# Patient Record
Sex: Female | Born: 1935
Health system: Southern US, Community
[De-identification: ages and names within clinical notes are randomized; demographics above are authoritative.]

## PROBLEM LIST (undated history)

## (undated) DIAGNOSIS — J45909 Unspecified asthma, uncomplicated: Secondary | ICD-10-CM

## (undated) DIAGNOSIS — I639 Cerebral infarction, unspecified: Secondary | ICD-10-CM

## (undated) DIAGNOSIS — H3530 Unspecified macular degeneration: Secondary | ICD-10-CM

## (undated) DIAGNOSIS — E119 Type 2 diabetes mellitus without complications: Secondary | ICD-10-CM

## (undated) DIAGNOSIS — J449 Chronic obstructive pulmonary disease, unspecified: Secondary | ICD-10-CM

## (undated) DIAGNOSIS — I10 Essential (primary) hypertension: Secondary | ICD-10-CM

## (undated) HISTORY — PX: ABDOMINAL HYSTERECTOMY: SHX81

## (undated) HISTORY — PX: HIP ARTHROPLASTY: SHX981

---

## 2003-12-23 ENCOUNTER — Ambulatory Visit: Payer: Self-pay | Admitting: Internal Medicine

## 2004-04-21 ENCOUNTER — Ambulatory Visit: Payer: Self-pay | Admitting: Urology

## 2004-11-09 ENCOUNTER — Ambulatory Visit: Payer: Self-pay | Admitting: Urology

## 2004-11-16 ENCOUNTER — Ambulatory Visit: Payer: Self-pay | Admitting: Unknown Physician Specialty

## 2004-12-31 ENCOUNTER — Ambulatory Visit: Payer: Self-pay | Admitting: Internal Medicine

## 2005-07-14 ENCOUNTER — Ambulatory Visit: Payer: Self-pay | Admitting: Urology

## 2005-09-27 ENCOUNTER — Ambulatory Visit: Payer: Self-pay | Admitting: Urology

## 2005-09-30 ENCOUNTER — Ambulatory Visit: Payer: Self-pay | Admitting: Urology

## 2005-09-30 ENCOUNTER — Other Ambulatory Visit: Payer: Self-pay

## 2005-10-07 ENCOUNTER — Ambulatory Visit: Payer: Self-pay | Admitting: Urology

## 2005-10-20 ENCOUNTER — Ambulatory Visit: Payer: Self-pay | Admitting: Urology

## 2006-01-10 ENCOUNTER — Ambulatory Visit: Payer: Self-pay | Admitting: Internal Medicine

## 2006-01-21 ENCOUNTER — Ambulatory Visit: Payer: Self-pay | Admitting: Urology

## 2006-01-26 ENCOUNTER — Ambulatory Visit: Payer: Self-pay | Admitting: Internal Medicine

## 2006-07-20 ENCOUNTER — Ambulatory Visit: Payer: Self-pay | Admitting: Urology

## 2006-07-20 ENCOUNTER — Ambulatory Visit: Payer: Self-pay | Admitting: Specialist

## 2006-11-10 IMAGING — CR DG ABDOMEN 1V
1 series · 1 of 1 positions shown · non-contrast
Comparison: none

REASON FOR EXAM: NEPHROLITHIASIS PT NEED FILMS
COMMENTS:

[view not recorded]
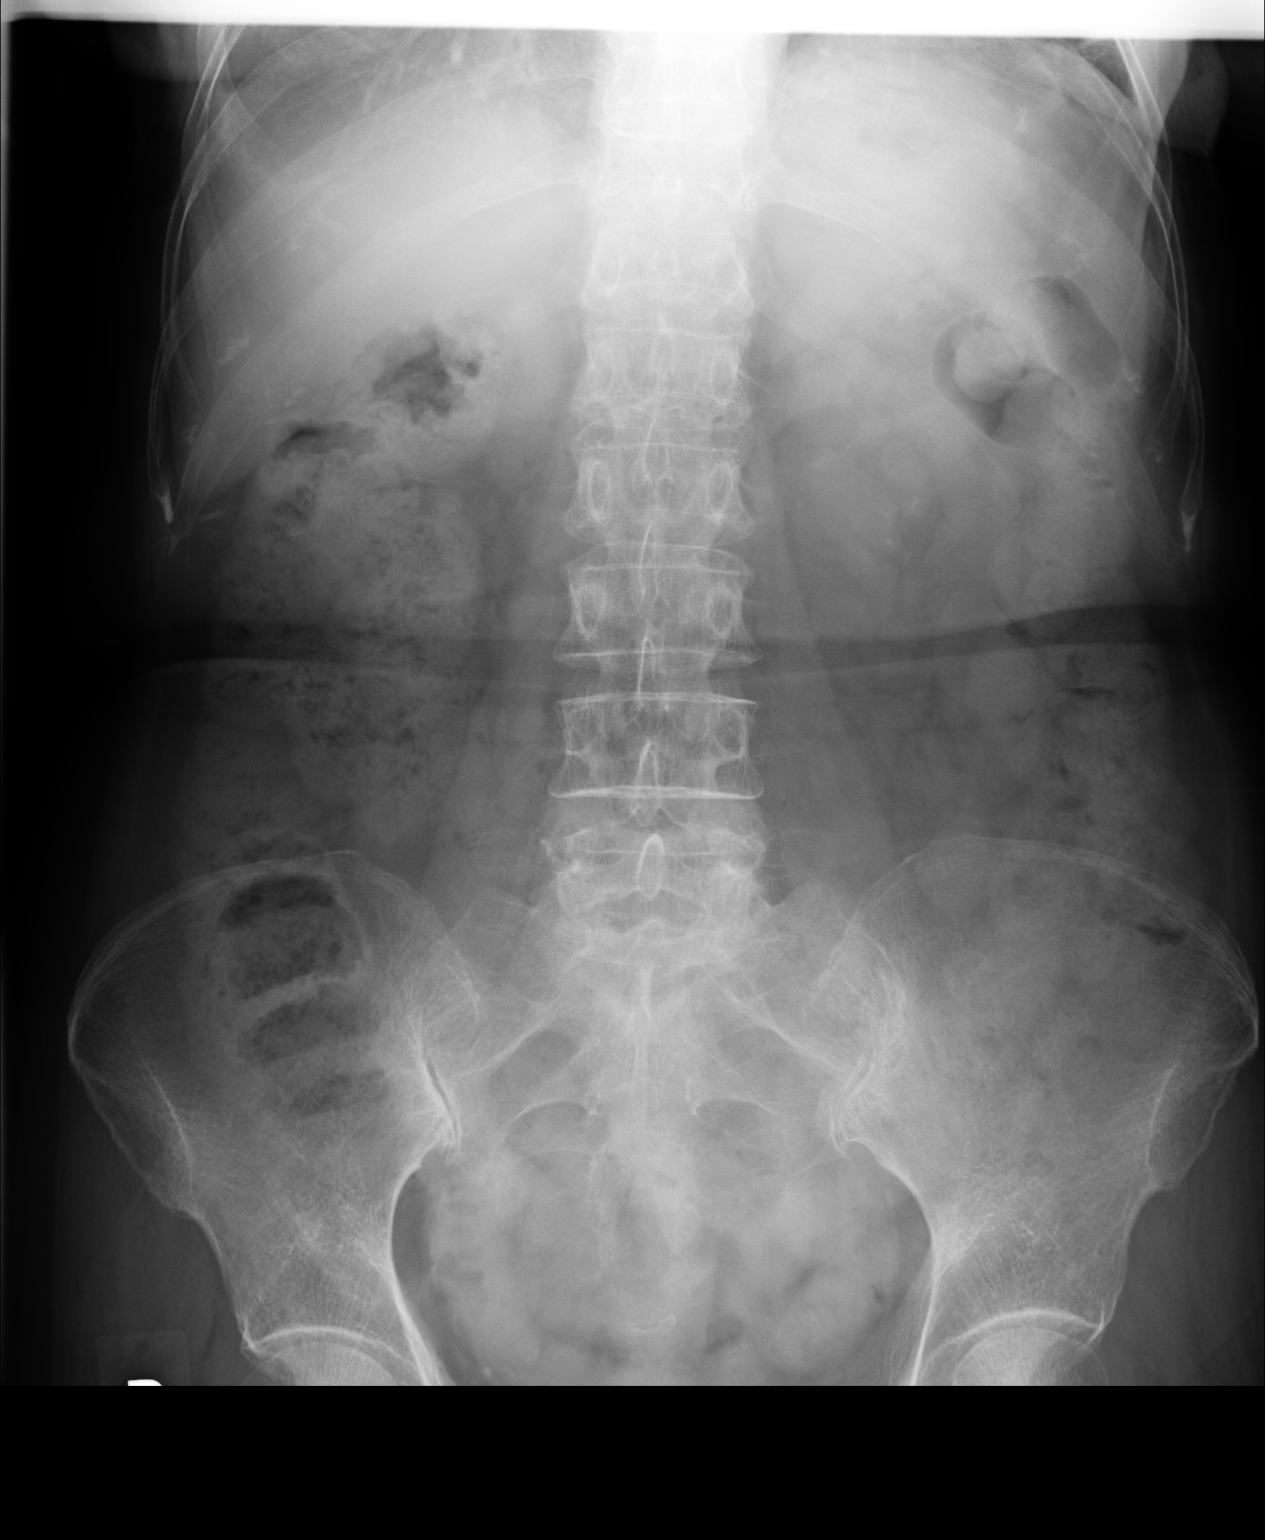

[1 of 1 positions shown; findings below may reference images not displayed]

PROCEDURE:     DXR - DXR KIDNEY URETER BLADDER  - October 20, 2005  [DATE]

RESULT:         The current exam is compared to a prior exam of 10/07/05.
The previously noted calcification at the lower pole of the LEFT kidney is
no longer seen.  There are noted a few sand-like calcifications at this time
presumably representing fragments from interval lithotripsy.   No other
calcifications are seen.  A few phleboliths are noted in the pelvis.
IMPRESSION: There are noted a few sand-like calcific densities at the lower pole of the
LEFT kidney.  No other calcifications are identified.

## 2006-12-21 ENCOUNTER — Emergency Department: Payer: Self-pay | Admitting: Emergency Medicine

## 2007-04-20 ENCOUNTER — Ambulatory Visit: Payer: Self-pay | Admitting: Urology

## 2007-05-17 ENCOUNTER — Ambulatory Visit: Payer: Self-pay | Admitting: Urology

## 2007-05-22 ENCOUNTER — Other Ambulatory Visit: Payer: Self-pay

## 2007-05-22 ENCOUNTER — Ambulatory Visit: Payer: Self-pay | Admitting: Urology

## 2007-05-23 ENCOUNTER — Ambulatory Visit: Payer: Self-pay | Admitting: Urology

## 2007-06-09 ENCOUNTER — Inpatient Hospital Stay: Payer: Self-pay | Admitting: Urology

## 2007-07-20 ENCOUNTER — Ambulatory Visit: Payer: Self-pay | Admitting: Urology

## 2008-01-29 ENCOUNTER — Ambulatory Visit: Payer: Self-pay | Admitting: Urology

## 2008-03-20 ENCOUNTER — Inpatient Hospital Stay: Payer: Self-pay | Admitting: Specialist

## 2008-05-30 ENCOUNTER — Ambulatory Visit: Payer: Self-pay | Admitting: Gastroenterology

## 2008-06-25 ENCOUNTER — Ambulatory Visit: Payer: Self-pay | Admitting: Gastroenterology

## 2008-08-22 ENCOUNTER — Ambulatory Visit: Payer: Self-pay | Admitting: Urology

## 2009-03-31 ENCOUNTER — Ambulatory Visit: Payer: Self-pay | Admitting: Specialist

## 2009-04-09 ENCOUNTER — Emergency Department: Payer: Self-pay | Admitting: Internal Medicine

## 2009-04-12 IMAGING — RF DG UGI W/ SMALL BOWEL
1 series · 14 of 24 positions shown · non-contrast
Comparison: none

REASON FOR EXAM: GI blood loss, abnormal proximal duodenum.
COMMENTS:

[Series 1: run · 14 of 40 slices shown]
[im 1/40]
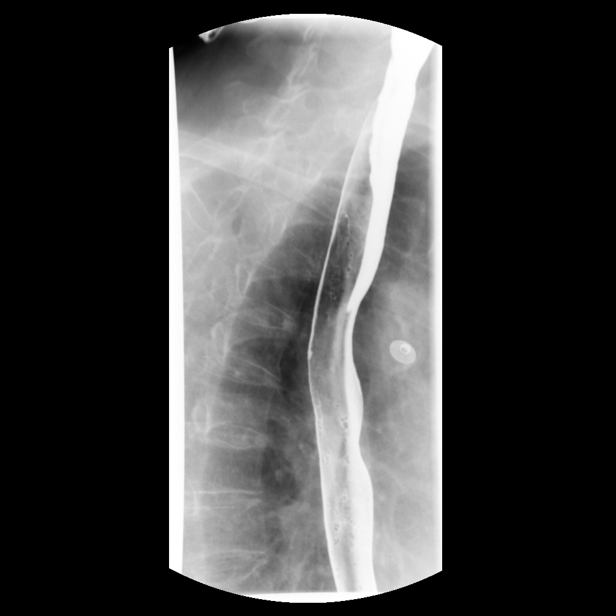
[im 4/40]
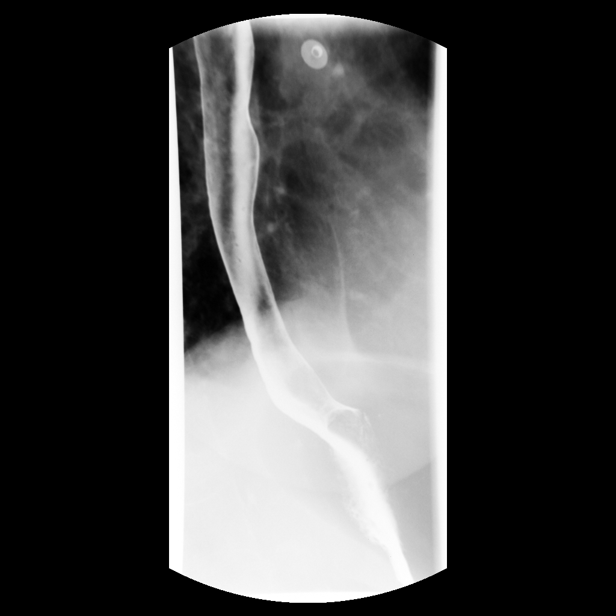
[im 7/40]
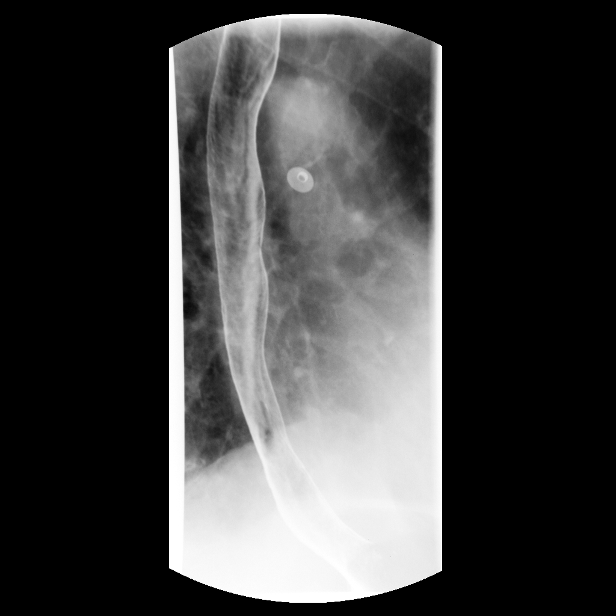
[im 11/40]
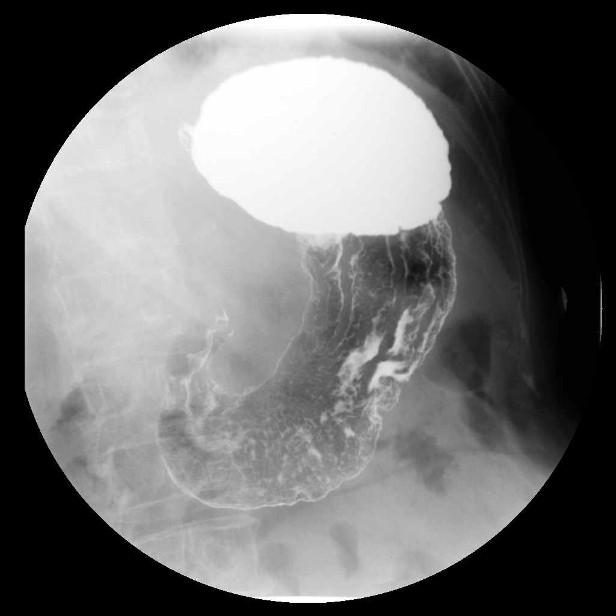
[im 12/40]
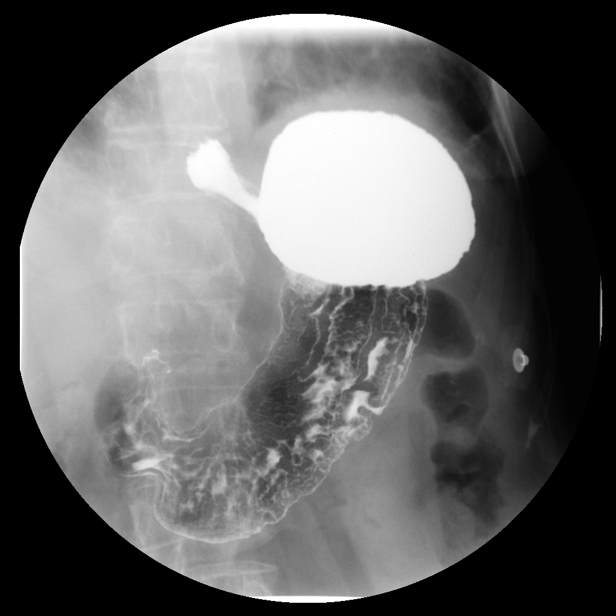
[im 16/40]
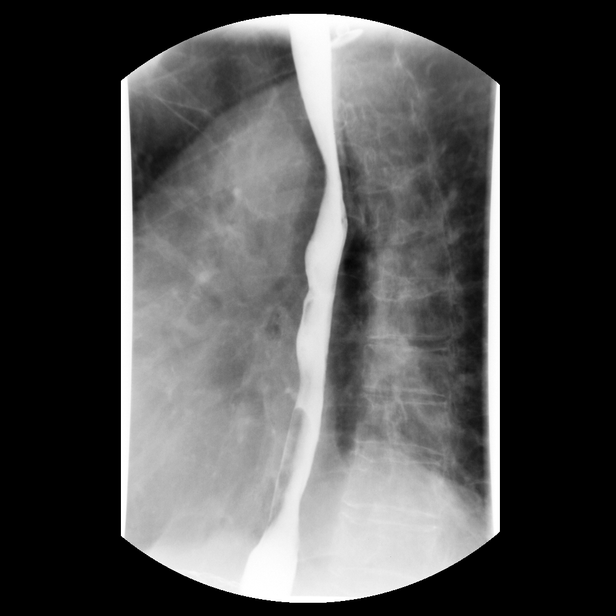
[im 19/40]
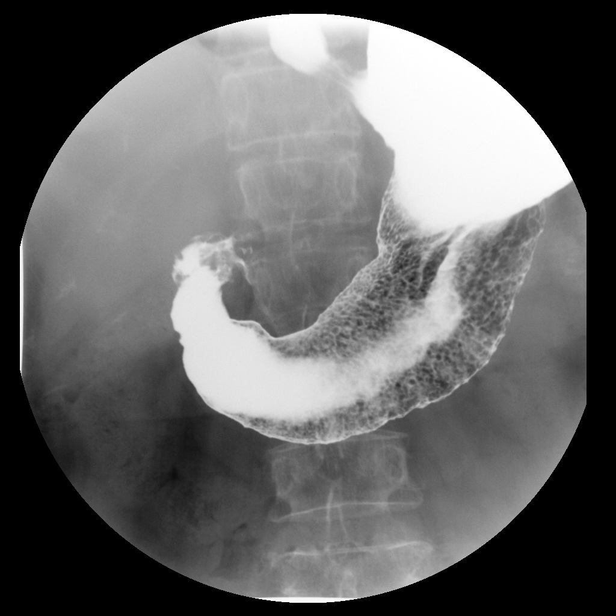
[im 21/40]
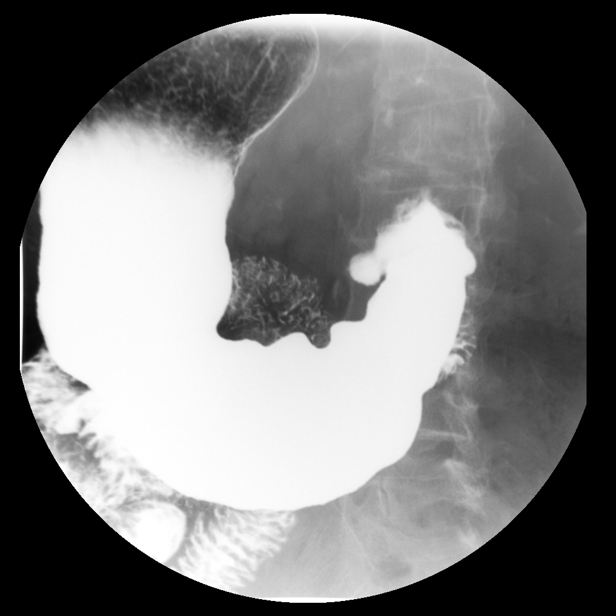
[im 24/40]
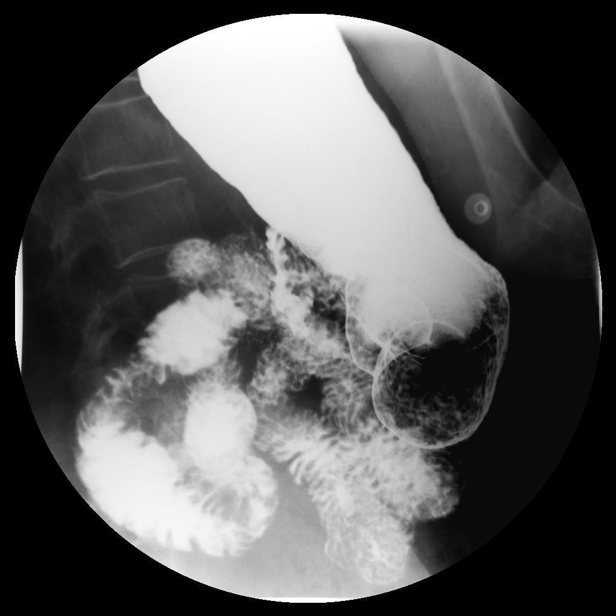
[im 28/40]
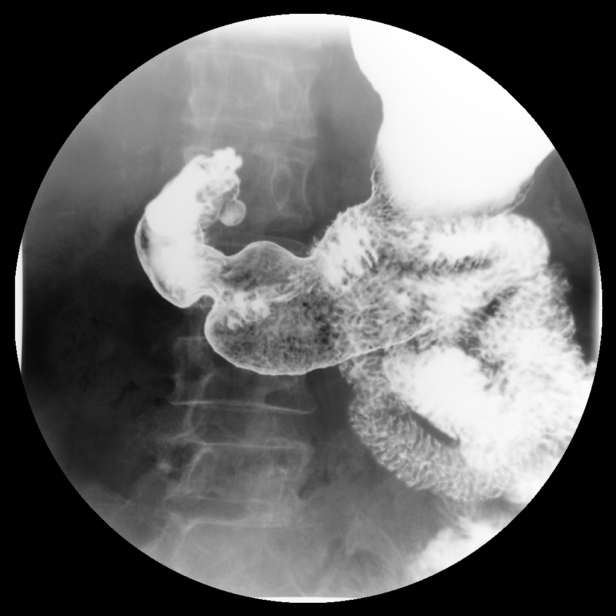
[im 31/40]
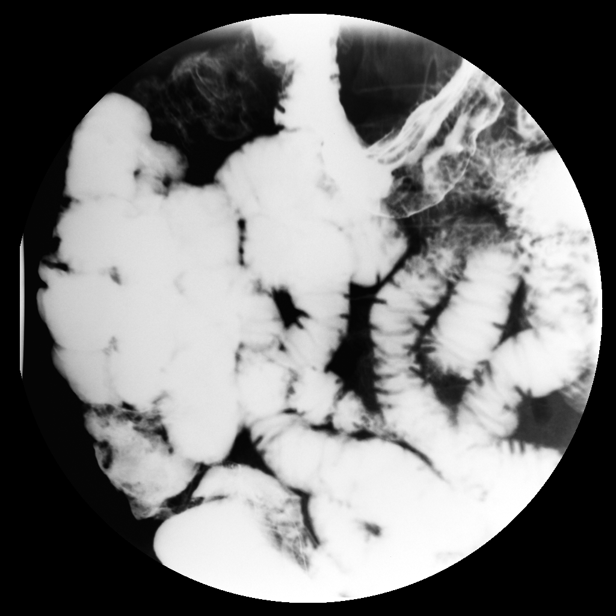
[im 33/40]
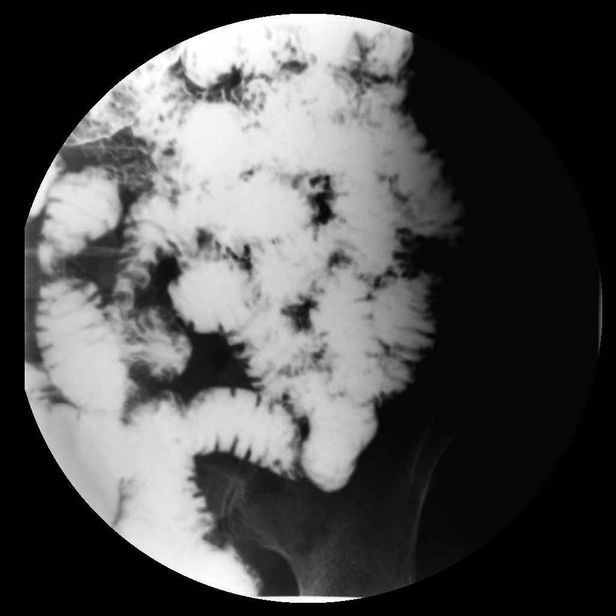
[im 36/40]
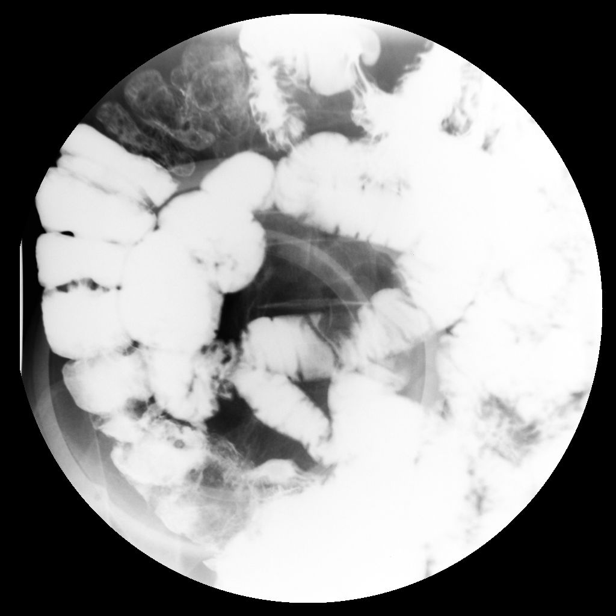
[im 40/40]
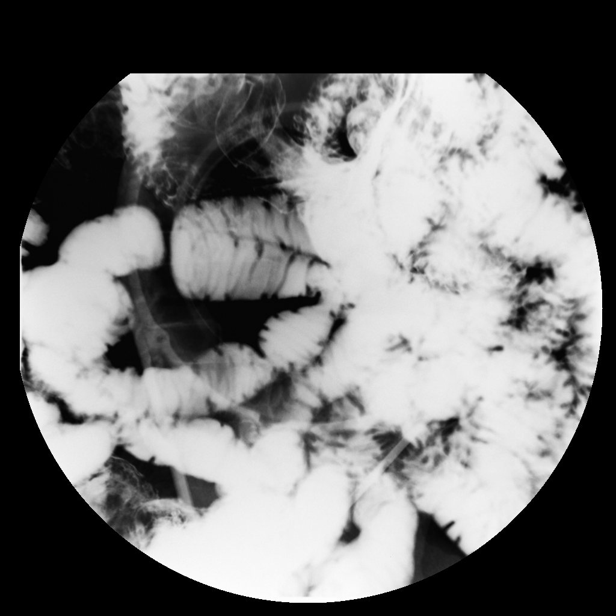

[14 of 24 positions shown; findings below may reference images not displayed]

PROCEDURE:     FL  - FL UPPER GI WITH SMALL BOWEL  - March 22, 2008 [DATE]

RESULT:        An upper GI evaluation was performed status post
administration of effervescent crystals followed by oral administration of
barium.  Limited evaluation of the esophagus demonstrates no gross
abnormalities.  There is appropriate relaxation of the lower esophageal
sphincter.  The stomach and duodenum demonstrate no gross abnormalities.  A
focal outpouching is identified along the medial posterior aspect of the
duodenal bulb.  This has a smoothly marginated border and has the appearance
of a diverticulum.  A small ulcer is also a diagnostic consideration.  There
is no further focal abnormalities identified within the duodenum. There is
appropriate rotation of the duodenum and placement of the ligament of
Treitz.  The oral barium column was then followed through the small bowel.
Transient time to the terminal ileum is 30 minutes.  Focused quadrant
evaluation of the small bowel demonstrates no radiographic abnormalities.
Evaluation of the terminal ileum demonstrates appropriate distention and
contraction without focal abnormalities.
IMPRESSION: 1.     Findings likely representing a small diverticulum involving the
duodenal bulb.  An encapsulated ulcer cannot be completely excluded.
2.     No further abnormalities are identified.

## 2009-04-14 ENCOUNTER — Ambulatory Visit: Payer: Self-pay | Admitting: Gastroenterology

## 2009-08-18 ENCOUNTER — Ambulatory Visit: Payer: Self-pay | Admitting: Urology

## 2010-04-15 ENCOUNTER — Ambulatory Visit: Payer: Self-pay | Admitting: Specialist

## 2012-10-03 ENCOUNTER — Ambulatory Visit: Payer: Self-pay | Admitting: Urology

## 2012-11-09 ENCOUNTER — Ambulatory Visit: Payer: Self-pay

## 2013-08-16 ENCOUNTER — Inpatient Hospital Stay: Payer: Self-pay | Admitting: Specialist

## 2013-08-16 LAB — CBC
HCT: 30.2 % — ABNORMAL LOW (ref 35.0–47.0)
HGB: 9.9 g/dL — ABNORMAL LOW (ref 12.0–16.0)
MCH: 30.5 pg (ref 26.0–34.0)
MCHC: 32.8 g/dL (ref 32.0–36.0)
MCV: 93 fL (ref 80–100)
PLATELETS: 171 10*3/uL (ref 150–440)
RBC: 3.25 10*6/uL — ABNORMAL LOW (ref 3.80–5.20)
RDW: 15 % — ABNORMAL HIGH (ref 11.5–14.5)
WBC: 13.6 10*3/uL — ABNORMAL HIGH (ref 3.6–11.0)

## 2013-08-16 LAB — BASIC METABOLIC PANEL
ANION GAP: 9 (ref 7–16)
BUN: 15 mg/dL (ref 7–18)
CALCIUM: 8.4 mg/dL — AB (ref 8.5–10.1)
CO2: 27 mmol/L (ref 21–32)
Chloride: 105 mmol/L (ref 98–107)
Creatinine: 0.71 mg/dL (ref 0.60–1.30)
EGFR (African American): >60
EGFR (Non-African Amer.): >60
Glucose: 343 mg/dL — ABNORMAL HIGH (ref 65–99)
Osmolality: 296 (ref 275–301)
POTASSIUM: 3.8 mmol/L (ref 3.5–5.1)
Sodium: 141 mmol/L (ref 136–145)

## 2013-08-17 LAB — BASIC METABOLIC PANEL
ANION GAP: 9 (ref 7–16)
BUN: 15 mg/dL (ref 7–18)
CALCIUM: 8 mg/dL — AB (ref 8.5–10.1)
CREATININE: 0.56 mg/dL — AB (ref 0.60–1.30)
Chloride: 106 mmol/L (ref 98–107)
Co2: 28 mmol/L (ref 21–32)
Glucose: 208 mg/dL — ABNORMAL HIGH (ref 65–99)
OSMOLALITY: 292 (ref 275–301)
POTASSIUM: 4.3 mmol/L (ref 3.5–5.1)
SODIUM: 143 mmol/L (ref 136–145)

## 2013-08-17 LAB — URINALYSIS, COMPLETE
BILIRUBIN, UR: NEGATIVE
BLOOD: NEGATIVE
Glucose,UR: >=500 mg/dL (ref 0–75)
Leukocyte Esterase: NEGATIVE
NITRITE: NEGATIVE
Ph: 6 (ref 4.5–8.0)
Protein: NEGATIVE
Specific Gravity: 1.015 (ref 1.003–1.030)
Transitional Epi: 1
WBC UR: 3 /HPF (ref 0–5)

## 2013-08-17 LAB — CBC WITH DIFFERENTIAL/PLATELET
BASOS ABS: 0 10*3/uL (ref 0.0–0.1)
BASOS PCT: 0.3 %
EOS PCT: 0 %
Eosinophil #: 0 10*3/uL (ref 0.0–0.7)
HCT: 23.9 % — ABNORMAL LOW (ref 35.0–47.0)
HGB: 7.7 g/dL — AB (ref 12.0–16.0)
LYMPHS ABS: 0.7 10*3/uL — AB (ref 1.0–3.6)
LYMPHS PCT: 8.4 %
MCH: 30.1 pg (ref 26.0–34.0)
MCHC: 32.1 g/dL (ref 32.0–36.0)
MCV: 94 fL (ref 80–100)
MONOS PCT: 10.7 %
Monocyte #: 0.9 x10 3/mm (ref 0.2–0.9)
NEUTROS ABS: 6.6 10*3/uL — AB (ref 1.4–6.5)
Neutrophil %: 80.6 %
Platelet: 126 10*3/uL — ABNORMAL LOW (ref 150–440)
RBC: 2.55 10*6/uL — ABNORMAL LOW (ref 3.80–5.20)
RDW: 14.9 % — ABNORMAL HIGH (ref 11.5–14.5)
WBC: 8.2 10*3/uL (ref 3.6–11.0)

## 2013-08-17 LAB — HEMOGLOBIN: HGB: 7.6 g/dL — AB (ref 12.0–16.0)

## 2013-08-17 LAB — MAGNESIUM: Magnesium: 1.5 mg/dL — ABNORMAL LOW

## 2013-08-18 LAB — BASIC METABOLIC PANEL
Anion Gap: 8 (ref 7–16)
BUN: 9 mg/dL (ref 7–18)
CALCIUM: 7.4 mg/dL — AB (ref 8.5–10.1)
CHLORIDE: 105 mmol/L (ref 98–107)
Co2: 25 mmol/L (ref 21–32)
Creatinine: 0.55 mg/dL — ABNORMAL LOW (ref 0.60–1.30)
EGFR (Non-African Amer.): >60
GLUCOSE: 276 mg/dL — AB (ref 65–99)
Osmolality: 284 (ref 275–301)
POTASSIUM: 3.7 mmol/L (ref 3.5–5.1)
Sodium: 138 mmol/L (ref 136–145)

## 2013-08-18 LAB — HEMOGLOBIN
HGB: 8.1 g/dL — ABNORMAL LOW (ref 12.0–16.0)
HGB: 6.5 g/dL — AB (ref 12.0–16.0)

## 2013-08-18 LAB — PLATELET COUNT: PLATELETS: 158 10*3/uL (ref 150–440)

## 2013-08-19 LAB — BASIC METABOLIC PANEL
Anion Gap: 8 (ref 7–16)
BUN: 10 mg/dL (ref 7–18)
CALCIUM: 7.2 mg/dL — AB (ref 8.5–10.1)
CHLORIDE: 106 mmol/L (ref 98–107)
Co2: 24 mmol/L (ref 21–32)
Creatinine: 0.64 mg/dL (ref 0.60–1.30)
EGFR (African American): >60
Glucose: 160 mg/dL — ABNORMAL HIGH (ref 65–99)
Osmolality: 278 (ref 275–301)
Potassium: 3.8 mmol/L (ref 3.5–5.1)
Sodium: 138 mmol/L (ref 136–145)

## 2013-08-19 LAB — HEMOGLOBIN: HGB: 10.6 g/dL — ABNORMAL LOW (ref 12.0–16.0)

## 2013-08-19 LAB — PLATELET COUNT: Platelet: 136 10*3/uL — ABNORMAL LOW (ref 150–440)

## 2013-08-20 LAB — HEMOGLOBIN: HGB: 9.4 g/dL — ABNORMAL LOW (ref 12.0–16.0)

## 2013-10-15 ENCOUNTER — Emergency Department: Payer: Self-pay | Admitting: Emergency Medicine

## 2013-10-25 ENCOUNTER — Ambulatory Visit: Payer: Self-pay | Admitting: Nurse Practitioner

## 2014-04-26 ENCOUNTER — Emergency Department
Admit: 2014-04-26 | Disposition: A | Discharge status: Home / Self Care | Payer: Self-pay | Admitting: Emergency Medicine

## 2014-04-26 LAB — BASIC METABOLIC PANEL
Anion Gap: 9 (ref 7–16)
BUN: 15 mg/dL
Calcium, Total: 11.4 mg/dL — ABNORMAL HIGH
Chloride: 100 mmol/L — ABNORMAL LOW
Co2: 31 mmol/L
Creatinine: 0.82 mg/dL
EGFR (Non-African Amer.): >60
Glucose: 156 mg/dL — ABNORMAL HIGH
POTASSIUM: 3.8 mmol/L
Sodium: 140 mmol/L

## 2014-04-26 LAB — CBC WITH DIFFERENTIAL/PLATELET
BASOS PCT: 0.7 %
Basophil #: 0.1 10*3/uL (ref 0.0–0.1)
Eosinophil #: 0.3 10*3/uL (ref 0.0–0.7)
Eosinophil %: 3.7 %
HCT: 36.2 % (ref 35.0–47.0)
HGB: 12.3 g/dL (ref 12.0–16.0)
LYMPHS ABS: 1.3 10*3/uL (ref 1.0–3.6)
Lymphocyte %: 15.1 %
MCH: 31.5 pg (ref 26.0–34.0)
MCHC: 33.9 g/dL (ref 32.0–36.0)
MCV: 93 fL (ref 80–100)
MONO ABS: 1 x10 3/mm — AB (ref 0.2–0.9)
Monocyte %: 11.9 %
NEUTROS PCT: 68.6 %
Neutrophil #: 6 10*3/uL (ref 1.4–6.5)
Platelet: 146 10*3/uL — ABNORMAL LOW (ref 150–440)
RBC: 3.89 10*6/uL (ref 3.80–5.20)
RDW: 14 % (ref 11.5–14.5)
WBC: 8.7 10*3/uL (ref 3.6–11.0)

## 2014-04-26 LAB — URINALYSIS, COMPLETE
BACTERIA: NONE SEEN
BLOOD: NEGATIVE
Bilirubin,UR: NEGATIVE
Glucose,UR: NEGATIVE mg/dL (ref 0–75)
Ketone: NEGATIVE
Leukocyte Esterase: NEGATIVE
Nitrite: NEGATIVE
Ph: 8 (ref 4.5–8.0)
SPECIFIC GRAVITY: 1.012 (ref 1.003–1.030)
Squamous Epithelial: NONE SEEN

## 2014-04-26 LAB — TROPONIN I

## 2014-04-26 LAB — TSH: Thyroid Stimulating Horm: 1.799 u[IU]/mL

## 2014-04-27 NOTE — Discharge Summary (Signed)
PATIENT NAME:  Angela Neal, ETHELREDA SUKHU MR#:  [id number] DATE OF BIRTH:  [birth year]  DATE OF ADMISSION:  08/16/2013 DATE OF DISCHARGE:  08/20/2013  For a detailed note, please check with the history and physical done on admission by Dr. Max Sane.   PAST MEDICAL HISTORY: As follows: Status post mechanical fall and left hip fracture. Status post open reduction, internal fixation of the left hip. Sinus tachycardia secondary to orthostatic hypotension, now improved. Leukocytosis secondary to stress margination, diabetes, hypertension, hyperlipidemia, acute blood loss anemia.   DISPOSITION:  The patient is being discharged to a skilled nursing facility for ongoing rehab.   DISCHARGE DIET:  The patient is being discharged on a low-sodium, low-fat, American Diabetic Association diet.   ACTIVITY: As tolerated.  FOLLOWUP:  With Dr. Marry Guan in the next 1 to 2 weeks. Also follow up with Dr.  Salome Holmes in the next 1 to 2 weeks.  DISCHARGE MEDICATIONS:  Zoloft 50 mg daily, Lipitor 40 mg daily, glyburide 5 mg b.i.d., lisinopril 40 mg daily, Tandem F oral capsule 1 tab daily, amlodipine 2.5 mg daily, Flonase 2 sprays to each nostril daily, Singulair 10 mg daily, metformin 1000/Januvia 50 mg 1 tablet b.i.d. with meals, oxycodone 5 mg 1 tablet q. 4-6 hours as needed, tramadol 50 mg q. 4-6 hours as needed for pain too, and Lovenox 30 mg subcutaneous daily.   [place] COURSE: Laurice Record. Holley Bouche., MD, from orthopedics.   PERTINENT STUDIES DONE DURING THE HOSPITAL COURSE: As follows: An x-ray of the left hip showing left intertrochanteric fracture without dislocation.   HOSPITAL COURSE: This is an 81 year old female with medical problems as mentioned above, presented with a mechanical fall and noted to have a left hip fracture.    PROBLEMS AND PLAN: 1.  Status post mechanical fall and left hip fracture. The patient was cleared from the medical perspective and therefore underwent open  reduction, internal fixation of her left hip. She is currently postoperative day number 3 and is doing well with physical therapy. Her pain is well controlled with oral meds. She is therefore being discharged to an ongoing rehabilitation for further physical therapy post hip replacement.  2.  Leukocytosis.  I suspect this was stress margination due to her hip fracture. Her white cell count has now normalized. Her infectious workup, including a chest x-ray and urinalysis on admission, were negative. 3.  Sinus tachycardia. Some of this is probably related to pain, some of it from orthostatic hypotension. After the patient received some IV fluids and blood, her sinus tachycardia has improved.  4.  Diabetes. The patient's blood sugars have remained stable. She has had no evidence of hypoglycemia. She will continue her glyburide and metformin and Januvia as stated.  5.  Hypertension. The patient remained hemodynamically stable. She will continue her Norvasc and lisinopril.  6.  Hyperlipidemia. The patient was maintained on atorvastatin. She will resume that.  7.  Anemia. This was acute blood loss anemia secondary to her recent hip surgery. Her hemoglobin did go down as low as 6.5. She was transfused 2 units of packed red blood cells. Post transfusion, hemoglobin has remained stable. 8.  Orthostatic hypotension. This was likely secondary to volume loss from her acute blood loss anemia. She has been transfused and given IV fluids, and this has since improved and resolved now.   CODE STATUS: The patient is being discharged to a skilled nursing facility for ongoing rehabilitation.   TIME SPENT ON DISCHARGE: 40 minutes.  ____________________________ Belia Heman. Verdell Carmine, MD vjs:DT D: 08/20/2013 11:12:00 ET T: 08/20/2013 11:23:33 ET JOB#: [id number]  cc: Belia Heman. Verdell Carmine, MD, <Dictator> Laurice Record. Holley Bouche., MD Salome Holmes, MD Henreitta Leber MD ELECTRONICALLY SIGNED 08/21/2013 16:03

## 2014-04-27 NOTE — Consult Note (Signed)
Brief Consult Note: Diagnosis: Left intertrochanteric femur fracture.   Patient was seen by consultant.   Comments: Recommend ORIF. The risks and benefits of surgical intervention were discussed in detail with the patient. The patient expressed understanding of the risks and benefits and agreed with plans for surgery.  Surgical site signed as per "right site surgery" protocol.  Electronic Signatures: Dereck Leep (MD)  (Signed 13-Aug-15 22:43)  Authored: Brief Consult Note   Last Updated: 13-Aug-15 22:43 by Dereck Leep (MD)

## 2014-04-27 NOTE — Op Note (Signed)
PATIENT NAME:  Bottger, LADORIS LYTHGOE MR#:  [id number] DATE OF BIRTH:  [birth year]  DATE OF PROCEDURE:  08/17/2013  PREOPERATIVE DIAGNOSIS: Left intertrochanteric femur fracture.   POSTOPERATIVE DIAGNOSIS: Left intertrochanteric femur fracture.   PROCEDURE PERFORMED: Open reduction and internal fixation of left intertrochanteric femur fracture.   SURGEON: Laurice Record. Holley Bouche., MD   ANESTHESIA: Spinal.   ESTIMATED BLOOD LOSS: 50 mL.   FLUIDS REPLACED: 700 mL of crystalloid.   DRAINS: None.  IMPLANTS UTILIZED: Synthes 11 mm, 130-degree trochanteric fixation nail, 80 mm helical blade, and a 5 x 36 mm locking screw.  INDICATIONS FOR SURGERY: The patient is an 81 year old female who slipped and fell at home, landing on her left hip and side. X-rays demonstrated a displaced left intertrochanteric femur fracture. After discussion of the risks and benefits of surgical intervention, the patient expressed understanding of the risks and benefits, and agreed with plans for surgical intervention.   PROCEDURE IN DETAIL: The patient was brought into the operating room and, after adequate spinal anesthesia was achieved, the patient was transferred to the fracture table. All bony prominences were well padded and traction was applied to the left lower extremity. Provisional reduction was performed and visualized in both AP and lateral planes using the C-arm. The patient's left hip and leg were cleaned and prepped with alcohol and DuraPrep and draped in the usual sterile fashion. A "timeout" was performed as per usual protocol. A lateral longitudinal incision was made extending from the tip of the trochanter proximally. Fascia was incised, and fibers of the gluteus were split in line. Tip of the greater trochanter was identified and a distally threaded guide pin was inserted through the tip of the trochanter into the intramedullary canal. Position was confirmed in both AP and lateral planes using the C-arm. A step drill  was used to enlarge the entry site. An 11 mm, 130-degree trochanteric fixation nail was then advanced over the guidewire, and good position was noted. Guidewire was removed. A second stab incision was made along the lateral aspect of the hip and a tissue protector and guide were advanced through the outrigger device to the lateral cortex of the femur. Distally threaded guide pin was inserted into the femoral neck and head, with good position noted in both AP and lateral planes. Measurements were obtained and it was felt that an 80 mm helical blade was appropriate. Lateral cortex was reamed, and a cannulated reamer was advanced over the guide pin to accommodate the 80 mm helical blade. The 80 mm helical blade was placed over the guidewire and impacted into place. Good purchase was appreciated and good position was noted in both AP and lateral planes. Guidewire was removed. Proximal locking sleeve was engaged. Next, a third stab incision was made and guide was placed through the outrigger device to the lateral cortex for placement of the distal locking screw. A 5 x 36 mm locking screw was inserted. Position was confirmed in multiple planes. Outrigger device was removed. The wounds were irrigated with copious amounts of normal saline with antibiotic solution and then suctioned dry. Good hemostasis was appreciated. Fascia was reapproximated using interrupted sutures of #1 Vicryl. The subcutaneous tissue was approximated in layers using first #0 Vicryl followed by #2-0 Vicryl. Skin was closed with skin staples. Sterile dressing was applied.  The patient tolerated the procedure well. She was transported to the recovery room in stable condition.   ____________________________ Laurice Record. Holley Bouche., MD jph:ST D: 08/18/2013 97:67:34 ET T: 08/18/2013  22:19:08 ET JOB#: [id number]  cc: Laurice Record. Holley Bouche., MD, <Dictator> JAMES P Holley Bouche MD ELECTRONICALLY SIGNED 08/20/2013 7:25

## 2014-04-27 NOTE — H&P (Signed)
PATIENT NAME:  Angela Neal, Angela Neal MR#:  [id number] DATE OF BIRTH:  September 04, 1921  DATE OF ADMISSION:  08/16/2013  PRIMARY CARE PHYSICIAN:  Sharyne Peach, MD, at [facility]  REQUESTING PHYSICIAN:   Dr Conni Slipper   CHIEF COMPLAINT: Fall and left hip pain.   HISTORY OF PRESENT ILLNESS: The patient is an 81 year old female with a known history of hypertension, diabetes, and peptic ulcer disease; is being admitted for hip fracture, status post fall. The patient tripped over while picking up something and landed on the left side with severe left-sided hip pain. She denied any head injury, or hitting the head, denies any loss of consciousness. She was brought down to the Emergency Department. While in the ED, she underwent left hip x-ray, which showed comminuted left intertrochanteric fracture without dislocation, and is being admitted for further evaluation and management.   Her pain was 5 out of 10, in severity when she came in now about it is about 6 to 7 out of 10. She denies any other symptoms at this time.   PAST MEDICAL HISTORY: 1.  Hypertension.  2.  Diabetes.  3.  Hyperlipidemia.  4.  Depression.  5.  History of peptic ulcer disease.  6.  History of SVT.  7.  Diverticulosis.  8.  Macular degeneration.  9.  Chronic insomnia.  10.  Recurrent urolithiasis.   PAST SURGICAL HISTORY:  1.  Hysterectomy.  2.  Appendectomy.  3.  Bladder biopsy.  4.  Shockwave lithotripsy.  5.  Ureteroscopy.   ALLERGIES: SULFA AND CODEINE.   FAMILY HISTORY: Mother with coronary disease, she also had a cerebral hemorrhage; father died of MI at age of 30.   SOCIAL HISTORY: She is a former smoker, stopped in 1955. She is widowed; denies any alcohol, or smoking at this time.   MEDICATIONS AT HOME: 1.  Amlodipine 2.5 mg p.o. daily.  2.  Flonase 2 sprays intranasally daily.  3.  Glyburide 5 mg p.o. b.i.d.  4.  Lipitor 40 mg p.o. at bedtime.  5.  Lisinopril 40 mg p.o. daily.  6.   Metformin/sitagliptin 1000/50 mg 1 tablet p.o. b.i.d.  7.  Singulair once daily.  8.  Tandem once daily.  9.  Zoloft 50 mg p.o. daily.   REVIEW OF SYSTEMS:   CONSTITUTIONAL: No fever, fatigue, weakness.  EYES: No blurred or double vision.  ENT: No tinnitus, or ear pain.  RESPIRATORY: No cough, wheezing, hemoptysis.  CARDIOVASCULAR: No chest pain, orthopnea, edema.  GASTROINTESTINAL: No nausea, vomiting, diarrhea.  GENITOURINARY: No dysuria or hematuria.  ENDOCRINE: No polyuria, nocturia Hematology: no anemia, easy bruising or bleeding.  SKIN: No rash or lesion.  MUSCULOSKELETAL: History of arthritis, and now left hip pain.  NEUROLOGIC: No tingling, numbness, weakness.  PSYCHIATRIC: No history of anxiety or depression. She does have a history of chronic insomnia.   PHYSICAL EXAMINATION: VITAL SIGNS: Temperature 98.8, heart rate 133 per minute; respirations 16 per minute; blood pressure 111/80 mm hg, she is saturating 94%, on 2 liters oxygen via nasal cannula.  GENERAL: The patient is an 81 year old female lying in the bed comfortably without any acute distress.  EYES: Pupils equal, round, reactive to light, and accommodation, no scleral icterus, extraocular muscles intact.  HEENT: Head atraumatic, normocephalic, oropharynx, and nasopharynx clear.  NECK: Supple. No jugular venous distention. No thyroid enlargement or tednerness LUNGS: Clear to auscultation bilaterally. No wheezing, rales, rhonchi, or crepitation.  CARDIOVASCULAR: S1, S2 normal. No murmurs, rubs, or gallop.  ABDOMEN: Soft, nontender, nondistended. Bowel sounds present. No organomegaly or mass.  EXTREMITIES: No pedal edema, cyanosis, or clubbing.  NEUROLOGIC: Cranial nerves II through XII intact; muscle strength 5 out of 5 in all extremities except left hip, which was not checked as she does have significant tenderness around her left femoral neck area, sensation intact.  PSYCHIATRIC: The patient is alert, and oriented x  3.  SKIN: No obvious rash, lesion, or ulcer.  MUSCULOSKELETAL: No joint effusion, or tenderness. She does have some tenderness present on her left femur area around the femoral neck. Gait not checked.   LABORATORY PANEL:  Normal BMP. CBC showed white count of 13.6, hemoglobin 9.9, hematocrit 30.2, platelets 171,000.   Chest x-ray in the ED, showed no acute cardiopulmonary disease.  Left hip x-ray showed comminuted left intertrochanteric fracture without dislocation.   EKG shows normal sinus rhythm, sinus tachycardia at a rate of 132 per minute; no major ST-T changes.   IMPRESSION AND PLAN: 1.  Preoperative medical clearance. Patient is medically clear for planned surgery. She will be at moderate risk considering her advanced age, hypertension, diabetes. The case was discussed with Dr Clydell Hakim OR nurse as he is in the surgery and this has been updated.  2.  Comminuted left intertrochanteric fracture without dislocation status post fall. We will add pain medication for the plan per orthopedic. We will provide deep venous thrombosis prophylaxis with heparin subcutaneous at this time. Likely surgery in the morning depending on ortho evaluation.  3.  Leukocytosis. We will get urinalysis. A chest x-ray is clear. There is no reported fever. We will hold off any antibiotics at this time.  4.  Sinus tachycardia, likely due to pain. We will add pain medication. We will also metoprolol for rate control.  5.  Diabetes mellitus.  We will resume her home medication, most of her medications are oral, we will add sliding scale insulin; consult diabetic educators.  6.  Hypertension. We will resume her home medication.  7.  CODE STATUS: Full code.   TOTAL TIME TAKING CARE OF THIS PATIENT: Is 45 minutes.    ____________________________ Lucina Mellow. Manuella Ghazi, MD vss:nt D: 08/16/2013 21:12:35 ET T: 08/16/2013 21:35:12 ET JOB#: [id number]  cc: Jeni Duling S. Manuella Ghazi, MD, <Dictator> Salome Holmes, MD [place]  MD ELECTRONICALLY SIGNED 08/17/2013 12:41

## 2017-05-06 ENCOUNTER — Other Ambulatory Visit: Payer: Self-pay | Admitting: Specialist

## 2017-05-06 DIAGNOSIS — J8410 Pulmonary fibrosis, unspecified: Secondary | ICD-10-CM

## 2017-05-27 ENCOUNTER — Ambulatory Visit
Admission: RE | Admit: 2017-05-27 | Discharge: 2017-05-27 | Disposition: A | Discharge status: Home / Self Care | Payer: Medicare Other | Source: Ambulatory Visit | Attending: Specialist | Admitting: Specialist

## 2017-05-27 DIAGNOSIS — J8410 Pulmonary fibrosis, unspecified: Secondary | ICD-10-CM | POA: Insufficient documentation

## 2017-05-27 DIAGNOSIS — J90 Pleural effusion, not elsewhere classified: Secondary | ICD-10-CM | POA: Insufficient documentation

## 2017-05-27 DIAGNOSIS — D3501 Benign neoplasm of right adrenal gland: Secondary | ICD-10-CM | POA: Insufficient documentation

## 2017-05-27 DIAGNOSIS — I2510 Atherosclerotic heart disease of native coronary artery without angina pectoris: Secondary | ICD-10-CM | POA: Insufficient documentation

## 2017-05-27 DIAGNOSIS — S22009A Unspecified fracture of unspecified thoracic vertebra, initial encounter for closed fracture: Secondary | ICD-10-CM | POA: Insufficient documentation

## 2017-05-27 DIAGNOSIS — R918 Other nonspecific abnormal finding of lung field: Secondary | ICD-10-CM | POA: Insufficient documentation

## 2017-05-27 DIAGNOSIS — I700 Atherosclerosis of aorta: Secondary | ICD-10-CM | POA: Insufficient documentation

## 2017-05-27 DIAGNOSIS — I313 Pericardial effusion (noninflammatory): Secondary | ICD-10-CM | POA: Diagnosis not present

## 2017-05-27 DIAGNOSIS — D3502 Benign neoplasm of left adrenal gland: Secondary | ICD-10-CM | POA: Diagnosis not present

## 2017-05-27 DIAGNOSIS — K8020 Calculus of gallbladder without cholecystitis without obstruction: Secondary | ICD-10-CM | POA: Insufficient documentation

## 2017-05-27 DIAGNOSIS — J439 Emphysema, unspecified: Secondary | ICD-10-CM | POA: Diagnosis present

## 2017-05-27 DIAGNOSIS — R0602 Shortness of breath: Secondary | ICD-10-CM | POA: Insufficient documentation

## 2018-12-14 LAB — HM DEXA SCAN

## 2019-05-01 ENCOUNTER — Emergency Department
Admission: EM | Admit: 2019-05-01 | Discharge: 2019-05-01 | Disposition: A | Discharge status: Home / Self Care | Payer: Medicare Other | Attending: Emergency Medicine | Admitting: Emergency Medicine

## 2019-05-01 ENCOUNTER — Emergency Department: Payer: Medicare Other

## 2019-05-01 ENCOUNTER — Other Ambulatory Visit: Payer: Self-pay

## 2019-05-01 DIAGNOSIS — Z20822 Contact with and (suspected) exposure to covid-19: Secondary | ICD-10-CM | POA: Diagnosis not present

## 2019-05-01 DIAGNOSIS — E860 Dehydration: Secondary | ICD-10-CM | POA: Diagnosis not present

## 2019-05-01 DIAGNOSIS — Z794 Long term (current) use of insulin: Secondary | ICD-10-CM | POA: Diagnosis not present

## 2019-05-01 DIAGNOSIS — R4182 Altered mental status, unspecified: Secondary | ICD-10-CM | POA: Diagnosis present

## 2019-05-01 DIAGNOSIS — F0390 Unspecified dementia without behavioral disturbance: Secondary | ICD-10-CM | POA: Diagnosis not present

## 2019-05-01 DIAGNOSIS — E119 Type 2 diabetes mellitus without complications: Secondary | ICD-10-CM | POA: Insufficient documentation

## 2019-05-01 DIAGNOSIS — R5383 Other fatigue: Secondary | ICD-10-CM | POA: Diagnosis not present

## 2019-05-01 HISTORY — DX: Unspecified asthma, uncomplicated: J45.909

## 2019-05-01 HISTORY — DX: Cerebral infarction, unspecified: I63.9

## 2019-05-01 HISTORY — DX: Essential (primary) hypertension: I10

## 2019-05-01 HISTORY — DX: Type 2 diabetes mellitus without complications: E11.9

## 2019-05-01 HISTORY — DX: Unspecified macular degeneration: H35.30

## 2019-05-01 HISTORY — DX: Chronic obstructive pulmonary disease, unspecified: J44.9

## 2019-05-01 LAB — URINALYSIS, COMPLETE (UACMP) WITH MICROSCOPIC
Bacteria, UA: NONE SEEN
Bilirubin Urine: NEGATIVE
Glucose, UA: NEGATIVE mg/dL
Hgb urine dipstick: NEGATIVE
Ketones, ur: NEGATIVE mg/dL
Leukocytes,Ua: NEGATIVE
Nitrite: NEGATIVE
Protein, ur: NEGATIVE mg/dL
Specific Gravity, Urine: 1.011 (ref 1.005–1.030)
Squamous Epithelial / HPF: NONE SEEN (ref 0–5)
pH: 5 (ref 5.0–8.0)

## 2019-05-01 LAB — RESPIRATORY PANEL BY RT PCR (FLU A&B, COVID)
Influenza A by PCR: NEGATIVE
Influenza B by PCR: NEGATIVE
SARS Coronavirus 2 by RT PCR: NEGATIVE

## 2019-05-01 LAB — BASIC METABOLIC PANEL
Anion gap: 10 (ref 5–15)
BUN: 30 mg/dL — ABNORMAL HIGH (ref 8–23)
CO2: 23 mmol/L (ref 22–32)
Calcium: 8.7 mg/dL — ABNORMAL LOW (ref 8.9–10.3)
Chloride: 106 mmol/L (ref 98–111)
Creatinine, Ser: 0.87 mg/dL (ref 0.44–1.00)
GFR calc Af Amer: >60 mL/min (ref >60)
GFR calc non Af Amer: 57 mL/min — ABNORMAL LOW (ref >60)
Glucose, Bld: 127 mg/dL — ABNORMAL HIGH (ref 70–99)
Potassium: 3.2 mmol/L — ABNORMAL LOW (ref 3.5–5.1)
Sodium: 139 mmol/L (ref 135–145)

## 2019-05-01 LAB — CBC
HCT: 35.5 % — ABNORMAL LOW (ref 36.0–46.0)
Hemoglobin: 11.8 g/dL — ABNORMAL LOW (ref 12.0–15.0)
MCH: 31.1 pg (ref 26.0–34.0)
MCHC: 33.2 g/dL (ref 30.0–36.0)
MCV: 93.4 fL (ref 80.0–100.0)
Platelets: 139 10*3/uL — ABNORMAL LOW (ref 150–400)
RBC: 3.8 MIL/uL — ABNORMAL LOW (ref 3.87–5.11)
RDW: 14 % (ref 11.5–15.5)
WBC: 8.4 10*3/uL (ref 4.0–10.5)
nRBC: 0 % (ref 0.0–0.2)

## 2019-05-01 LAB — HEPATIC FUNCTION PANEL
ALT: 21 U/L (ref 0–44)
AST: 16 U/L (ref 15–41)
Albumin: 3.5 g/dL (ref 3.5–5.0)
Alkaline Phosphatase: 56 U/L (ref 38–126)
Bilirubin, Direct: 0.1 mg/dL (ref 0.0–0.2)
Indirect Bilirubin: 0.5 mg/dL (ref 0.3–0.9)
Total Bilirubin: 0.6 mg/dL (ref 0.3–1.2)
Total Protein: 6.9 g/dL (ref 6.5–8.1)

## 2019-05-01 LAB — MAGNESIUM: Magnesium: 1.9 mg/dL (ref 1.7–2.4)

## 2019-05-01 LAB — POC SARS CORONAVIRUS 2 AG: SARS Coronavirus 2 Ag: NEGATIVE

## 2019-05-01 LAB — LIPASE, BLOOD: Lipase: 31 U/L (ref 11–51)

## 2019-05-01 MED ORDER — SODIUM CHLORIDE 0.9 % IV BOLUS
1000.0000 mL | Freq: Once | INTRAVENOUS | Status: AC
  Administered 2019-05-01: 1000 mL via INTRAVENOUS

## 2019-05-01 MED ORDER — SODIUM CHLORIDE 0.9% FLUSH: 3.0000 mL | Freq: Once | INTRAVENOUS | Status: DC

## 2019-05-01 NOTE — ED Provider Notes (Signed)
[facility] Emergency Department Provider Note  ____________________________________________  Time seen: Approximately 6:25 PM  I have reviewed the triage vital signs and the nursing notes.   HISTORY  Chief Complaint Altered Mental Status    Level 5 Caveat: Portions of the History and Physical including HPI and review of systems are unable to be completely obtained due to patient being a poor historian   HPI Angela Neal is a 86 y.o. female with a history of diabetes COPD hypertension dementia who comes to the ED due to confusion for the past 2 days, decreased oral intake, decreased energy level.  Patient has chronic diarrhea for the past many months, chronic poor oral intake.  Has seen both of her doctors recently.  No recent changes to her medication regimen.  Her PCP has recommended she take Imodium for the diarrhea.  No fevers chills cough chest pain belly pain vomiting.  No known dysuria.  No melanotic stool.      Past Medical History:  Diagnosis Date  . Asthma   . COPD (chronic obstructive pulmonary disease) ([place])   . Diabetes mellitus without complication ([place])   . Hypertension   . Macular degeneration   . Stroke [facility])      There are no problems to display for this patient.    Past Surgical History:  Procedure Laterality Date  . ABDOMINAL HYSTERECTOMY    . HIP ARTHROPLASTY       Prior to Admission medications   Medication Sig Start Date End Date Taking? Authorizing Provider  albuterol (VENTOLIN HFA) 108 (90 Base) MCG/ACT inhaler Inhale 1-2 puffs into the lungs every 6 (six) hours as needed for wheezing or shortness of breath.   Yes [provider]  alendronate (FOSAMAX) 70 MG tablet Take 70 mg by mouth every Saturday.  04/03/19  Yes [provider]  amLODipine (NORVASC) 5 MG tablet Take 5 mg by mouth daily. 04/03/19  Yes [provider]  aspirin (ASPIR-LOW) 81 MG EC tablet Take 81 mg by mouth daily.   Yes  [provider]  atorvastatin (LIPITOR) 40 MG tablet Take 40 mg by mouth daily in the afternoon. 04/03/19  Yes [provider]  Cholecalciferol 25 MCG (1000 UT) tablet Take 1,000 Units by mouth daily. 01/09/19  Yes [provider]  fluticasone (FLONASE) 50 MCG/ACT nasal spray Place 1-2 sprays into both nostrils daily as needed for allergies or rhinitis.   Yes [provider]  JANUVIA 50 MG tablet Take 50 mg by mouth daily. 04/27/19  Yes [provider]  LEVEMIR FLEXTOUCH 100 UNIT/ML FlexPen Inject 7 Units into the skin at bedtime. 03/21/19  Yes [provider]  losartan (COZAAR) 100 MG tablet Take 100 mg by mouth daily. 04/03/19  Yes [provider]  metFORMIN (GLUCOPHAGE) 1000 MG tablet Take 1,000 mg by mouth daily. 04/27/19  Yes [provider]  montelukast (SINGULAIR) 10 MG tablet Take 10 mg by mouth at bedtime. 04/03/19  Yes [provider]  Multiple Vitamins-Minerals (GNP HEALTHY EYES SUPERVISION 2) CAPS Take 1 capsule by mouth in the morning and at bedtime.   Yes [provider]  NOVOLOG FLEXPEN 100 UNIT/ML FlexPen Inject 4 Units into the skin 3 (three) times daily before meals. (plus up to 5u sliding scale correction) 03/21/19  Yes [provider]  sertraline (ZOLOFT) 50 MG tablet Take 75 mg by mouth at bedtime. 04/04/19  Yes [provider]     Allergies Sulfa antibiotics   No  family history on file.  Social History Social History   Tobacco Use  . Smoking status: Former Research scientist (life sciences)  . Smokeless tobacco: Never Used  Substance Use Topics  . Alcohol use: Never  . Drug use: Never    Review of Systems Level 5 Caveat: Portions of the History and Physical including HPI and review of systems are unable to be completely obtained due to patient being a poor historian   Constitutional:   No known fever.  ENT:   No rhinorrhea. Cardiovascular:   No chest pain or syncope. Respiratory:   No  dyspnea or cough. Gastrointestinal:   Negative for abdominal pain, vomiting and diarrhea.  Musculoskeletal:   Negative for focal pain or swelling ____________________________________________   PHYSICAL EXAM:  VITAL SIGNS: ED Triage Vitals [05/01/19 1351]  Enc Vitals Group     BP 136/63     Pulse Rate 73     Resp 18     Temp (!) 97.5 F (36.4 C)     Temp src      SpO2 100 %     Weight 87 lb (39.5 kg)     Height 5' (1.524 m)     Head Circumference      Peak Flow      Pain Score 0     Pain Loc      Pain Edu?      Excl. in [place]?     Vital signs reviewed, nursing assessments reviewed.   Constitutional:   Alert and oriented to self. Non-toxic appearance. Eyes:   Conjunctivae are normal. EOMI. PERRL. ENT      Head:   Normocephalic and atraumatic.      Nose:   No congestion/rhinnorhea.       Mouth/Throat:   Slightly dry mucous membranes, no pharyngeal erythema. No peritonsillar mass.       Neck:   No meningismus. Full ROM. Hematological/Lymphatic/Immunilogical:   No cervical lymphadenopathy. Cardiovascular:   RRR. Symmetric bilateral radial and DP pulses.  No murmurs. Cap refill less than 2 seconds. Respiratory:   Normal respiratory effort without tachypnea/retractions. Breath sounds are clear and equal bilaterally. No wheezes/rales/rhonchi. Gastrointestinal:   Soft and nontender. Non distended. There is no CVA tenderness.  No rebound, rigidity, or guarding.  Musculoskeletal:   Normal range of motion in all extremities. No joint effusions.  No lower extremity tenderness.  No edema. Neurologic:   Minimal verbal expression.  Motor grossly intact. No acute focal neurologic deficits are appreciated.  Skin:    Skin is warm, dry and intact. No rash noted.  No petechiae, purpura, or bullae.  ____________________________________________    LABS (pertinent positives/negatives) (all labs ordered are listed, but only abnormal results are displayed) Labs Reviewed  BASIC METABOLIC  PANEL - Abnormal; Notable for the following components:      Result Value   Potassium 3.2 (*)    Glucose, Bld 127 (*)    BUN 30 (*)    Calcium 8.7 (*)    GFR calc non Af Amer 57 (*)    All other components within normal limits  CBC - Abnormal; Notable for the following components:   RBC 3.80 (*)    Hemoglobin 11.8 (*)    HCT 35.5 (*)    Platelets 139 (*)    All other components within normal limits  URINALYSIS, COMPLETE (UACMP) WITH MICROSCOPIC - Abnormal; Notable for the following components:   Color, Urine YELLOW (*)    APPearance HAZY (*)    All other  components within normal limits  RESPIRATORY PANEL BY RT PCR (FLU A&B, COVID)  GASTROINTESTINAL PANEL BY PCR, STOOL (REPLACES STOOL CULTURE)  C DIFFICILE QUICK SCREEN W PCR REFLEX  HEPATIC FUNCTION PANEL  LIPASE, BLOOD  MAGNESIUM  CBG MONITORING, ED  POC SARS CORONAVIRUS 2 AG -  ED  POC SARS CORONAVIRUS 2 AG   ____________________________________________   EKG  Interpreted by me Normal sinus rhythm rate of 73, normal axis and intervals.  Normal QRS ST segments and T waves.  No acute ischemic changes.  ____________________________________________    [id number]  DG Chest Portable 1 View  Result Date: 05/01/2019 CLINICAL DATA:  Fifth CT can confusion. EXAM: PORTABLE CHEST 1 VIEW COMPARISON:  Chest CT 05/27/2017. Chest radiograph 04/26/2014 FINDINGS: Low lung volumes. Mild cardiomegaly. Unchanged mediastinal contours with aortic atherosclerosis. Interstitial coarsening which appears chronic. Minor left lung base atelectasis/scarring. No confluent airspace disease. No pulmonary edema. No significant pleural effusion. No pneumothorax. Probable bone infarct in the left proximal humerus, partially included. No acute osseous abnormalities are seen. IMPRESSION: 1. Low lung volumes with left lung base atelectasis/scarring. 2. Mild cardiomegaly. Aortic Atherosclerosis (ICD10-I70.0). Electronically Signed   By: Keith Rake M.D.    On: 05/01/2019 16:22    ____________________________________________   PROCEDURES Procedures  ____________________________________________  DIFFERENTIAL DIAGNOSIS   UTI, pneumonia, dehydration, electrolyte abnormality  CLINICAL IMPRESSION / ASSESSMENT AND PLAN / ED COURSE  Medications ordered in the ED: Medications  sodium chloride flush (NS) 0.9 % injection 3 mL (has no administration in time range)  sodium chloride 0.9 % bolus 1,000 mL (1,000 mLs Intravenous New Bag/Given 05/01/19 1607)    Pertinent labs & imaging results that were available during my care of the patient were reviewed by me and considered in my medical decision making (see chart for details).   SUNNY KORFHAGE was evaluated in Emergency Department on 05/01/2019 for the symptoms described in the history of present illness. She was evaluated in the context of the global COVID-19 pandemic, which necessitated consideration that the patient might be at risk for infection with the SARS-CoV-2 virus that causes COVID-19. Institutional protocols and algorithms that pertain to the evaluation of patients at risk for COVID-19 are in a state of rapid change based on information released by regulatory bodies including the CDC and federal and state organizations. These policies and algorithms were followed during the patient's care in the ED.   Patient with dementia comes the ED due to decreased energy level in the setting of chronic poor oral intake and chronic diarrhea.  Most likely dehydration.  We will undertake a screening work-up given patient's inability to relate any symptoms.  No evidence of trauma, doubt intracranial hemorrhage, stroke, ACS PE dissection AAA or acute abdomen.  ----------------------------------------- 6:27 PM on 05/01/2019 -----------------------------------------  Work-up reassuring, vitals remained stable.  More alert after IV fluid bolus, stable for discharge home.  Discussed with the daughter at  bedside who agrees with discharge home.      ____________________________________________   FINAL CLINICAL IMPRESSION(S) / ED DIAGNOSES    Final diagnoses:  Other fatigue  Dehydration  Chronic dementia without behavioral disturbance ([place])  Type 2 diabetes mellitus without complication, with long-term current use of insulin [facility])     ED Discharge Orders    None      Portions of this note were generated with dragon dictation software. Dictation errors may occur despite best attempts at proofreading.   Carrie Mew, MD 05/01/19 [id number]

## 2019-05-01 NOTE — ED Triage Notes (Signed)
Pt comes via pOV with family from [facility]. Daughter reports pt has some confusion yesterday and has been more today.  Daughter states pt has been refusing to eat. Pt has had diarrhea and pt has not been wanting to get up. Daughter also reports pt didn't know who they were earlier.  Pt states she has been feeling weak. Pt able to state where she is and her daughter's name at this time.  Pt denies any pain. Pt has productive sounding cough. Daughter states she may have some COPD or asthma. Pt has had cough for long time.

## 2019-05-01 NOTE — ED Notes (Signed)
Pure wick was placed on pt 

## 2019-05-03 LAB — GLUCOSE, CAPILLARY: Glucose-Capillary: 94 mg/dL (ref 70–99)

## 2019-08-11 ENCOUNTER — Emergency Department
Admission: EM | Admit: 2019-08-11 | Discharge: 2019-08-11 | Disposition: A | Discharge status: Home / Self Care | Payer: Medicare Other | Attending: Emergency Medicine | Admitting: Emergency Medicine

## 2019-08-11 ENCOUNTER — Other Ambulatory Visit: Payer: Self-pay

## 2019-08-11 ENCOUNTER — Emergency Department: Payer: Medicare Other

## 2019-08-11 ENCOUNTER — Encounter: Payer: Self-pay | Admitting: Emergency Medicine

## 2019-08-11 DIAGNOSIS — R4182 Altered mental status, unspecified: Secondary | ICD-10-CM | POA: Diagnosis present

## 2019-08-11 DIAGNOSIS — Z8673 Personal history of transient ischemic attack (TIA), and cerebral infarction without residual deficits: Secondary | ICD-10-CM | POA: Diagnosis not present

## 2019-08-11 DIAGNOSIS — Z96649 Presence of unspecified artificial hip joint: Secondary | ICD-10-CM | POA: Insufficient documentation

## 2019-08-11 DIAGNOSIS — J45909 Unspecified asthma, uncomplicated: Secondary | ICD-10-CM | POA: Diagnosis not present

## 2019-08-11 DIAGNOSIS — Z79899 Other long term (current) drug therapy: Secondary | ICD-10-CM | POA: Diagnosis not present

## 2019-08-11 DIAGNOSIS — Z7982 Long term (current) use of aspirin: Secondary | ICD-10-CM | POA: Diagnosis not present

## 2019-08-11 DIAGNOSIS — Z87891 Personal history of nicotine dependence: Secondary | ICD-10-CM | POA: Insufficient documentation

## 2019-08-11 DIAGNOSIS — E119 Type 2 diabetes mellitus without complications: Secondary | ICD-10-CM | POA: Diagnosis not present

## 2019-08-11 DIAGNOSIS — J449 Chronic obstructive pulmonary disease, unspecified: Secondary | ICD-10-CM | POA: Insufficient documentation

## 2019-08-11 DIAGNOSIS — F0390 Unspecified dementia without behavioral disturbance: Secondary | ICD-10-CM | POA: Insufficient documentation

## 2019-08-11 DIAGNOSIS — R410 Disorientation, unspecified: Secondary | ICD-10-CM | POA: Insufficient documentation

## 2019-08-11 DIAGNOSIS — I10 Essential (primary) hypertension: Secondary | ICD-10-CM | POA: Insufficient documentation

## 2019-08-11 DIAGNOSIS — Z7984 Long term (current) use of oral hypoglycemic drugs: Secondary | ICD-10-CM | POA: Diagnosis not present

## 2019-08-11 DIAGNOSIS — Z7951 Long term (current) use of inhaled steroids: Secondary | ICD-10-CM | POA: Insufficient documentation

## 2019-08-11 LAB — COMPREHENSIVE METABOLIC PANEL
ALT: 56 U/L — ABNORMAL HIGH (ref 0–44)
AST: 39 U/L (ref 15–41)
Albumin: 4.2 g/dL (ref 3.5–5.0)
Alkaline Phosphatase: 98 U/L (ref 38–126)
Anion gap: 12 (ref 5–15)
BUN: 25 mg/dL — ABNORMAL HIGH (ref 8–23)
CO2: 25 mmol/L (ref 22–32)
Calcium: 9.4 mg/dL (ref 8.9–10.3)
Chloride: 107 mmol/L (ref 98–111)
Creatinine, Ser: 0.9 mg/dL (ref 0.44–1.00)
GFR calc Af Amer: >60 mL/min (ref >60)
GFR calc non Af Amer: 54 mL/min — ABNORMAL LOW (ref >60)
Glucose, Bld: 74 mg/dL (ref 70–99)
Potassium: 4.2 mmol/L (ref 3.5–5.1)
Sodium: 144 mmol/L (ref 135–145)
Total Bilirubin: 0.9 mg/dL (ref 0.3–1.2)
Total Protein: 7.6 g/dL (ref 6.5–8.1)

## 2019-08-11 LAB — CBC
HCT: 38.8 % (ref 36.0–46.0)
Hemoglobin: 12.8 g/dL (ref 12.0–15.0)
MCH: 31.1 pg (ref 26.0–34.0)
MCHC: 33 g/dL (ref 30.0–36.0)
MCV: 94.4 fL (ref 80.0–100.0)
Platelets: 168 10*3/uL (ref 150–400)
RBC: 4.11 MIL/uL (ref 3.87–5.11)
RDW: 13.8 % (ref 11.5–15.5)
WBC: 11.7 10*3/uL — ABNORMAL HIGH (ref 4.0–10.5)
nRBC: 0 % (ref 0.0–0.2)

## 2019-08-11 LAB — URINALYSIS, COMPLETE (UACMP) WITH MICROSCOPIC
Bacteria, UA: NONE SEEN
Bilirubin Urine: NEGATIVE
Glucose, UA: 50 mg/dL — AB
Hgb urine dipstick: NEGATIVE
Ketones, ur: NEGATIVE mg/dL
Leukocytes,Ua: NEGATIVE
Nitrite: NEGATIVE
Protein, ur: 30 mg/dL — AB
Specific Gravity, Urine: 1.012 (ref 1.005–1.030)
pH: 5 (ref 5.0–8.0)

## 2019-08-11 LAB — GLUCOSE, CAPILLARY: Glucose-Capillary: 80 mg/dL (ref 70–99)

## 2019-08-11 MED ORDER — SODIUM CHLORIDE 0.9% FLUSH: 3.0000 mL | Freq: Once | INTRAVENOUS | Status: DC

## 2019-08-11 NOTE — Discharge Instructions (Addendum)
Please seek medical attention for any high fevers, chest pain, shortness of breath, change in behavior, persistent vomiting, bloody stool or any other new or concerning symptoms.  

## 2019-08-11 NOTE — ED Notes (Signed)
cbg 80

## 2019-08-11 NOTE — ED Notes (Signed)
Dr goodman at bedside. 

## 2019-08-11 NOTE — ED Notes (Signed)
See triage note, pt family reports pt has had increased confused and been hearing and seeing things that are not present since last night.  Pt states she has been "hearing music". Pt visually impaired.  Baseline dementia, oriented to person only.  Pt poor historian, reports "I feel fine"

## 2019-08-11 NOTE — ED Notes (Signed)
Signature pad not working. Pt and daughter verbalize consent for d/c, denies questions or concerns at this time

## 2019-08-11 NOTE — ED Triage Notes (Signed)
Pt to ED via [place] with daughter. Pt states that she is not having any symptoms, that the lady at her car home thought that she had an ear infection. Pt daughter states that pt has been hearing things that are not there, care home is concerned that patient may have a UTI. Pt is able to answer certain questions appropriately but does not know what year it is and is slow and answer what month she was born. Pt does not appear to be in any distress at this time.

## 2019-08-11 NOTE — ED Provider Notes (Signed)
[facility] Emergency Department Provider Note  ____________________________________________   I have reviewed the triage vital signs and the nursing notes.   HISTORY  Chief Complaint Altered Mental Status   History limited by and level 5 caveat due to: Altered mental status   HPI Angela Neal is a 86 y.o. female who presents to the emergency department today because of concerns for altered mental status and hallucinations.  History is primarily obtained from daughter who is at bedside.  Patient brought to the emergency department today because of concerns for increased confusion and some hallucinations over the past couple of days.  Daughter states patient has history of dementia and has had some issues in the past but never this severe.  For the past few days they have noticed increased confusion as well as auditory hallucinations.  Patient feels there is music playing.  They did have some concerns that the patient possibly had a urinary tract infection although daughter denies any reported fevers.  No reported falls.   Records reviewed. Per medical record review patient has a history of COPD, DM, HTN, CVA.  Past Medical History:  Diagnosis Date  . Asthma   . COPD (chronic obstructive pulmonary disease) ([place])   . Diabetes mellitus without complication ([place])   . Hypertension   . Macular degeneration   . Stroke [facility])     There are no problems to display for this patient.   Past Surgical History:  Procedure Laterality Date  . ABDOMINAL HYSTERECTOMY    . HIP ARTHROPLASTY      Prior to Admission medications   Medication Sig Start Date End Date Taking? Authorizing Provider  albuterol (VENTOLIN HFA) 108 (90 Base) MCG/ACT inhaler Inhale 1-2 puffs into the lungs every 6 (six) hours as needed for wheezing or shortness of breath.    [provider]  alendronate (FOSAMAX) 70 MG tablet Take 70 mg by mouth every Saturday.  04/03/19   [provider]  amLODipine (NORVASC) 5 MG tablet Take 5 mg by mouth daily. 04/03/19   [provider]  aspirin (ASPIR-LOW) 81 MG EC tablet Take 81 mg by mouth daily.    [provider]  atorvastatin (LIPITOR) 40 MG tablet Take 40 mg by mouth daily in the afternoon. 04/03/19   [provider]  Cholecalciferol 25 MCG (1000 UT) tablet Take 1,000 Units by mouth daily. 01/09/19   [provider]  fluticasone (FLONASE) 50 MCG/ACT nasal spray Place 1-2 sprays into both nostrils daily as needed for allergies or rhinitis.    [provider]  JANUVIA 50 MG tablet Take 50 mg by mouth daily. 04/27/19   [provider]  LEVEMIR FLEXTOUCH 100 UNIT/ML FlexPen Inject 7 Units into the skin at bedtime. 03/21/19   [provider]  losartan (COZAAR) 100 MG tablet Take 100 mg by mouth daily. 04/03/19   [provider]  metFORMIN (GLUCOPHAGE) 1000 MG tablet Take 1,000 mg by mouth daily. 04/27/19   [provider]  montelukast (SINGULAIR) 10 MG tablet Take 10 mg by mouth at bedtime. 04/03/19   [provider]  Multiple Vitamins-Minerals (GNP HEALTHY EYES SUPERVISION 2) CAPS Take 1 capsule by mouth in the morning and at bedtime.    [provider]  NOVOLOG FLEXPEN 100 UNIT/ML FlexPen Inject 4 Units into the skin 3 (three) times daily before meals. (plus up to 5u sliding scale correction) 03/21/19   [provider]  sertraline (ZOLOFT) 50 MG tablet Take 75 mg  by mouth at bedtime. 04/04/19   [provider]    Allergies Sulfa antibiotics  No family history on file.  Social History Social History   Tobacco Use  . Smoking status: Former Research scientist (life sciences)  . Smokeless tobacco: Never Used  Substance Use Topics  . Alcohol use: Never  . Drug use: Never    Review of Systems Unreliable ROS secondary to AMS/dementia. ____________________________________________   PHYSICAL EXAM:  VITAL SIGNS: ED Triage Vitals  Enc  Vitals Group     BP 08/11/19 1148 132/77     Pulse Rate 08/11/19 1148 97     Resp 08/11/19 1148 16     Temp 08/11/19 1148 98.4 F (36.9 C)     Temp Source 08/11/19 1148 Oral     SpO2 08/11/19 1148 97 %     Weight 08/11/19 1149 87 lb (39.5 kg)     Height --      Head Circumference --      Peak Flow --      Pain Score 08/11/19 1148 0    Constitutional: Awake and alert. Not completely oriented. Eyes: Conjunctivae are normal.  ENT      Head: Normocephalic and atraumatic.      Nose: No congestion/rhinnorhea.      Mouth/Throat: Mucous membranes are moist.      Neck: No stridor. Hematological/Lymphatic/Immunilogical: No cervical lymphadenopathy. Cardiovascular: Normal rate, regular rhythm.  No murmurs, rubs, or gallops. Respiratory: Normal respiratory effort without tachypnea nor retractions. Breath sounds are clear and equal bilaterally. No wheezes/rales/rhonchi. Gastrointestinal: Soft and non tender. No rebound. No guarding.  Genitourinary: Deferred Musculoskeletal: Normal range of motion in all extremities. No lower extremity edema. Neurologic:  Not completely oriented. Does endorse hearing music. No gross focal neurologic deficits are appreciated.  Skin:  Skin is warm, dry and intact. No rash noted. Psychiatric: Mood and affect are normal. Speech and behavior are normal. Patient exhibits appropriate insight and judgment.  ____________________________________________    LABS (pertinent positives/negatives)  CBC wbc 11.7, hgb 12.8, plt 168 CMP wnl except bun 25, alt 56 UA clear, glucose 50, rbc and wbc 0-5  ____________________________________________   EKG  None  ____________________________________________    RADIOLOGY  CT head No acute abnormality  ____________________________________________   PROCEDURES  Procedures  ____________________________________________   INITIAL IMPRESSION / ASSESSMENT AND PLAN / ED COURSE  Pertinent labs & imaging results  that were available during my care of the patient were reviewed by me and considered in my medical decision making (see chart for details).   Patient presented to the emergency department today accompanied by family because of concerns for confusion and possible hallucinations. On exam patient is not completely oriented. It does appear to have some confusion. She does not appear to be responding to internal stimuli. Family did have concern for possible UTI however urine today without obvious findings consistent with infection. Blood work without concerning electrolyte abnormalities or abnormal blood counts. Head CT without any acute findings. Discussed findings with patient and family. At this point will plan on discharging. Discussed portance of follow-up.   ____________________________________________   FINAL CLINICAL IMPRESSION(S) / ED DIAGNOSES  Final diagnoses:  Confusion     Note: This dictation was prepared with Dragon dictation. Any transcriptional errors that result from this process are unintentional     Nance Pear, MD 08/11/19 [phone]

## 2019-08-11 NOTE — ED Notes (Signed)
Repeat VS obtained by this RN. Pt visualized in NAD, sitting on bench with daughter.

## 2019-08-11 NOTE — ED Notes (Signed)
Repeat VS obtained by this RN. Pt visualized in NAD at this time.  

## 2019-08-13 LAB — URINE CULTURE: Culture: <10000 — AB

## 2020-06-13 DIAGNOSIS — E1122 Type 2 diabetes mellitus with diabetic chronic kidney disease: Secondary | ICD-10-CM | POA: Diagnosis not present

## 2020-07-02 ENCOUNTER — Encounter: Payer: Self-pay | Admitting: Family Medicine

## 2020-07-02 ENCOUNTER — Other Ambulatory Visit: Payer: Self-pay

## 2020-07-02 ENCOUNTER — Ambulatory Visit (INDEPENDENT_AMBULATORY_CARE_PROVIDER_SITE_OTHER): Payer: HMO | Admitting: Family Medicine

## 2020-07-02 VITALS — BP 143/78 | HR 77 | Temp 97.5°F | Wt 93.2 lb

## 2020-07-02 DIAGNOSIS — E1122 Type 2 diabetes mellitus with diabetic chronic kidney disease: Secondary | ICD-10-CM

## 2020-07-02 DIAGNOSIS — I129 Hypertensive chronic kidney disease with stage 1 through stage 4 chronic kidney disease, or unspecified chronic kidney disease: Secondary | ICD-10-CM

## 2020-07-02 DIAGNOSIS — K5790 Diverticulosis of intestine, part unspecified, without perforation or abscess without bleeding: Secondary | ICD-10-CM

## 2020-07-02 DIAGNOSIS — H3530 Unspecified macular degeneration: Secondary | ICD-10-CM

## 2020-07-02 DIAGNOSIS — D3500 Benign neoplasm of unspecified adrenal gland: Secondary | ICD-10-CM

## 2020-07-02 DIAGNOSIS — E1169 Type 2 diabetes mellitus with other specified complication: Secondary | ICD-10-CM | POA: Diagnosis not present

## 2020-07-02 DIAGNOSIS — Z794 Long term (current) use of insulin: Secondary | ICD-10-CM

## 2020-07-02 DIAGNOSIS — E042 Nontoxic multinodular goiter: Secondary | ICD-10-CM

## 2020-07-02 DIAGNOSIS — F339 Major depressive disorder, recurrent, unspecified: Secondary | ICD-10-CM

## 2020-07-02 DIAGNOSIS — R0989 Other specified symptoms and signs involving the circulatory and respiratory systems: Secondary | ICD-10-CM

## 2020-07-02 DIAGNOSIS — E785 Hyperlipidemia, unspecified: Secondary | ICD-10-CM

## 2020-07-02 DIAGNOSIS — N183 Chronic kidney disease, stage 3 unspecified: Secondary | ICD-10-CM | POA: Diagnosis not present

## 2020-07-02 DIAGNOSIS — J449 Chronic obstructive pulmonary disease, unspecified: Secondary | ICD-10-CM

## 2020-07-02 DIAGNOSIS — I350 Nonrheumatic aortic (valve) stenosis: Secondary | ICD-10-CM

## 2020-07-02 MED ORDER — PRESERVISION AREDS 2 PO CAPS: 1.0000 | ORAL_CAPSULE | Freq: Two times a day (BID) | ORAL | 3 refills | Status: DC

## 2020-07-02 NOTE — Progress Notes (Signed)
BP (!) 143/78   Pulse 77   Temp (!) 97.5 F (36.4 C)   Wt 93 lb 3.2 oz (42.3 kg)   SpO2 96%   BMI 18.20 kg/m    Subjective:    Patient ID: Angela Neal, female    DOB: 08/16/1921, 87 y.o.   MRN: [id number]  HPI: Angela Neal is a 87 y.o. female who presents today to establish care.   Chief Complaint  Patient presents with   Establish Care   Hearing Problem    Patient caregiver has concerns that patient has impacted cerumen    Diabetes   COPD   Hypertension   Lives in a care home with 5 other people.   HYPERTENSION Hypertension status: stable  Satisfied with current treatment? yes Duration of hypertension: chronic BP monitoring frequency:  not checking BP range:  BP medication side effects:  no Medication compliance: excellent compliance Previous BP meds:amlodipine, losartan Aspirin: yes Recurrent headaches: no Visual changes: no Palpitations: no Dyspnea: no Chest pain: no Lower extremity edema: no Dizzy/lightheaded: no  DIABETES- following with Dr. Gabriel Carina, last saw her in May Hypoglycemic episodes:no Polydipsia/polyuria: no Visual disturbance: no Chest pain: no Paresthesias: no Glucose Monitoring: no  Accucheck frequency: Not Checking Taking Insulin?: yes Blood Pressure Monitoring: not checking Retinal Examination: Up to Date Foot Exam: Up to Date Diabetic Education: Completed Pneumovax: Up to Date Influenza: Up to Date Aspirin: yes  DEPRESSION Mood status: stable Satisfied with current treatment?: yes Symptom severity: mild  Duration of current treatment : chronic Side effects: no Medication compliance: excellent compliance Psychotherapy/counseling: no  Previous psychiatric medications: zoloft Depressed mood: no Anxious mood: no Anhedonia: no Significant weight loss or gain: no Insomnia: no  Fatigue: no Feelings of worthlessness or guilt: no Impaired concentration/indecisiveness: no Suicidal ideations: no Hopelessness:  no Crying spells: no Depression screen [facility] 2/9 07/02/2020  Decreased Interest 0  Down, Depressed, Hopeless 0  PHQ - 2 Score 0     Active Ambulatory Problems    Diagnosis Date Noted   COPD (chronic obstructive pulmonary disease) ([place]) 07/07/2020   Type 2 diabetes mellitus with renal complication ([place]) 86/76/1948   Macular degeneration 07/07/2020   Benign hypertensive renal disease 07/07/2020   Hyperlipidemia associated with type 2 diabetes mellitus ([place]) 07/07/2020   Depression, recurrent ([place]) 07/07/2020   Stage 3 chronic kidney disease ([place]) 07/07/2020   Carotid bruit 07/07/2020   Nonrheumatic aortic valve stenosis 07/07/2020   Multinodular goiter 07/07/2020   Adrenal adenoma 07/07/2020   Diverticulosis 07/07/2020   Resolved Ambulatory Problems    Diagnosis Date Noted   Hypertension 07/07/2020   Cardiac murmur 07/07/2020   Past Medical History:  Diagnosis Date   Asthma    Diabetes mellitus without complication ([place])    Stroke [facility])    Past Surgical History:  Procedure Laterality Date   ABDOMINAL HYSTERECTOMY     HIP ARTHROPLASTY     Outpatient Encounter Medications as of 07/02/2020  Medication Sig   albuterol (VENTOLIN HFA) 108 (90 Base) MCG/ACT inhaler Inhale 1-2 puffs into the lungs every 6 (six) hours as needed for wheezing or shortness of breath.   alendronate (FOSAMAX) 70 MG tablet Take 70 mg by mouth every Saturday.    amLODipine (NORVASC) 5 MG tablet Take 5 mg by mouth daily.   aspirin 81 MG EC tablet Take 81 mg by mouth daily.   atorvastatin (LIPITOR) 40 MG tablet Take 40 mg by mouth daily in the afternoon.   Cholecalciferol 25 MCG (  1000 UT) tablet Take 1,000 Units by mouth daily.   fluticasone (FLONASE) 50 MCG/ACT nasal spray Place 1-2 sprays into both nostrils daily as needed for allergies or rhinitis.   JANUVIA 50 MG tablet Take 50 mg by mouth daily.   LEVEMIR FLEXTOUCH 100 UNIT/ML FlexPen Inject 7 Units into the skin at bedtime.   losartan (COZAAR) 100 MG  tablet Take 100 mg by mouth daily.   montelukast (SINGULAIR) 10 MG tablet Take 10 mg by mouth at bedtime.   Multiple Vitamins-Minerals (PRESERVISION AREDS 2) CAPS Take 1 capsule by mouth 2 (two) times daily.   NOVOLOG FLEXPEN 100 UNIT/ML FlexPen Inject 4 Units into the skin 3 (three) times daily before meals. (plus up to 5u sliding scale correction)   Psyllium (METAMUCIL) 0.36 g CAPS Take 1 capsule by mouth daily.   sertraline (ZOLOFT) 50 MG tablet Take 75 mg by mouth at bedtime.   [DISCONTINUED] metFORMIN (GLUCOPHAGE) 1000 MG tablet Take 1,000 mg by mouth daily. (Patient not taking: Reported on 07/02/2020)   [DISCONTINUED] Multiple Vitamins-Minerals (GNP HEALTHY EYES SUPERVISION 2) CAPS Take 1 capsule by mouth in the morning and at bedtime. (Patient not taking: Reported on 07/02/2020)   No facility-administered encounter medications on file as of 07/02/2020.   Allergies  Allergen Reactions   Sulfa Antibiotics Anaphylaxis   Family History  Problem Relation Age of Onset   Arthritis Mother    Heart disease Mother    Social History   Socioeconomic History   Marital status: Widowed    Spouse name: Not on file   Number of children: Not on file   Years of education: Not on file   Highest education level: Not on file  Occupational History   Not on file  Tobacco Use   Smoking status: Former    Pack years: 0.00   Smokeless tobacco: Never  Vaping Use   Vaping Use: Never used  Substance and Sexual Activity   Alcohol use: Never   Drug use: Never   Sexual activity: Not Currently  Other Topics Concern   Not on file  Social History Narrative   Not on file   Social Determinants of Health   Financial Resource Strain: Not on file  Food Insecurity: Not on file  Transportation Needs: Not on file  Physical Activity: Not on file  Stress: Not on file  Social Connections: Not on file    Review of Systems  Constitutional: Negative.   Respiratory: Negative.    Cardiovascular: Negative.    Gastrointestinal: Negative.   Musculoskeletal: Negative.   Neurological: Negative.   Psychiatric/Behavioral: Negative.     Per HPI unless specifically indicated above     Objective:    BP (!) 143/78   Pulse 77   Temp (!) 97.5 F (36.4 C)   Wt 93 lb 3.2 oz (42.3 kg)   SpO2 96%   BMI 18.20 kg/m   Wt Readings from Last 3 Encounters:  07/02/20 93 lb 3.2 oz (42.3 kg)  08/11/19 87 lb (39.5 kg)  05/01/19 87 lb (39.5 kg)    Physical Exam Vitals and nursing note reviewed.  Constitutional:      General: She is not in acute distress.    Appearance: Normal appearance. She is not ill-appearing, toxic-appearing or diaphoretic.  HENT:     Head: Normocephalic and atraumatic.     Right Ear: External ear normal.     Left Ear: External ear normal.     Nose: Nose normal.     Mouth/Throat:  Mouth: Mucous membranes are moist.     Pharynx: Oropharynx is clear.  Eyes:     General: No scleral icterus.       Right eye: No discharge.        Left eye: No discharge.     Extraocular Movements: Extraocular movements intact.     Conjunctiva/sclera: Conjunctivae normal.     Pupils: Pupils are equal, round, and reactive to light.  Cardiovascular:     Rate and Rhythm: Normal rate and regular rhythm.     Pulses: Normal pulses.     Heart sounds: Murmur heard.    No friction rub. No gallop.  Pulmonary:     Effort: Pulmonary effort is normal. No respiratory distress.     Breath sounds: Normal breath sounds. No stridor. No wheezing, rhonchi or rales.  Chest:     Chest wall: No tenderness.  Musculoskeletal:        General: Normal range of motion.     Cervical back: Normal range of motion and neck supple.  Skin:    General: Skin is warm and dry.     Capillary Refill: Capillary refill takes less than 2 seconds.     Coloration: Skin is not jaundiced or pale.     Findings: No bruising, erythema, lesion or rash.  Neurological:     General: No focal deficit present.     Mental Status: She is  alert and oriented to person, place, and time. Mental status is at baseline.  Psychiatric:        Mood and Affect: Mood normal.        Behavior: Behavior normal.        Thought Content: Thought content normal.        Judgment: Judgment normal.    Results for orders placed or performed during the hospital encounter of 08/11/19  Urine Culture   Specimen: Urine, Random  Result Value Ref Range   Specimen Description      URINE, RANDOM Performed at [facility], [address] [place], [place] [address]    Special Requests      NONE Performed at [facility], [address] [place], [place] [address]    Culture (A)     <10,000 COLONIES/mL INSIGNIFICANT GROWTH Performed at [facility] [address], [place], [place] [address]    Report Status 08/13/2019 FINAL   Comprehensive metabolic panel  Result Value Ref Range   Sodium 144 135 - 145 mmol/L   Potassium 4.2 3.5 - 5.1 mmol/L   Chloride 107 98 - 111 mmol/L   CO2 25 22 - 32 mmol/L   Glucose, Bld 74 70 - 99 mg/dL   BUN 25 (H) 8 - 23 mg/dL   Creatinine, Ser 0.90 0.44 - 1.00 mg/dL   Calcium 9.4 8.9 - 10.3 mg/dL   Total Protein 7.6 6.5 - 8.1 g/dL   Albumin 4.2 3.5 - 5.0 g/dL   AST 39 15 - 41 U/L   ALT 56 (H) 0 - 44 U/L   Alkaline Phosphatase 98 38 - 126 U/L   Total Bilirubin 0.9 0.3 - 1.2 mg/dL   GFR calc non Af Amer 54 (L) >60 mL/min   GFR calc Af Amer >60 >60 mL/min   Anion gap 12 5 - 15  CBC  Result Value Ref Range   WBC 11.7 (H) 4.0 - 10.5 K/uL   RBC 4.11 3.87 - 5.11 MIL/uL   Hemoglobin 12.8 12.0 - 15.0 g/dL  HCT 38.8 36.0 - 46.0 %   MCV 94.4 80.0 - 100.0 fL   MCH 31.1 26.0 - 34.0 pg   MCHC 33.0 30.0 - 36.0 g/dL   RDW 13.8 11.5 - 15.5 %   Platelets 168 150 - 400 K/uL   nRBC 0.0 0.0 - 0.2 %  Urinalysis, Complete w Microscopic  Result Value Ref Range   Color, Urine YELLOW (A) YELLOW   APPearance CLEAR (A) CLEAR   Specific Gravity, Urine 1.012 1.005 - 1.030   pH 5.0 5.0 - 8.0    Glucose, UA 50 (A) NEGATIVE mg/dL   Hgb urine dipstick NEGATIVE NEGATIVE   Bilirubin Urine NEGATIVE NEGATIVE   Ketones, ur NEGATIVE NEGATIVE mg/dL   Protein, ur 30 (A) NEGATIVE mg/dL   Nitrite NEGATIVE NEGATIVE   Leukocytes,Ua NEGATIVE NEGATIVE   RBC / HPF 0-5 0 - 5 RBC/hpf   WBC, UA 0-5 0 - 5 WBC/hpf   Bacteria, UA NONE SEEN NONE SEEN   Squamous Epithelial / LPF 0-5 0 - 5  Glucose, capillary  Result Value Ref Range   Glucose-Capillary 80 70 - 99 mg/dL      Assessment & Plan:   Problem List Items Addressed This Visit       Cardiovascular and Mediastinum   Nonrheumatic aortic valve stenosis    Continue to follow with cardiology. Call with any concerns. Continue to monitor.          Respiratory   COPD (chronic obstructive pulmonary disease) (HCC)    Has not needed her inhalers. Continue to monitor. Call with any concerns.          Digestive   Diverticulosis    Continue to monitor diet. Call with any concerns.          Endocrine   Type 2 diabetes mellitus with renal complication (H[facility])    Follows with endocrinology. Stable. Labs done recently. Continue to monitor.        Hyperlipidemia associated with type 2 diabetes mellitus ([place])    Under good control on current regimen. Continue current regimen. Continue to monitor. Call with any concerns. Refills up to date. Labs done by endocrinology in April.         Multinodular goiter    Follows with endocrinology. Stable. Labs done recently. Continue to monitor.        Adrenal adenoma    Under good control on current regimen. Continue current regimen. Continue to monitor. Call with any concerns. Refills up to date. Labs done by endocrinology in April.          Genitourinary   Benign hypertensive renal disease - Primary    Under good control on current regimen. Continue current regimen. Continue to monitor. Call with any concerns. Refills up to date. Labs done by endocrinology in April.        Stage 3 chronic  kidney disease ([place])    Under good control on current regimen. Continue current regimen. Continue to monitor. Call with any concerns. Refills up to date. Labs done by endocrinology in April.          Other   Macular degeneration    Continue to follow with ophthalmology. Call with any concerns.        Depression, recurrent ([place])    Under good control on current regimen. Continue current regimen. Continue to monitor. Call with any concerns. Refills up to date.       Carotid bruit    Continue to follow with cardiology. Call  with any concerns. Continue to monitor.          Follow up plan: Return in about 3 months (around 10/02/2020).

## 2020-07-04 ENCOUNTER — Telehealth: Payer: Self-pay | Admitting: *Deleted

## 2020-07-04 NOTE — Telephone Encounter (Signed)
Pts daughter brought in a form w/ a prepaid envelope for nursing home to be filled out by Dr.Johnson for her visit on 07/02/2020  Gave to Sheridan

## 2020-07-07 ENCOUNTER — Encounter: Payer: Self-pay | Admitting: Family Medicine

## 2020-07-07 DIAGNOSIS — R011 Cardiac murmur, unspecified: Secondary | ICD-10-CM | POA: Insufficient documentation

## 2020-07-07 DIAGNOSIS — E042 Nontoxic multinodular goiter: Secondary | ICD-10-CM | POA: Insufficient documentation

## 2020-07-07 DIAGNOSIS — I129 Hypertensive chronic kidney disease with stage 1 through stage 4 chronic kidney disease, or unspecified chronic kidney disease: Secondary | ICD-10-CM | POA: Insufficient documentation

## 2020-07-07 DIAGNOSIS — I10 Essential (primary) hypertension: Secondary | ICD-10-CM | POA: Insufficient documentation

## 2020-07-07 DIAGNOSIS — D3500 Benign neoplasm of unspecified adrenal gland: Secondary | ICD-10-CM | POA: Insufficient documentation

## 2020-07-07 DIAGNOSIS — H3530 Unspecified macular degeneration: Secondary | ICD-10-CM | POA: Insufficient documentation

## 2020-07-07 DIAGNOSIS — K5790 Diverticulosis of intestine, part unspecified, without perforation or abscess without bleeding: Secondary | ICD-10-CM | POA: Insufficient documentation

## 2020-07-07 DIAGNOSIS — E1169 Type 2 diabetes mellitus with other specified complication: Secondary | ICD-10-CM | POA: Insufficient documentation

## 2020-07-07 DIAGNOSIS — F339 Major depressive disorder, recurrent, unspecified: Secondary | ICD-10-CM | POA: Insufficient documentation

## 2020-07-07 DIAGNOSIS — R0989 Other specified symptoms and signs involving the circulatory and respiratory systems: Secondary | ICD-10-CM | POA: Insufficient documentation

## 2020-07-07 DIAGNOSIS — N183 Chronic kidney disease, stage 3 unspecified: Secondary | ICD-10-CM | POA: Insufficient documentation

## 2020-07-07 DIAGNOSIS — J449 Chronic obstructive pulmonary disease, unspecified: Secondary | ICD-10-CM | POA: Insufficient documentation

## 2020-07-07 DIAGNOSIS — E1129 Type 2 diabetes mellitus with other diabetic kidney complication: Secondary | ICD-10-CM | POA: Insufficient documentation

## 2020-07-07 DIAGNOSIS — I350 Nonrheumatic aortic (valve) stenosis: Secondary | ICD-10-CM | POA: Insufficient documentation

## 2020-07-07 NOTE — Assessment & Plan Note (Signed)
Continue to follow with cardiology. Call with any concerns. Continue to monitor.  

## 2020-07-07 NOTE — Assessment & Plan Note (Signed)
Continue to follow with ophthalmology. Call with any concerns.  

## 2020-07-07 NOTE — Assessment & Plan Note (Signed)
Under good control on current regimen. Continue current regimen. Continue to monitor. Call with any concerns. Refills up to date. Labs done by endocrinology in April.

## 2020-07-07 NOTE — Assessment & Plan Note (Signed)
Follows with endocrinology. Stable. Labs done recently. Continue to monitor.

## 2020-07-07 NOTE — Assessment & Plan Note (Signed)
Has not needed her inhalers. Continue to monitor. Call with any concerns.

## 2020-07-07 NOTE — Assessment & Plan Note (Signed)
Continue to monitor diet. Call with any concerns.

## 2020-07-07 NOTE — Assessment & Plan Note (Signed)
Under good control on current regimen. Continue current regimen. Continue to monitor. Call with any concerns. Refills up to date.   

## 2020-07-08 NOTE — Telephone Encounter (Signed)
Form filled out

## 2020-07-10 DIAGNOSIS — E1122 Type 2 diabetes mellitus with diabetic chronic kidney disease: Secondary | ICD-10-CM | POA: Diagnosis not present

## 2020-07-31 ENCOUNTER — Other Ambulatory Visit: Payer: Self-pay | Admitting: Family Medicine

## 2020-07-31 NOTE — Telephone Encounter (Signed)
Patient last seen 07/02/20, has appointment 10/02/20

## 2020-07-31 NOTE — Telephone Encounter (Signed)
  Notes to clinic:  medication filled by a historical provider  Review for continued use    Requested Prescriptions  Pending Prescriptions Disp Refills   sertraline (ZOLOFT) 50 MG tablet [Pharmacy Med Name: SERTRALINE HCL 50 MG TAB] 135 tablet     Sig: TAKE 1.5 TABLETS (75 MG TOTAL) BY MOUTH ONCE DAILY      Psychiatry:  Antidepressants - SSRI Passed - 07/31/2020 10:56 AM      Passed - Completed PHQ-2 or PHQ-9 in the last 360 days      Passed - Valid encounter within last 6 months    Recent Outpatient Visits           4 weeks ago Benign hypertensive renal disease   [facility] Valerie Roys, DO       Future Appointments             In 2 months Wynetta Emery, Barb Merino, DO [facility], [facility]

## 2020-07-31 NOTE — Telephone Encounter (Signed)
  Notes to clinic:  medication filled on by historical provider  Review for refill    Requested Prescriptions  Pending Prescriptions Disp Refills   amLODipine (NORVASC) 5 MG tablet [Pharmacy Med Name: AMLODIPINE BESYLATE 5 MG TAB] 90 tablet     Sig: TAKE 1 TABLET BY MOUTH ONCE EVERY DAY      Cardiovascular:  Calcium Channel Blockers Failed - 07/31/2020 10:52 AM      Failed - Last BP in normal range    BP Readings from Last 1 Encounters:  07/02/20 (!) 143/78          Passed - Valid encounter within last 6 months    Recent Outpatient Visits           4 weeks ago Benign hypertensive renal disease   [employer], [place], DO       Future Appointments             In 2 months Johnson, Megan P, DO [facility], [facility]               montelukast (SINGULAIR) 10 MG tablet [Pharmacy Med Name: MONTELUKAST SODIUM 10 MG TAB] 90 tablet     Sig: TAKE 1 TABLET(10 MG) BY MOUTH NIGHTLY      Pulmonology:  Leukotriene Inhibitors Passed - 07/31/2020 10:52 AM      Passed - Valid encounter within last 12 months    Recent Outpatient Visits           4 weeks ago Benign hypertensive renal disease   [facility] Valerie Roys, DO       Future Appointments             In 2 months Wynetta Emery, Barb Merino, DO [employer], [facility]

## 2020-08-04 DIAGNOSIS — E113313 Type 2 diabetes mellitus with moderate nonproliferative diabetic retinopathy with macular edema, bilateral: Secondary | ICD-10-CM | POA: Diagnosis not present

## 2020-08-04 DIAGNOSIS — Z794 Long term (current) use of insulin: Secondary | ICD-10-CM | POA: Diagnosis not present

## 2020-08-04 LAB — MICROALBUMIN, URINE: Microalb, Ur: 36

## 2020-08-04 LAB — BASIC METABOLIC PANEL
BUN: 24 — AB (ref 4–21)
CO2: 28 — AB (ref 13–22)
Chloride: 106 (ref 99–108)
Creatinine: 1.1 (ref 0.5–1.1)
Glucose: 279
Potassium: 4.1 (ref 3.4–5.3)
Sodium: 142 (ref 137–147)

## 2020-08-04 LAB — COMPREHENSIVE METABOLIC PANEL: Calcium: 9.2 (ref 8.7–10.7)

## 2020-08-04 LAB — HEMOGLOBIN A1C: Hemoglobin A1C: 7.1

## 2020-08-10 DIAGNOSIS — E1122 Type 2 diabetes mellitus with diabetic chronic kidney disease: Secondary | ICD-10-CM | POA: Diagnosis not present

## 2020-08-11 DIAGNOSIS — Z794 Long term (current) use of insulin: Secondary | ICD-10-CM | POA: Diagnosis not present

## 2020-08-11 DIAGNOSIS — E1129 Type 2 diabetes mellitus with other diabetic kidney complication: Secondary | ICD-10-CM | POA: Diagnosis not present

## 2020-08-11 DIAGNOSIS — M810 Age-related osteoporosis without current pathological fracture: Secondary | ICD-10-CM | POA: Diagnosis not present

## 2020-08-11 DIAGNOSIS — R809 Proteinuria, unspecified: Secondary | ICD-10-CM | POA: Diagnosis not present

## 2020-08-11 DIAGNOSIS — E113313 Type 2 diabetes mellitus with moderate nonproliferative diabetic retinopathy with macular edema, bilateral: Secondary | ICD-10-CM | POA: Diagnosis not present

## 2020-08-11 DIAGNOSIS — E1122 Type 2 diabetes mellitus with diabetic chronic kidney disease: Secondary | ICD-10-CM | POA: Diagnosis not present

## 2020-08-11 DIAGNOSIS — N1831 Chronic kidney disease, stage 3a: Secondary | ICD-10-CM | POA: Diagnosis not present

## 2020-08-31 IMAGING — CT CT HEAD W/O CM
3 series · 16 of 47 positions shown, 19 images · non-contrast
Comparison: April 26, 2014

CLINICAL DATA: Altered mental status.

EXAM:
CT HEAD WITHOUT CONTRAST
TECHNIQUE: Contiguous axial images were obtained from the base of the skull
through the vertex without intravenous contrast.

[Series 2: head wo · axial · 0.41mm/px · z∈[+289,+414]mm · 10 of 31 slices shown, 13 images]
[im 3/31  brain]
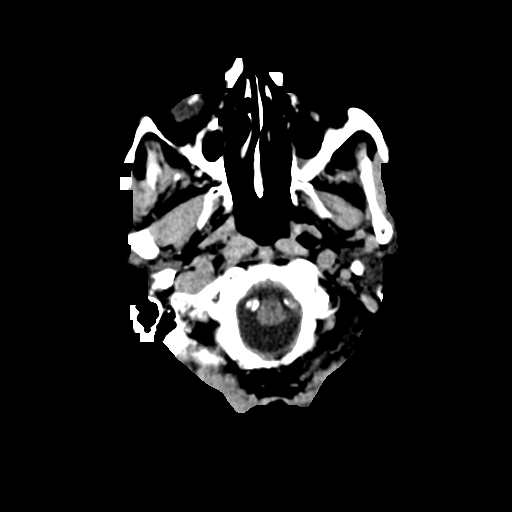
[im 3/31  bone]
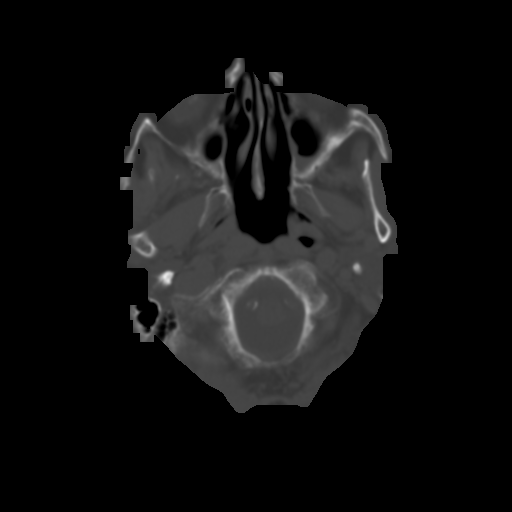
[im 6/31  brain]
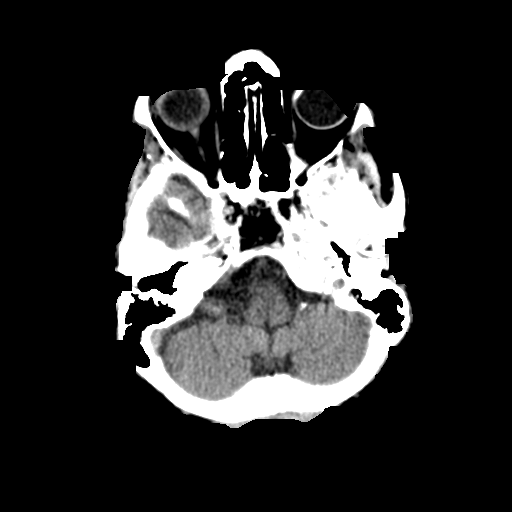
[im 9/31  brain]
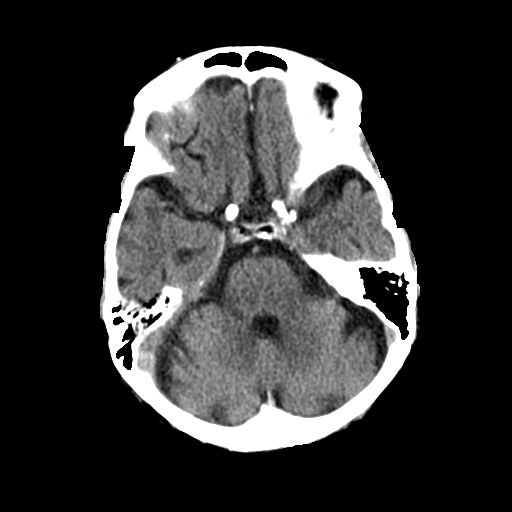
[im 11/31  brain]
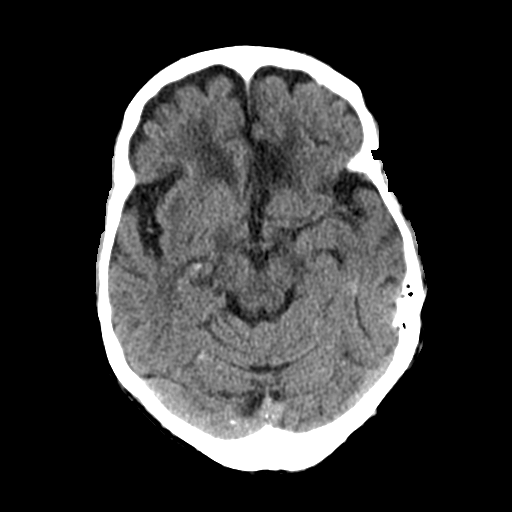
[im 14/31  brain]
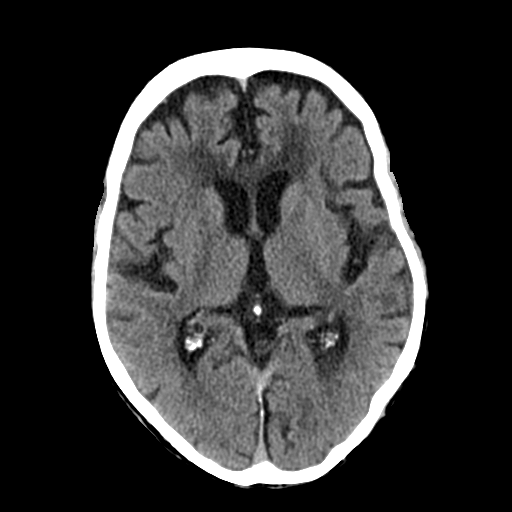
[im 14/31  bone]
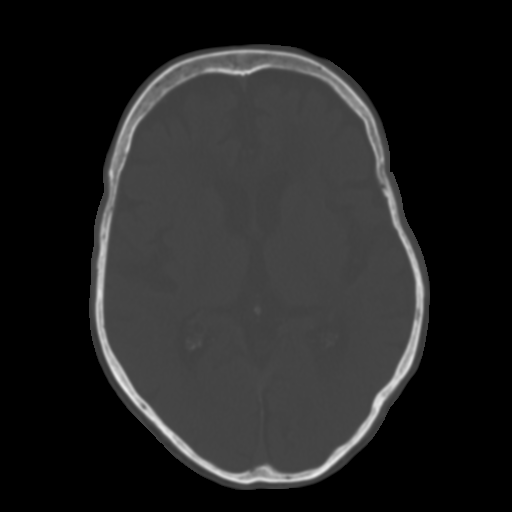
[im 17/31  brain]
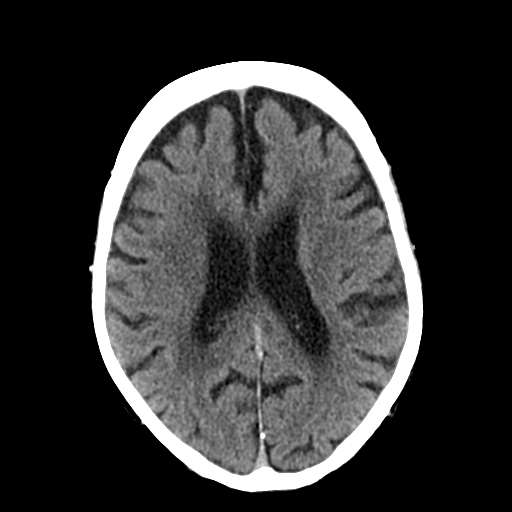
[im 20/31  brain]
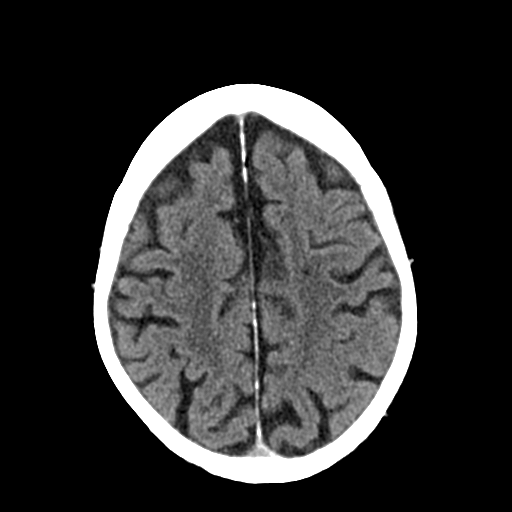
[im 23/31  brain]
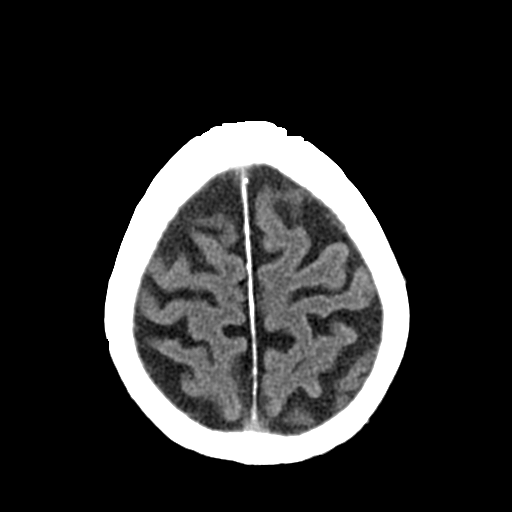
[im 25/31  brain]
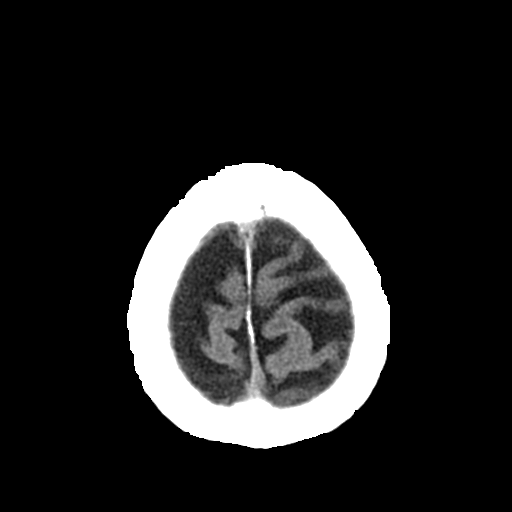
[im 25/31  bone]
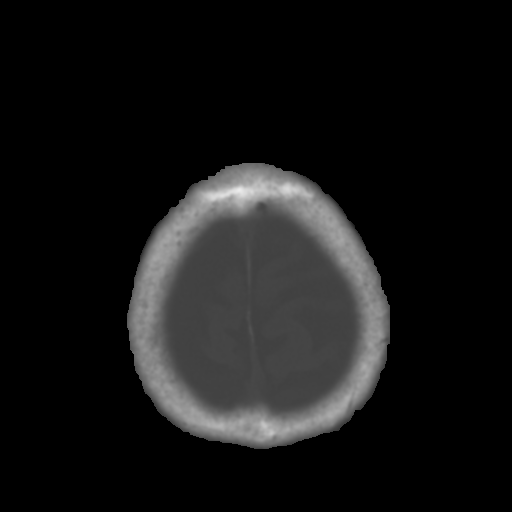
[im 28/31  brain]
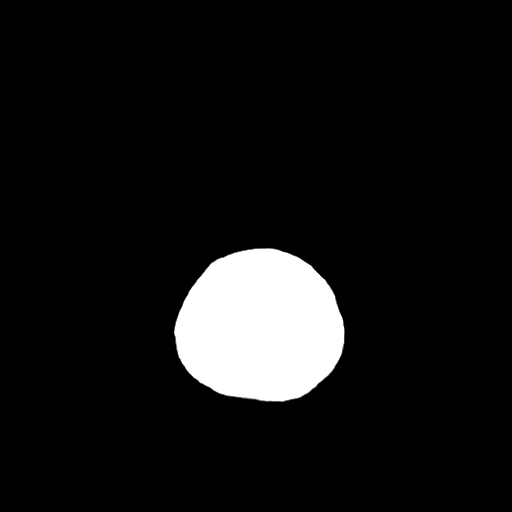

[Series 4: coronal soft tissue · coronal · 0.30mm/px · 3 of 67 slices shown]
[im 23/67  brain]
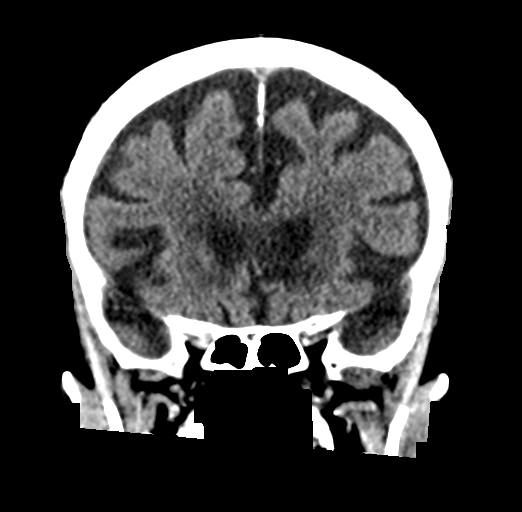
[im 30/67  brain]
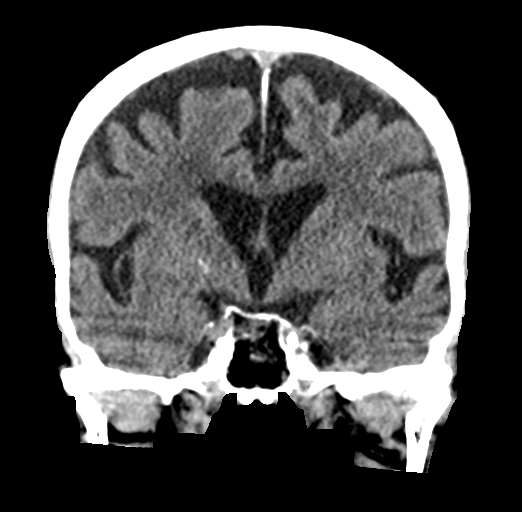
[im 37/67  brain]
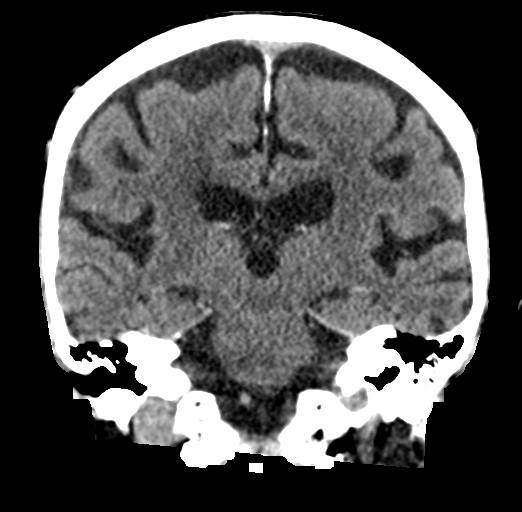

[Series 5: sagittal soft tissue · sagittal · 0.30mm/px · 3 of 53 slices shown]
[im 18/53  brain]
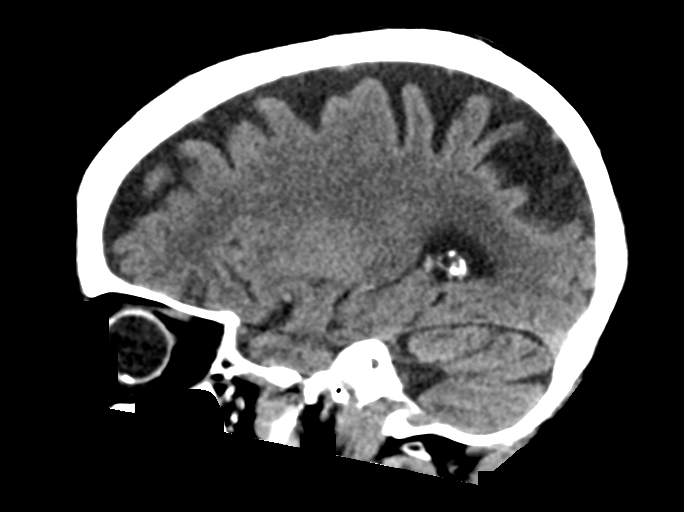
[im 27/53  brain]
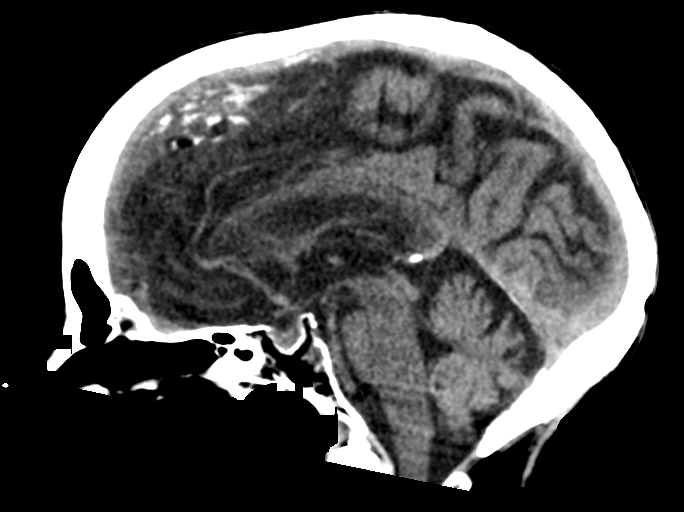
[im 35/53  brain]
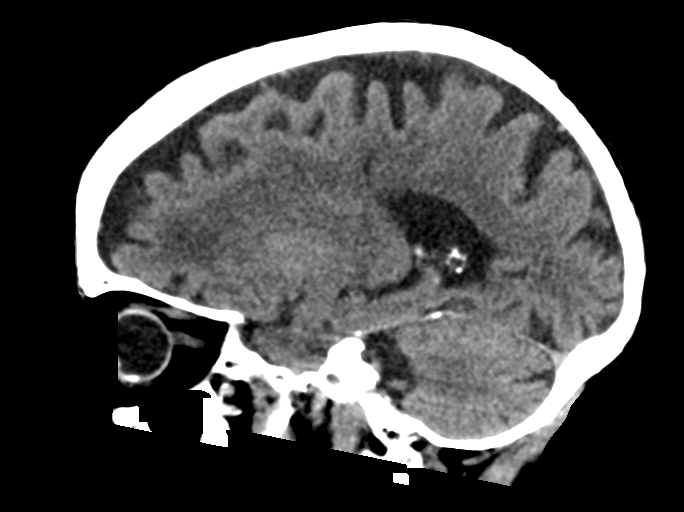

[16 of 47 positions shown; findings below may reference images not displayed]

FINDINGS: Brain: There is mild cerebral atrophy with widening of the
extra-axial spaces and ventricular dilatation.
There are areas of decreased attenuation within the white matter
tracts of the supratentorial brain, consistent with microvascular
disease changes.

Vascular: No hyperdense vessel or unexpected calcification.

Skull: Normal. Negative for fracture or focal lesion.

Sinuses/Orbits: There is mild left maxillary sinus mucosal
thickening.

Other: None.
IMPRESSION: 1. Generalized cerebral atrophy.
2. No acute intracranial abnormality.

## 2020-09-10 DIAGNOSIS — E1122 Type 2 diabetes mellitus with diabetic chronic kidney disease: Secondary | ICD-10-CM | POA: Diagnosis not present

## 2020-09-29 ENCOUNTER — Other Ambulatory Visit: Payer: Self-pay | Admitting: Family Medicine

## 2020-09-30 NOTE — Telephone Encounter (Signed)
Requested medications are due for refill today requesting early  Requested medications are on the active medication list yes  Last refill 8/26 for 3 months  Last visit 6/29  Future visit scheduled 9/29  Notes to clinic requesting early, each rx was for 90 days, requesting after 30 days, please assess.

## 2020-10-02 ENCOUNTER — Ambulatory Visit (INDEPENDENT_AMBULATORY_CARE_PROVIDER_SITE_OTHER): Payer: HMO | Admitting: Family Medicine

## 2020-10-02 ENCOUNTER — Encounter: Payer: Self-pay | Admitting: Family Medicine

## 2020-10-02 ENCOUNTER — Telehealth: Payer: Self-pay | Admitting: Family Medicine

## 2020-10-02 ENCOUNTER — Other Ambulatory Visit: Payer: Self-pay

## 2020-10-02 VITALS — BP 132/72 | HR 68 | Ht 60.0 in | Wt 96.0 lb

## 2020-10-02 DIAGNOSIS — Z794 Long term (current) use of insulin: Secondary | ICD-10-CM | POA: Diagnosis not present

## 2020-10-02 DIAGNOSIS — Z13820 Encounter for screening for osteoporosis: Secondary | ICD-10-CM | POA: Diagnosis not present

## 2020-10-02 DIAGNOSIS — F339 Major depressive disorder, recurrent, unspecified: Secondary | ICD-10-CM | POA: Diagnosis not present

## 2020-10-02 DIAGNOSIS — N183 Chronic kidney disease, stage 3 unspecified: Secondary | ICD-10-CM | POA: Diagnosis not present

## 2020-10-02 DIAGNOSIS — I129 Hypertensive chronic kidney disease with stage 1 through stage 4 chronic kidney disease, or unspecified chronic kidney disease: Secondary | ICD-10-CM | POA: Diagnosis not present

## 2020-10-02 DIAGNOSIS — E1122 Type 2 diabetes mellitus with diabetic chronic kidney disease: Secondary | ICD-10-CM

## 2020-10-02 DIAGNOSIS — E785 Hyperlipidemia, unspecified: Secondary | ICD-10-CM

## 2020-10-02 DIAGNOSIS — E1169 Type 2 diabetes mellitus with other specified complication: Secondary | ICD-10-CM | POA: Diagnosis not present

## 2020-10-02 DIAGNOSIS — Z23 Encounter for immunization: Secondary | ICD-10-CM

## 2020-10-02 MED ORDER — MONTELUKAST SODIUM 10 MG PO TABS: ORAL_TABLET | ORAL | 1 refills | Status: DC

## 2020-10-02 MED ORDER — SERTRALINE HCL 50 MG PO TABS: ORAL_TABLET | ORAL | 1 refills | Status: DC

## 2020-10-02 MED ORDER — AMLODIPINE BESYLATE 5 MG PO TABS: ORAL_TABLET | ORAL | 1 refills | Status: DC

## 2020-10-02 NOTE — Assessment & Plan Note (Signed)
Under good control on current regimen. Continue current regimen. Continue to monitor. Call with any concerns. Refills given. Labs drawn in August and doing well.

## 2020-10-02 NOTE — Progress Notes (Signed)
BP 132/72   Pulse 68   Ht 5' (1.524 m)   Wt 96 lb (43.5 kg)   BMI 18.75 kg/m    Subjective:    Patient ID: Angela Neal, female    DOB: November 28, 1921, 87 y.o.   MRN: [id number]  HPI: Angela Neal is a 87 y.o. female  Chief Complaint  Patient presents with   Hypertension   Diabetes   HYPERTENSION / [place] Satisfied with current treatment? yes Duration of hypertension: chronic BP monitoring frequency: not checking BP medication side effects: no Past BP meds: amlodipine, losartan Duration of hyperlipidemia: chronic Cholesterol medication side effects: no Cholesterol supplements: none Past cholesterol medications: atorvastatin Medication compliance: excellent compliance Aspirin: yes Recent stressors: no Recurrent headaches: no Visual changes: no Palpitations: no Dyspnea: no Chest pain: no Lower extremity edema: no Dizzy/lightheaded: no  DIABETES Hypoglycemic episodes:no Polydipsia/polyuria: no Visual disturbance: no Chest pain: no Paresthesias: no Glucose Monitoring: yes  Accucheck frequency: continuous Taking Insulin?: yes Blood Pressure Monitoring: not checking Retinal Examination: Not up to Date Foot Exam: Up to Date Diabetic Education: Completed Pneumovax: Up to Date Influenza: Up to Date Aspirin: yes  DEPRESSION Mood status: controlled Satisfied with current treatment?: yes Symptom severity: mild  Duration of current treatment : chronic Side effects: no Medication compliance: excellent compliance Psychotherapy/counseling: no  Previous psychiatric medications: sertraline Depressed mood: no Anxious mood: no Anhedonia: no Significant weight loss or gain: no Insomnia: no  Fatigue: no Feelings of worthlessness or guilt: no Impaired concentration/indecisiveness: no Suicidal ideations: no Hopelessness: no Crying spells: no Depression screen [facility] 2/9 10/02/2020 07/02/2020  Decreased Interest 0 0  Down, Depressed, Hopeless 0 0  PHQ -  2 Score 0 0  Altered sleeping 0 -  Tired, decreased energy 0 -  Change in appetite 0 -  Feeling bad or failure about yourself  0 -  Trouble concentrating 0 -  Moving slowly or fidgety/restless 0 -  Suicidal thoughts 0 -  PHQ-9 Score 0 -  Difficult doing work/chores Not difficult at all -    Relevant past medical, surgical, family and social history reviewed and updated as indicated. Interim medical history since our last visit reviewed. Allergies and medications reviewed and updated.  Review of Systems  Constitutional: Negative.   Respiratory: Negative.    Cardiovascular: Negative.   Gastrointestinal: Negative.   Musculoskeletal: Negative.   Neurological: Negative.   Psychiatric/Behavioral: Negative.     Per HPI unless specifically indicated above     Objective:    BP 132/72   Pulse 68   Ht 5' (1.524 m)   Wt 96 lb (43.5 kg)   BMI 18.75 kg/m   Wt Readings from Last 3 Encounters:  10/02/20 96 lb (43.5 kg)  07/02/20 93 lb 3.2 oz (42.3 kg)  08/11/19 87 lb (39.5 kg)    Physical Exam Vitals and nursing note reviewed.  Constitutional:      General: She is not in acute distress.    Appearance: Normal appearance. She is not ill-appearing, toxic-appearing or diaphoretic.  HENT:     Head: Normocephalic and atraumatic.     Right Ear: External ear normal.     Left Ear: External ear normal.     Nose: Nose normal.     Mouth/Throat:     Mouth: Mucous membranes are moist.     Pharynx: Oropharynx is clear.  Eyes:     General: No scleral icterus.       Right eye: No discharge.  Left eye: No discharge.     Extraocular Movements: Extraocular movements intact.     Conjunctiva/sclera: Conjunctivae normal.     Pupils: Pupils are equal, round, and reactive to light.  Cardiovascular:     Rate and Rhythm: Normal rate and regular rhythm.     Pulses: Normal pulses.     Heart sounds: Murmur heard.    No friction rub. No gallop.  Pulmonary:     Effort: Pulmonary effort is  normal. No respiratory distress.     Breath sounds: Normal breath sounds. No stridor. No wheezing, rhonchi or rales.  Chest:     Chest wall: No tenderness.  Musculoskeletal:        General: Normal range of motion.     Cervical back: Normal range of motion and neck supple.  Skin:    General: Skin is warm and dry.     Capillary Refill: Capillary refill takes less than 2 seconds.     Coloration: Skin is not jaundiced or pale.     Findings: No bruising, erythema, lesion or rash.  Neurological:     General: No focal deficit present.     Mental Status: She is alert and oriented to person, place, and time. Mental status is at baseline.  Psychiatric:        Mood and Affect: Mood normal.        Behavior: Behavior normal.        Thought Content: Thought content normal.        Judgment: Judgment normal.    Results for orders placed or performed in visit on 10/02/20  HM DEXA SCAN  Result Value Ref Range   HM Dexa Scan Osteoporosis   Microalbumin, urine  Result Value Ref Range   Microalb, Ur 23.7   Basic metabolic panel  Result Value Ref Range   Glucose 279    BUN 24 (A) 4 - 21   CO2 28 (A) 13 - 22   Creatinine 1.1 0.5 - 1.1   Potassium 4.1 3.4 - 5.3   Sodium 142 137 - 147   Chloride 106 99 - 108  Comprehensive metabolic panel  Result Value Ref Range   Calcium 9.2 8.7 - 10.7  Hemoglobin A1c  Result Value Ref Range   Hemoglobin A1C 7.1       Assessment & Plan:   Problem List Items Addressed This Visit       Endocrine   Type 2 diabetes mellitus with renal complication [facility])    Following with endocrinology. Doing well. Last A1c 7.1- due for eye exam will call.       Hyperlipidemia associated with type 2 diabetes mellitus ([place])    Under good control on current regimen. Continue current regimen. Continue to monitor. Call with any concerns. Refills given.        Relevant Medications   amLODipine (NORVASC) 5 MG tablet     Genitourinary   Benign hypertensive renal disease     Under good control on current regimen. Continue current regimen. Continue to monitor. Call with any concerns. Refills given. Labs drawn in August and doing well.          Other   Depression, recurrent ([place])    Under good control on current regimen. Continue current regimen. Continue to monitor. Call with any concerns. Refills given.        Relevant Medications   sertraline (ZOLOFT) 50 MG tablet   Other Visit Diagnoses     Need for immunization against influenza    -  Primary   Relevant Orders   Flu Vaccine QUAD High Dose(Fluad) (Completed)   Screening for osteoporosis       DEXA ordered today. Due in December.   Relevant Orders   DG Bone Density        Follow up plan: Return in about 1 month (around 11/01/2020).

## 2020-10-02 NOTE — Assessment & Plan Note (Signed)
Under good control on current regimen. Continue current regimen. Continue to monitor. Call with any concerns. Refills given.   

## 2020-10-02 NOTE — Assessment & Plan Note (Signed)
Following with endocrinology. Doing well. Last A1c 7.1- due for eye exam will call.

## 2020-10-02 NOTE — Telephone Encounter (Signed)
Pt left the home health form to be filled out by Dr. Wynetta Emery. Would like it to be faxed with the information provided on sticky note.

## 2020-10-03 NOTE — Telephone Encounter (Signed)
Form has been completed and ready to fax

## 2020-10-10 DIAGNOSIS — E1122 Type 2 diabetes mellitus with diabetic chronic kidney disease: Secondary | ICD-10-CM | POA: Diagnosis not present

## 2020-10-13 ENCOUNTER — Other Ambulatory Visit: Payer: Self-pay | Admitting: Family Medicine

## 2020-10-14 NOTE — Telephone Encounter (Signed)
Requested medication (s) are due for refill today: No  Requested medication (s) are on the active medication list: No  Last refill:  n/a  Future visit scheduled: Yes  Notes to clinic:  Pharmacy request.    Requested Prescriptions  Pending Prescriptions Disp Refills   ULTRACARE PEN NEEDLES 33G X 4 MM [place] [Pharmacy Med Name: ULTRACARE PEN NEEDLES 33G X 4 MM] 100 each     Sig: USE WITH INSULIN PENS     Endocrinology: Diabetes - Testing Supplies Passed - 10/13/2020 11:47 AM      Passed - Valid encounter within last 12 months    Recent Outpatient Visits           1 week ago Need for immunization against influenza   [place], [place], DO   3 months ago Benign hypertensive renal disease   [facility] [place], Barb Merino, DO       Future Appointments             In 3 months Johnson, Barb Merino, DO [facility], [facility]

## 2020-11-06 ENCOUNTER — Ambulatory Visit: Payer: Self-pay | Admitting: *Deleted

## 2020-11-06 NOTE — Telephone Encounter (Signed)
Vaughan Basta, manager care facility, calling and is requesting to have advice on how to operate the pts dexcom. Please advise.   Call forwarded to office for proper instruction of glucose monitoring system.  Reason for Disposition  [1] Caller requesting NON-URGENT health information AND [2] PCP's office is the best resource  Answer Assessment - Initial Assessment Questions 1. REASON FOR CALL or QUESTION: "What is your reason for calling today?" or "How can I best help you?" or "What question do you have that I can help answer?"     Direction needed for use and monitoring of Dexcom devise  Protocols used: Information Only Call - No Triage-A-AH

## 2020-11-06 NOTE — Telephone Encounter (Signed)
Please see below and advise as needed.

## 2020-11-10 DIAGNOSIS — E1122 Type 2 diabetes mellitus with diabetic chronic kidney disease: Secondary | ICD-10-CM | POA: Diagnosis not present

## 2020-11-13 DIAGNOSIS — E113313 Type 2 diabetes mellitus with moderate nonproliferative diabetic retinopathy with macular edema, bilateral: Secondary | ICD-10-CM | POA: Diagnosis not present

## 2020-11-13 DIAGNOSIS — Z794 Long term (current) use of insulin: Secondary | ICD-10-CM | POA: Diagnosis not present

## 2020-11-21 DIAGNOSIS — M810 Age-related osteoporosis without current pathological fracture: Secondary | ICD-10-CM | POA: Diagnosis not present

## 2020-11-21 DIAGNOSIS — E113313 Type 2 diabetes mellitus with moderate nonproliferative diabetic retinopathy with macular edema, bilateral: Secondary | ICD-10-CM | POA: Diagnosis not present

## 2020-11-21 DIAGNOSIS — E1129 Type 2 diabetes mellitus with other diabetic kidney complication: Secondary | ICD-10-CM | POA: Diagnosis not present

## 2020-11-21 DIAGNOSIS — N1831 Chronic kidney disease, stage 3a: Secondary | ICD-10-CM | POA: Diagnosis not present

## 2020-11-21 DIAGNOSIS — R809 Proteinuria, unspecified: Secondary | ICD-10-CM | POA: Diagnosis not present

## 2020-11-21 DIAGNOSIS — Z794 Long term (current) use of insulin: Secondary | ICD-10-CM | POA: Diagnosis not present

## 2020-11-21 DIAGNOSIS — E1122 Type 2 diabetes mellitus with diabetic chronic kidney disease: Secondary | ICD-10-CM | POA: Diagnosis not present

## 2020-11-26 ENCOUNTER — Other Ambulatory Visit: Payer: Self-pay | Admitting: Family Medicine

## 2020-11-26 NOTE — Telephone Encounter (Signed)
Requested medication (s) are due for refill today  Yes  Requested medication (s) are on the active medication list   yes  Future visit scheduled Yes 01/26/21  Note to clinic-Last ordered by historical provider. Routing to provider for review.    Requested Prescriptions  Pending Prescriptions Disp Refills   atorvastatin (LIPITOR) 40 MG tablet [Pharmacy Med Name: ATORVASTATIN CALCIUM 40 MG TAB] 30 tablet     Sig: TAKE 1 TABLET BY MOUTH ONCE EVERY DAY     Cardiovascular:  Antilipid - Statins Failed - 11/26/2020 11:56 AM      Failed - Total Cholesterol in normal range and within 360 days    No results found for: CHOL, POCCHOL, CHOLTOT        Failed - LDL in normal range and within 360 days    No results found for: LDLCALC, LDLC, HIRISKLDL, POCLDL, LDLDIRECT, REALLDLC, TOTLDLC        Failed - HDL in normal range and within 360 days    No results found for: HDL, POCHDL        Failed - Triglycerides in normal range and within 360 days    No results found for: TRIG, POCTRIG        Passed - Patient is not pregnant      Passed - Valid encounter within last 12 months    Recent Outpatient Visits           1 month ago Need for immunization against influenza   [place], Megan P, DO   4 months ago Benign hypertensive renal disease   [facility] [place], Megan P, DO       Future Appointments             In 2 months Johnson, Megan P, DO [facility]             Psyllium Fiber 0.52 g CAPS [Pharmacy Med Name: PSYLLIUM FIBER 0.52 GM CAP] 30 capsule     Sig: TAKE 1 CAPSULE BY MOUTH ONCE DAILY     Over the Counter:  OTC Passed - 11/26/2020 11:56 AM      Passed - Valid encounter within last 12 months    Recent Outpatient Visits           1 month ago Need for immunization against influenza   [place], [place], DO   4 months ago Benign hypertensive renal disease   [facility] [place],  Barb Merino, DO       Future Appointments             In 2 months Wynetta Emery, Barb Merino, DO [facility]

## 2020-12-10 DIAGNOSIS — E1122 Type 2 diabetes mellitus with diabetic chronic kidney disease: Secondary | ICD-10-CM | POA: Diagnosis not present

## 2020-12-27 ENCOUNTER — Ambulatory Visit
Admission: EM | Admit: 2020-12-27 | Discharge: 2020-12-27 | Disposition: A | Discharge status: Home / Self Care | Payer: HMO | Source: Home / Self Care

## 2020-12-27 ENCOUNTER — Emergency Department: Payer: HMO

## 2020-12-27 ENCOUNTER — Emergency Department
Admission: EM | Admit: 2020-12-27 | Discharge: 2020-12-27 | Disposition: A | Discharge status: Home / Self Care | Payer: HMO | Attending: Emergency Medicine | Admitting: Emergency Medicine

## 2020-12-27 ENCOUNTER — Other Ambulatory Visit: Payer: Self-pay

## 2020-12-27 DIAGNOSIS — E1122 Type 2 diabetes mellitus with diabetic chronic kidney disease: Secondary | ICD-10-CM | POA: Insufficient documentation

## 2020-12-27 DIAGNOSIS — R509 Fever, unspecified: Secondary | ICD-10-CM | POA: Diagnosis not present

## 2020-12-27 DIAGNOSIS — E860 Dehydration: Secondary | ICD-10-CM | POA: Insufficient documentation

## 2020-12-27 DIAGNOSIS — N183 Chronic kidney disease, stage 3 unspecified: Secondary | ICD-10-CM | POA: Diagnosis not present

## 2020-12-27 DIAGNOSIS — R739 Hyperglycemia, unspecified: Secondary | ICD-10-CM

## 2020-12-27 DIAGNOSIS — R824 Acetonuria: Secondary | ICD-10-CM | POA: Diagnosis not present

## 2020-12-27 DIAGNOSIS — Z79899 Other long term (current) drug therapy: Secondary | ICD-10-CM | POA: Insufficient documentation

## 2020-12-27 DIAGNOSIS — J45909 Unspecified asthma, uncomplicated: Secondary | ICD-10-CM | POA: Diagnosis not present

## 2020-12-27 DIAGNOSIS — U071 COVID-19: Secondary | ICD-10-CM | POA: Diagnosis not present

## 2020-12-27 DIAGNOSIS — J449 Chronic obstructive pulmonary disease, unspecified: Secondary | ICD-10-CM | POA: Insufficient documentation

## 2020-12-27 DIAGNOSIS — I129 Hypertensive chronic kidney disease with stage 1 through stage 4 chronic kidney disease, or unspecified chronic kidney disease: Secondary | ICD-10-CM | POA: Diagnosis not present

## 2020-12-27 DIAGNOSIS — R8290 Unspecified abnormal findings in urine: Secondary | ICD-10-CM | POA: Diagnosis not present

## 2020-12-27 DIAGNOSIS — Z7952 Long term (current) use of systemic steroids: Secondary | ICD-10-CM | POA: Insufficient documentation

## 2020-12-27 DIAGNOSIS — Z87891 Personal history of nicotine dependence: Secondary | ICD-10-CM | POA: Insufficient documentation

## 2020-12-27 DIAGNOSIS — I959 Hypotension, unspecified: Secondary | ICD-10-CM | POA: Diagnosis not present

## 2020-12-27 DIAGNOSIS — R5081 Fever presenting with conditions classified elsewhere: Secondary | ICD-10-CM

## 2020-12-27 DIAGNOSIS — Z7982 Long term (current) use of aspirin: Secondary | ICD-10-CM | POA: Insufficient documentation

## 2020-12-27 LAB — URINALYSIS, COMPLETE (UACMP) WITH MICROSCOPIC
Bilirubin Urine: NEGATIVE
Glucose, UA: NEGATIVE mg/dL
Hgb urine dipstick: NEGATIVE
Ketones, ur: 15 mg/dL — AB
Leukocytes,Ua: NEGATIVE
Nitrite: NEGATIVE
Protein, ur: 100 mg/dL — AB
Specific Gravity, Urine: >1.03 — ABNORMAL HIGH (ref 1.005–1.030)
pH: 5 (ref 5.0–8.0)

## 2020-12-27 LAB — COMPREHENSIVE METABOLIC PANEL
ALT: 40 U/L (ref 0–44)
AST: 25 U/L (ref 15–41)
Albumin: 3.9 g/dL (ref 3.5–5.0)
Alkaline Phosphatase: 84 U/L (ref 38–126)
Anion gap: 12 (ref 5–15)
BUN: 22 mg/dL (ref 8–23)
CO2: 21 mmol/L — ABNORMAL LOW (ref 22–32)
Calcium: 9 mg/dL (ref 8.9–10.3)
Chloride: 103 mmol/L (ref 98–111)
Creatinine, Ser: 1.18 mg/dL — ABNORMAL HIGH (ref 0.44–1.00)
GFR, Estimated: 42 mL/min — ABNORMAL LOW (ref >60)
Glucose, Bld: 275 mg/dL — ABNORMAL HIGH (ref 70–99)
Potassium: 3.7 mmol/L (ref 3.5–5.1)
Sodium: 136 mmol/L (ref 135–145)
Total Bilirubin: 1.2 mg/dL (ref 0.3–1.2)
Total Protein: 7.4 g/dL (ref 6.5–8.1)

## 2020-12-27 LAB — CBC WITH DIFFERENTIAL/PLATELET
Abs Immature Granulocytes: 0.02 10*3/uL (ref 0.00–0.07)
Basophils Absolute: 0 10*3/uL (ref 0.0–0.1)
Basophils Relative: 0 %
Eosinophils Absolute: 0 10*3/uL (ref 0.0–0.5)
Eosinophils Relative: 0 %
HCT: 37.1 % (ref 36.0–46.0)
Hemoglobin: 12.1 g/dL (ref 12.0–15.0)
Immature Granulocytes: 0 %
Lymphocytes Relative: 3 %
Lymphs Abs: 0.3 10*3/uL — ABNORMAL LOW (ref 0.7–4.0)
MCH: 30.4 pg (ref 26.0–34.0)
MCHC: 32.6 g/dL (ref 30.0–36.0)
MCV: 93.2 fL (ref 80.0–100.0)
Monocytes Absolute: 0.8 10*3/uL (ref 0.1–1.0)
Monocytes Relative: 9 %
Neutro Abs: 8.4 10*3/uL — ABNORMAL HIGH (ref 1.7–7.7)
Neutrophils Relative %: 88 %
Platelets: 121 10*3/uL — ABNORMAL LOW (ref 150–400)
RBC: 3.98 MIL/uL (ref 3.87–5.11)
RDW: 13.9 % (ref 11.5–15.5)
WBC: 9.7 10*3/uL (ref 4.0–10.5)
nRBC: 0 % (ref 0.0–0.2)

## 2020-12-27 LAB — GLUCOSE, CAPILLARY: Glucose-Capillary: 242 mg/dL — ABNORMAL HIGH (ref 70–99)

## 2020-12-27 LAB — RESP PANEL BY RT-PCR (FLU A&B, COVID) ARPGX2
Influenza A by PCR: NEGATIVE
Influenza B by PCR: NEGATIVE
SARS Coronavirus 2 by RT PCR: POSITIVE — AB

## 2020-12-27 MED ORDER — LACTATED RINGERS IV BOLUS
1000.0000 mL | Freq: Once | INTRAVENOUS | Status: AC
  Administered 2020-12-27: 17:00:00 1000 mL via INTRAVENOUS

## 2020-12-27 MED ORDER — NIRMATRELVIR/RITONAVIR (PAXLOVID) TABLET (RENAL DOSING)
2.0000 | ORAL_TABLET | Freq: Two times a day (BID) | ORAL | Status: DC
  Administered 2020-12-27: 19:00:00 2 via ORAL
  Filled 2020-12-27: qty 20

## 2020-12-27 NOTE — ED Triage Notes (Signed)
Pt is with her daughter. Her daugher states that she has a loss of appetite. Blood sugar was 381 this morning. Temperature of 101. Pt was given tylenol at 1pm today. Blood Sugar came down to 232 before lunchtime dose of insulin.  Patient has urinary odor, holding her head as if she has a headache.

## 2020-12-27 NOTE — ED Notes (Signed)
Patient is being discharged from the Urgent Care and sent to the [facility] Emergency Department via private vehicle . Per Laurene Footman, PA, patient is in need of higher level of care due to elevated BG and hypotensive. Patient is aware and verbalizes understanding of plan of care.  Vitals:   12/27/20 1530  BP: (!) 82/60  Pulse: 100  Resp: 18  Temp: 99.3 F (37.4 C)  SpO2: 98%

## 2020-12-27 NOTE — Discharge Instructions (Addendum)
Use Tylenol for pain and fevers.  Up to 1000 mg per dose, up to 4 times per day.  Do not take more than 4000 mg of Tylenol/acetaminophen within 24 hours..  For the Paxlovid reduced dosing due to her kidney function, 150 mg,, give 2 pills at a time, twice daily for the next 5 days.  Push fluids to keep her hydrated.  She may not have much of an appetite for a few days.    Do not take your atorvastatin/Lipitor while taking the Paxlovid, you can restart this medicine once the Paxlovid is finished.  If you develop significantly worsening breathing, throwing up and inability to keep anything down then please return to the ED.

## 2020-12-27 NOTE — ED Notes (Signed)
Patient denies any pain at this time. Daughter states fever and weakness/lack of appetite starting today.

## 2020-12-27 NOTE — ED Triage Notes (Signed)
Pt with daughter who states pt has had a fever of 101 and has had a loss pf appetite- pt was seen at [facility] in [facility] and was found to have a low bp- pt cbg was also higher than normal this AM around 320

## 2020-12-27 NOTE — Discharge Instructions (Signed)

## 2020-12-27 NOTE — ED Provider Notes (Signed)
[facility]    CSN: [id number] Arrival date & time: 12/27/20  1514      History   Chief Complaint Chief Complaint  Patient presents with   Urinary Odor    HPI Angela Neal is a 87 y.o. female who has been brought in by her daughter for concerns about fever up to 101 degrees, fatigue, reduced appetite, elevated blood sugars up to 300s.  Patient started feeling ill today and that fever started today.  Daughter states she noticed an odor to the urine.  Patient apparently has been holding her head like she has possible headache or pain somewhere.  Daughter reports she has difficulty communicating sometimes.  She has a hearing problem.  Daughter says she always coughs and that is due to "her heart."  She has not had any recent change in her cough and no breathing difficulty.  No vomiting or diarrhea.  Patient not reporting any abdominal pain.  No sick contacts or known exposure to flu or COVID.  Patient's medical history significant for stage III chronic kidney disease, COPD, type 2 diabetes on insulin, previous stroke and hypertension.  HPI  Past Medical History:  Diagnosis Date   Asthma    COPD (chronic obstructive pulmonary disease) ([place])    Diabetes mellitus without complication ([place])    Hypertension    Macular degeneration    Stroke [facility])     Patient Active Problem List   Diagnosis Date Noted   COPD (chronic obstructive pulmonary disease) ([place]) 07/07/2020   Type 2 diabetes mellitus with renal complication ([place]) 62/22/9798   Macular degeneration 07/07/2020   Benign hypertensive renal disease 07/07/2020   Hyperlipidemia associated with type 2 diabetes mellitus ([place]) 07/07/2020   Depression, recurrent ([place]) 07/07/2020   Stage 3 chronic kidney disease ([place]) 07/07/2020   Carotid bruit 07/07/2020   Nonrheumatic aortic valve stenosis 07/07/2020   Multinodular goiter 07/07/2020   Adrenal adenoma 07/07/2020   Diverticulosis 07/07/2020    Past Surgical History:   Procedure Laterality Date   ABDOMINAL HYSTERECTOMY     HIP ARTHROPLASTY      OB History   No obstetric history on file.      Home Medications    Prior to Admission medications   Medication Sig Start Date End Date Taking? Authorizing Provider  albuterol (VENTOLIN HFA) 108 (90 Base) MCG/ACT inhaler Inhale 1-2 puffs into the lungs every 6 (six) hours as needed for wheezing or shortness of breath.   Yes [provider]  amLODipine (NORVASC) 5 MG tablet TAKE 1 TABLET BY MOUTH ONCE EVERY DAY 10/02/20  Yes Johnson, Megan P, DO  aspirin 81 MG EC tablet Take 81 mg by mouth daily.   Yes [provider]  atorvastatin (LIPITOR) 40 MG tablet TAKE 1 TABLET BY MOUTH ONCE EVERY DAY 11/26/20  Yes Johnson, Megan P, DO  Cholecalciferol 25 MCG (1000 UT) tablet Take 1,000 Units by mouth daily. 01/09/19  Yes [provider]  JANUVIA 50 MG tablet Take 50 mg by mouth daily. 04/27/19  Yes [provider]  LEVEMIR FLEXTOUCH 100 UNIT/ML FlexPen Inject 7 Units into the skin at bedtime. 03/21/19  Yes [provider]  losartan (COZAAR) 100 MG tablet Take 100 mg by mouth daily. 04/03/19  Yes [provider]  montelukast (SINGULAIR) 10 MG tablet TAKE 1 TABLET(10 MG) BY MOUTH NIGHTLY 10/02/20  Yes Johnson, Megan P, DO  Multiple Vitamins-Minerals (PRESERVISION AREDS 2) CAPS Take 1 capsule by mouth 2 (two) times daily. 07/02/20  Yes Johnson, Megan P, DO  NOVOLOG FLEXPEN 100 UNIT/ML FlexPen Inject 4 Units into the skin 3 (three) times daily before meals. (plus up to 5u sliding scale correction) 03/21/19  Yes [provider]  Psyllium (METAMUCIL) 0.36 g CAPS Take 1 capsule by mouth daily. 06/03/20  Yes [provider]  Psyllium Fiber 0.52 g CAPS TAKE 1 CAPSULE BY MOUTH ONCE DAILY 11/26/20  Yes Johnson, Megan P, DO  sertraline (ZOLOFT) 50 MG tablet TAKE 1.5 TABLETS (75 MG TOTAL) BY MOUTH ONCE DAILY 10/02/20  Yes Johnson, Megan P, DO  ULTRACARE PEN NEEDLES 33G X 4  MM MISC USE WITH INSULIN PENS 10/14/20  Yes Johnson, Megan P, DO  alendronate (FOSAMAX) 70 MG tablet Take 70 mg by mouth every Saturday.  04/03/19   [provider]  fluticasone (FLONASE) 50 MCG/ACT nasal spray Place 1-2 sprays into both nostrils daily as needed for allergies or rhinitis.    [provider]    Family History Family History  Problem Relation Age of Onset   Arthritis Mother    Heart disease Mother     Social History Social History   Tobacco Use   Smoking status: Former   Smokeless tobacco: Never  Scientific laboratory technician Use: Never used  Substance Use Topics   Alcohol use: Never   Drug use: Never     Allergies   Sulfa antibiotics   Review of Systems Review of Systems  Constitutional:  Positive for appetite change, fatigue and fever.  HENT:  Negative for congestion.   Respiratory:  Positive for cough (chronic). Negative for shortness of breath and wheezing.   Gastrointestinal:  Positive for abdominal pain. Negative for diarrhea and vomiting.  Genitourinary:  Positive for decreased urine volume. Negative for dysuria, flank pain, frequency, hematuria and urgency.  Musculoskeletal:  Negative for back pain.  Skin:  Negative for rash.  Neurological:  Positive for weakness.    Physical Exam Triage Vital Signs ED Triage Vitals  Enc Vitals Group     BP --      Pulse --      Resp --      Temp --      Temp Source 12/27/20 1530 Oral     SpO2 --      Weight 12/27/20 1528 97 lb (44 kg)     Height 12/27/20 1528 5' (1.524 m)     Head Circumference --      Peak Flow --      Pain Score 12/27/20 1527 0     Pain Loc --      Pain Edu? --      Excl. in [place]? --    No data found.  Updated Vital Signs BP (!) 82/60 (BP Location: Left Arm)    Pulse 100    Temp 99.3 F (37.4 C) (Oral)    Resp 18    Ht 5' (1.524 m)    Wt 97 lb (44 kg)    SpO2 98%    BMI 18.94 kg/m     Physical Exam Vitals and nursing note reviewed.  Constitutional:      General:  She is not in acute distress.    Appearance: Normal appearance. She is not ill-appearing or toxic-appearing.     Comments: frail  HENT:     Head: Normocephalic and atraumatic.     Nose: Nose normal.     Mouth/Throat:     Mouth: Mucous membranes are moist.     Pharynx:  Oropharynx is clear.  Eyes:     General: No scleral icterus.       Right eye: No discharge.        Left eye: No discharge.     Conjunctiva/sclera: Conjunctivae normal.  Cardiovascular:     Rate and Rhythm: Normal rate and regular rhythm.     Heart sounds: Normal heart sounds.  Pulmonary:     Effort: Pulmonary effort is normal. No respiratory distress.     Breath sounds: Normal breath sounds. No wheezing, rhonchi or rales.  Abdominal:     Palpations: Abdomen is soft.     Tenderness: There is no abdominal tenderness. There is no right CVA tenderness or left CVA tenderness.  Musculoskeletal:     Cervical back: Neck supple.  Skin:    General: Skin is dry.  Neurological:     General: No focal deficit present.     Mental Status: She is alert. Mental status is at baseline.     Motor: No weakness.     Gait: Gait normal.  Psychiatric:        Mood and Affect: Mood normal.        Behavior: Behavior normal.        Thought Content: Thought content normal.     UC Treatments / Results  Labs (all labs ordered are listed, but only abnormal results are displayed) Labs Reviewed  URINALYSIS, COMPLETE (UACMP) WITH MICROSCOPIC - Abnormal; Notable for the following components:      Result Value   Color, Urine BLUE (*)    APPearance TURBID (*)    Specific Gravity, Urine >1.030 (*)    Ketones, ur 15 (*)    Protein, ur 100 (*)    Bacteria, UA FEW (*)    All other components within normal limits  GLUCOSE, CAPILLARY - Abnormal; Notable for the following components:   Glucose-Capillary 242 (*)    All other components within normal limits  URINE CULTURE  CBG MONITORING, ED    EKG   Radiology No results  found.  Procedures Procedures (including critical care time)  Medications Ordered in UC Medications - No data to display  Initial Impression / Assessment and Plan / UC Course  I have reviewed the triage vital signs and the nursing notes.  Pertinent labs & imaging results that were available during my care of the patient were reviewed by me and considered in my medical decision making (see chart for details).  87 year old female presenting with daughter for fever, fatigue, weakness, urine odor.  Symptom onset today.  Elevated blood sugars in the 300s.  BP is 82/60.  Temp is currently 99.3 degrees the patient has reportedly had Tylenol in the last couple of hours.  Remainder of vitals are normal and stable.  She is not ill-appearing.  She is frail.  No congestion.  Chest clear auscultation heart regular rate and rhythm.  No abdominal tenderness or CVA tenderness.  Urine sample today is greater than 1.030 specific gravity, with protein and ketones as well as bacteria.  No nitrites or leukocytes.  Fingerstick is 242.  Advised patient's daughter there is no clear UTI based on the urine sample and I am concerned that she is hypotensive, hyperglycemic and has ketones in urine.  Advise she needs further work-up for the fever as well.  Advised going to emergency department at this time where she can have labs, fluids and further testing to find out the source of the fever.  Daughter taking her to [facility]  at this time.  Patient leaving in stable condition.   Final Clinical Impressions(s) / UC Diagnoses   Final diagnoses:  Fever, unspecified  Hypotension, unspecified hypotension type  Hyperglycemia  Ketonuria  Abnormal urine odor  Dehydration     Discharge Instructions      You have been advised to follow up immediately in the emergency department for concerning signs.symptoms. If you declined EMS transport, please have a family member take you directly to the ED at this time. Do not delay.  Based on concerns about condition, if you do not follow up in th e ED, you may risk poor outcomes including worsening of condition, delayed treatment and potentially life threatening issues. If you have declined to go to the ED at this time, you should call your PCP immediately to set up a follow up appointment.  Go to ED for red flag symptoms, including; fevers you cannot reduce with Tylenol/Motrin, severe headaches, vision changes, numbness/weakness in part of the body, lethargy, confusion, intractable vomiting, severe dehydration, chest pain, breathing difficulty, severe persistent abdominal or pelvic pain, signs of severe infection (increased redness, swelling of an area), feeling faint or passing out, dizziness, etc. You should especially go to the ED for sudden acute worsening of condition if you do not elect to go at this time.      ED Prescriptions   None    PDMP not reviewed this encounter.   Danton Clap, PA-C 12/27/20 1610

## 2020-12-27 NOTE — ED Provider Notes (Signed)
[facility] Emergency Department Provider Note ____________________________________________   Event Date/Time   First MD Initiated Contact with Patient 12/27/20 1654     (approximate)  I have reviewed the triage vital signs and the nursing notes.  HISTORY  Chief Complaint Fever   HPI Angela Neal is a 87 y.o. femalewho presents to the ED for evaluation of fever.  Chart review indicates DM, CKD, HTN and HLD.  COPD. Patient resides at a local private care home but daughter brought her home this weekend for the holidays to be with family temporarily.  She is ambulatory with a walker.  Daughter brings patient to the ED for evaluation of generalized weakness, hyperglycemia and fever at home today.  Symptoms are started today, she was seen 2 to 3 days ago by family members at the facility and she was at her baseline then.  They report that she has been more tired and sleeping more today than typical.  Her sugars have been higher as well in the 300s.  They also noted a fever of 101 degrees, treated with Tylenol with improvement.  Due to these derangements, they brought her to urgent care for evaluation.  I reviewed the urinalysis from urgent care with small ketonuria and high specific gravity, but no WBCs to suggest infection.  Here in the ED, patient reports no complaints.  She says she feels fine and has no pain anywhere.  Denies abdominal pain, nausea, emesis, headache, cough or shortness of breath.  Denies chest pain or syncope or falls.  Past Medical History:  Diagnosis Date   Asthma    COPD (chronic obstructive pulmonary disease) ([place])    Diabetes mellitus without complication ([place])    Hypertension    Macular degeneration    Stroke [facility])     Patient Active Problem List   Diagnosis Date Noted   COPD (chronic obstructive pulmonary disease) ([place]) 07/07/2020   Type 2 diabetes mellitus with renal complication ([place]) 38/75/6433   Macular degeneration  07/07/2020   Benign hypertensive renal disease 07/07/2020   Hyperlipidemia associated with type 2 diabetes mellitus ([place]) 07/07/2020   Depression, recurrent ([place]) 07/07/2020   Stage 3 chronic kidney disease ([place]) 07/07/2020   Carotid bruit 07/07/2020   Nonrheumatic aortic valve stenosis 07/07/2020   Multinodular goiter 07/07/2020   Adrenal adenoma 07/07/2020   Diverticulosis 07/07/2020    Past Surgical History:  Procedure Laterality Date   ABDOMINAL HYSTERECTOMY     HIP ARTHROPLASTY      Prior to Admission medications   Medication Sig Start Date End Date Taking? Authorizing Provider  amLODipine (NORVASC) 5 MG tablet TAKE 1 TABLET BY MOUTH ONCE EVERY DAY Patient taking differently: Take 5 mg by mouth daily. TAKE 1 TABLET BY MOUTH ONCE EVERY DAY 10/02/20  Yes Johnson, Megan P, DO  aspirin 81 MG EC tablet Take 81 mg by mouth daily.   Yes [provider]  atorvastatin (LIPITOR) 40 MG tablet TAKE 1 TABLET BY MOUTH ONCE EVERY DAY Patient taking differently: Take 40 mg by mouth daily. 11/26/20  Yes Johnson, Megan P, DO  Cholecalciferol 25 MCG (1000 UT) tablet Take 1,000 Units by mouth daily. 01/09/19  Yes [provider]  JANUVIA 100 MG tablet Take 100 mg by mouth daily. 12/23/20  Yes [provider]  LEVEMIR FLEXTOUCH 100 UNIT/ML FlexPen Inject 7 Units into the skin at bedtime. 03/21/19  Yes [provider]  losartan (COZAAR) 100 MG tablet Take 100 mg by mouth daily. 04/03/19  Yes [provider]  montelukast (SINGULAIR) 10 MG tablet TAKE 1 TABLET(10 MG) BY MOUTH NIGHTLY Patient taking differently: Take 10 mg by mouth at bedtime. TAKE 1 TABLET(10 MG) BY MOUTH NIGHTLY 10/02/20  Yes Johnson, Megan P, DO  Multiple Vitamins-Minerals (PRESERVISION AREDS 2) CAPS Take 1 capsule by mouth 2 (two) times daily. 07/02/20  Yes Johnson, Megan P, DO  NOVOLOG FLEXPEN 100 UNIT/ML FlexPen Inject 4 Units into the skin 3 (three) times daily before meals. (plus up to 5u  sliding scale correction) 03/21/19  Yes [provider]  Psyllium Fiber 0.52 g CAPS TAKE 1 CAPSULE BY MOUTH ONCE DAILY Patient taking differently: Take 1 capsule by mouth daily at 12 noon. 11/26/20  Yes Johnson, Megan P, DO  sertraline (ZOLOFT) 50 MG tablet TAKE 1.5 TABLETS (75 MG TOTAL) BY MOUTH ONCE DAILY Patient taking differently: Take 75 mg by mouth daily. TAKE 1.5 TABLETS (75 MG TOTAL) BY MOUTH ONCE DAILY 10/02/20  Yes Johnson, Megan P, DO  albuterol (VENTOLIN HFA) 108 (90 Base) MCG/ACT inhaler Inhale 1-2 puffs into the lungs every 6 (six) hours as needed for wheezing or shortness of breath. Patient not taking: Reported on 12/27/2020    [provider]  alendronate (FOSAMAX) 70 MG tablet Take 70 mg by mouth every Saturday.  Patient not taking: Reported on 12/27/2020 04/03/19   [provider]  fluticasone (FLONASE) 50 MCG/ACT nasal spray Place 1-2 sprays into both nostrils daily as needed for allergies or rhinitis. Patient not taking: Reported on 12/27/2020    [provider]  ULTRACARE PEN NEEDLES 33G X 4 MM MISC USE WITH INSULIN PENS 10/14/20   Park Liter P, DO    Allergies Sulfa antibiotics  Family History  Problem Relation Age of Onset   Arthritis Mother    Heart disease Mother     Social History Social History   Tobacco Use   Smoking status: Former   Smokeless tobacco: Never  Scientific laboratory technician Use: Never used  Substance Use Topics   Alcohol use: Never   Drug use: Never    Review of Systems  Constitutional: Positive for fever at home. Eyes: No visual changes. ENT: No sore throat. Cardiovascular: Denies chest pain. Respiratory: Denies shortness of breath. Gastrointestinal: No abdominal pain.  No nausea, no vomiting.  No diarrhea.  No constipation. Positive for lesser p.o. intake Genitourinary: Negative for dysuria. Musculoskeletal: Negative for back pain. Skin: Negative for rash. Neurological: Negative for headaches,  focal weakness or numbness. ____________________________________________   PHYSICAL EXAM:  VITAL SIGNS: Vitals:   12/27/20 1800 12/27/20 1830  BP: 112/69 100/65  Pulse: (!) 104   Resp: 18   Temp:    SpO2: 98%     Constitutional: Alert . Well appearing and in no acute distress.  Frail and small.  Pleasant without distress. Eyes: Conjunctivae are normal. PERRL. EOMI. Head: Atraumatic. Nose: No congestion/rhinnorhea. Mouth/Throat: Mucous membranes are somewhat dry.  Oropharynx non-erythematous. Neck: No stridor. No cervical spine tenderness to palpation. Cardiovascular: Normal rate, regular rhythm.  Blowing systolic murmur is noted.Kermit Balo peripheral circulation. Respiratory: Normal respiratory effort.  No retractions. Lungs CTAB. Gastrointestinal: Soft , nondistended, nontender to palpation. No CVA tenderness. Musculoskeletal: No lower extremity tenderness nor edema.  No joint effusions. No signs of acute trauma. Neurologic:  Normal speech and language. No gross focal neurologic deficits are appreciated. No gait instability noted. Skin:  Skin is warm, dry and intact. No rash noted. Psychiatric: Mood and affect are normal. Speech  and behavior are normal. ____________________________________________   LABS (all labs ordered are listed, but only abnormal results are displayed)  Labs Reviewed  RESP PANEL BY RT-PCR (FLU A&B, COVID) ARPGX2 - Abnormal; Notable for the following components:      Result Value   SARS Coronavirus 2 by RT PCR POSITIVE (*)    All other components within normal limits  COMPREHENSIVE METABOLIC PANEL - Abnormal; Notable for the following components:   CO2 21 (*)    Glucose, Bld 275 (*)    Creatinine, Ser 1.18 (*)    GFR, Estimated 42 (*)    All other components within normal limits  CBC WITH DIFFERENTIAL/PLATELET - Abnormal; Notable for the following components:   Platelets 121 (*)    Neutro Abs 8.4 (*)    Lymphs Abs 0.3 (*)    All other components  within normal limits   ____________________________________________  12 Lead EKG  Sinus rhythm with a rate of 94 bpm.  Leftward axis and normal intervals.  Poor quality EKG with nonspecific ST changes without STEMI criteria. ____________________________________________  RADIOLOGY  ED MD interpretation:  CXR reviewed by me without evidence of acute cardiopulmonary pathology.  Official radiology report(s): DG Chest Portable 1 View  Result Date: 12/27/2020 CLINICAL DATA:  Fever and decreased appetite. Asthma/COPD. Diabetes. Ex-smoker. EXAM: PORTABLE CHEST 1 VIEW COMPARISON:  05/01/2019 FINDINGS: Numerous leads and wires project over the chest. Probable bone infarct in the left humeral head, similar. Midline trachea. Mild cardiomegaly. Atherosclerosis in the transverse aorta. Tortuous thoracic aorta. No pleural effusion or pneumothorax. Skin fold over the right hemithorax laterally. Chronic interstitial coarsening. Left base scarring. IMPRESSION: No interval change or acute process compared to 05/01/2019. Cardiomegaly with chronic interstitial coarsening consistent with COPD/chronic bronchitis. Aortic Atherosclerosis (ICD10-I70.0). Cardiomegaly. Electronically Signed   By: Abigail Miyamoto M.D.   On: 12/27/2020 17:43    ____________________________________________   PROCEDURES and INTERVENTIONS  Procedure(s) performed (including Critical Care):  .1-3 Lead EKG Interpretation Performed by: Vladimir Crofts, MD Authorized by: Vladimir Crofts, MD     Interpretation: normal     ECG rate:  92   ECG rate assessment: normal     Rhythm: sinus rhythm     Ectopy: none     Conduction: normal    Medications  nirmatrelvir/ritonavir EUA (renal dosing) (PAXLOVID) 2 tablet (2 tablets Oral Given 12/27/20 1833)  lactated ringers bolus 1,000 mL (0 mLs Intravenous Stopped 12/27/20 1837)    ____________________________________________   MDM / ED COURSE   87 year old female presents to the ED with her  daughter due to fever and generalized weakness, found with evidence of acute COVID-19 and amenable to outpatient management with initiation of Paxlovid.  Soft pressures on arrival that improved with IV fluids.  She looks clinically well and has no complaints.  No signs of focal neurologic or vascular deficits.  No signs of respiratory distress, wheezing or signs of COPD exacerbation.  CXR without infiltrates or signs of CAP.  Mild hyperglycemia without stigmata of DKA.  Testing positive for COVID as likely etiology of her symptoms.  No barriers to outpatient management at this time.  We will discharge with prescription for Paxlovid and return precautions for the ED.  Clinical Course as of 12/27/20 1853  Sat Dec 27, 2020  1717 Discussed plan of care with the daughter and patient, including COVID/flu swab, basic labs, CXR and IV fluids.  They are agreeable. [DS]  6160 Updated daughter and patient.  [DS]    Clinical Course User Index [  DS] Vladimir Crofts, MD    ____________________________________________   FINAL CLINICAL IMPRESSION(S) / ED DIAGNOSES  Final diagnoses:  COVID-19  Fever in other diseases     ED Discharge Orders     None        Arihanna Estabrook Tamala Julian   Note:  This document was prepared using Dragon voice recognition software and may include unintentional dictation errors.    Vladimir Crofts, MD 12/27/20 [phone]

## 2020-12-28 LAB — URINE CULTURE: Culture: <10000 — AB

## 2021-01-06 DIAGNOSIS — U071 COVID-19: Secondary | ICD-10-CM | POA: Diagnosis not present

## 2021-01-06 DIAGNOSIS — R053 Chronic cough: Secondary | ICD-10-CM | POA: Diagnosis not present

## 2021-01-06 DIAGNOSIS — I517 Cardiomegaly: Secondary | ICD-10-CM | POA: Diagnosis not present

## 2021-01-10 DIAGNOSIS — E1122 Type 2 diabetes mellitus with diabetic chronic kidney disease: Secondary | ICD-10-CM | POA: Diagnosis not present

## 2021-01-24 ENCOUNTER — Other Ambulatory Visit: Payer: Self-pay | Admitting: Family Medicine

## 2021-01-24 NOTE — Telephone Encounter (Signed)
Requested medication (s) are due for refill today: yes  Requested medication (s) are on the active medication list: yes  Last refill:  11/26/20#90  Future visit scheduled: yes  Notes to clinic:  no lab work noted   Requested Prescriptions  Pending Prescriptions Disp Refills   atorvastatin (LIPITOR) 40 MG tablet [Pharmacy Med Name: ATORVASTATIN CALCIUM 40 MG TAB] 90 tablet 0    Sig: TAKE 1 TABLET BY MOUTH ONCE EVERY DAY     Cardiovascular:  Antilipid - Statins Failed - 01/24/2021 11:13 AM      Failed - Total Cholesterol in normal range and within 360 days    No results found for: CHOL, POCCHOL, CHOLTOT        Failed - LDL in normal range and within 360 days    No results found for: LDLCALC, LDLC, HIRISKLDL, POCLDL, LDLDIRECT, REALLDLC, TOTLDLC        Failed - HDL in normal range and within 360 days    No results found for: HDL, POCHDL        Failed - Triglycerides in normal range and within 360 days    No results found for: TRIG, POCTRIG        Passed - Patient is not pregnant      Passed - Valid encounter within last 12 months    Recent Outpatient Visits           3 months ago Need for immunization against influenza   [place], Garfield P, DO   6 months ago Benign hypertensive renal disease   [facility] [place], Barb Merino, DO       Future Appointments             In 3 days Wynetta Emery, Barb Merino, DO [facility]

## 2021-01-26 ENCOUNTER — Ambulatory Visit: Payer: HMO | Admitting: Family Medicine

## 2021-01-27 ENCOUNTER — Encounter (INDEPENDENT_AMBULATORY_CARE_PROVIDER_SITE_OTHER): Payer: Self-pay

## 2021-01-27 ENCOUNTER — Ambulatory Visit (INDEPENDENT_AMBULATORY_CARE_PROVIDER_SITE_OTHER): Payer: HMO | Admitting: Family Medicine

## 2021-01-27 ENCOUNTER — Other Ambulatory Visit: Payer: Self-pay

## 2021-01-27 ENCOUNTER — Encounter: Payer: Self-pay | Admitting: Family Medicine

## 2021-01-27 VITALS — BP 118/77 | HR 85 | Temp 98.5°F | Wt 89.8 lb

## 2021-01-27 DIAGNOSIS — E1122 Type 2 diabetes mellitus with diabetic chronic kidney disease: Secondary | ICD-10-CM

## 2021-01-27 DIAGNOSIS — I129 Hypertensive chronic kidney disease with stage 1 through stage 4 chronic kidney disease, or unspecified chronic kidney disease: Secondary | ICD-10-CM | POA: Diagnosis not present

## 2021-01-27 DIAGNOSIS — F339 Major depressive disorder, recurrent, unspecified: Secondary | ICD-10-CM

## 2021-01-27 DIAGNOSIS — J449 Chronic obstructive pulmonary disease, unspecified: Secondary | ICD-10-CM

## 2021-01-27 DIAGNOSIS — E1169 Type 2 diabetes mellitus with other specified complication: Secondary | ICD-10-CM

## 2021-01-27 DIAGNOSIS — Z794 Long term (current) use of insulin: Secondary | ICD-10-CM | POA: Diagnosis not present

## 2021-01-27 DIAGNOSIS — E785 Hyperlipidemia, unspecified: Secondary | ICD-10-CM | POA: Diagnosis not present

## 2021-01-27 DIAGNOSIS — N183 Chronic kidney disease, stage 3 unspecified: Secondary | ICD-10-CM | POA: Diagnosis not present

## 2021-01-27 LAB — BAYER DCA HB A1C WAIVED: HB A1C (BAYER DCA - WAIVED): 7.9 % — ABNORMAL HIGH (ref 4.8–5.6)

## 2021-01-27 MED ORDER — SERTRALINE HCL 50 MG PO TABS: 75.0000 mg | ORAL_TABLET | Freq: Every day | ORAL | 1 refills | Status: DC

## 2021-01-27 MED ORDER — AMLODIPINE BESYLATE 5 MG PO TABS: 5.0000 mg | ORAL_TABLET | Freq: Every day | ORAL | 0 refills | Status: DC

## 2021-01-27 MED ORDER — ATORVASTATIN CALCIUM 40 MG PO TABS: ORAL_TABLET | ORAL | 0 refills | Status: DC

## 2021-01-27 MED ORDER — MONTELUKAST SODIUM 10 MG PO TABS: 10.0000 mg | ORAL_TABLET | Freq: Every day | ORAL | 0 refills | Status: DC

## 2021-01-27 NOTE — Assessment & Plan Note (Signed)
Stable.       - Continue to monitor

## 2021-01-27 NOTE — Assessment & Plan Note (Signed)
Under good control on current regimen. Continue current regimen. Continue to monitor. Call with any concerns. Refills given. Labs drawn today.   

## 2021-01-27 NOTE — Assessment & Plan Note (Signed)
Rechecking labs today. Await results. Treat as needed.  °

## 2021-01-27 NOTE — Progress Notes (Signed)
BP 118/77    Pulse 85    Temp 98.5 F (36.9 C) (Oral)    Wt 89 lb 12.8 oz (40.7 kg)    SpO2 97%    BMI 17.54 kg/m    Subjective:    Patient ID: Angela Neal, female    DOB: [birth year], 88 y.o.   MRN: [id number]  HPI: Angela Neal is a 88 y.o. female  Chief Complaint  Patient presents with   Depression   Diabetes    No recent eye exam per patients daughter   Hyperlipidemia   Hypertension   DIABETES Hypoglycemic episodes:no Polydipsia/polyuria: no Visual disturbance: no Chest pain: no Paresthesias: no Glucose Monitoring: no  Accucheck frequency: Not Checking Taking Insulin?: yes Blood Pressure Monitoring: not checking Retinal Examination: Up to Date Foot Exam: Up to Date Diabetic Education: Completed Pneumovax: Up to Date Influenza: Up to Date Aspirin: yes  HYPERTENSION / HYPERLIPIDEMIA Satisfied with current treatment? yes Duration of hypertension: chronic BP monitoring frequency: not checking BP medication side effects: no Past BP meds: losartan, amlodipine Duration of hyperlipidemia: chronic Cholesterol medication side effects: no Cholesterol supplements: none Past cholesterol medications: atorvastatin Medication compliance: excellent compliance Aspirin: yes Recent stressors: no Recurrent headaches: no Visual changes: no Palpitations: no Dyspnea: no Chest pain: no Lower extremity edema: no Dizzy/lightheaded: no  DEPRESSION Mood status: stable Satisfied with current treatment?: yes Symptom severity: mild  Duration of current treatment : chronic Side effects: no Medication compliance: good compliance Psychotherapy/counseling: no  Previous psychiatric medications: sertraline Depressed mood: yes Anxious mood: no Anhedonia: no Significant weight loss or gain: no Insomnia: no  Fatigue: yes Feelings of worthlessness or guilt: no Impaired concentration/indecisiveness: no Suicidal ideations: no Hopelessness: no Crying spells: no Depression  screen [facility] 2/9 01/27/2021 10/02/2020 07/02/2020  Decreased Interest 1 0 0  Down, Depressed, Hopeless 1 0 0  PHQ - 2 Score 2 0 0  Altered sleeping 1 0 -  Tired, decreased energy 1 0 -  Change in appetite 3 0 -  Feeling bad or failure about yourself  0 0 -  Trouble concentrating 3 0 -  Moving slowly or fidgety/restless 1 0 -  Suicidal thoughts 0 0 -  PHQ-9 Score 11 0 -  Difficult doing work/chores Somewhat difficult Not difficult at all -    Relevant past medical, surgical, family and social history reviewed and updated as indicated. Interim medical history since our last visit reviewed. Allergies and medications reviewed and updated.  Review of Systems  Constitutional: Negative.   Respiratory: Negative.    Cardiovascular: Negative.   Gastrointestinal: Negative.   Musculoskeletal: Negative.   Neurological: Negative.   Psychiatric/Behavioral: Negative.     Per HPI unless specifically indicated above     Objective:    BP 118/77    Pulse 85    Temp 98.5 F (36.9 C) (Oral)    Wt 89 lb 12.8 oz (40.7 kg)    SpO2 97%    BMI 17.54 kg/m   Wt Readings from Last 3 Encounters:  01/27/21 89 lb 12.8 oz (40.7 kg)  12/27/20 97 lb (44 kg)  12/27/20 97 lb (44 kg)    Physical Exam Vitals and nursing note reviewed.  Constitutional:      General: She is not in acute distress.    Appearance: Normal appearance. She is not ill-appearing, toxic-appearing or diaphoretic.  HENT:     Head: Normocephalic and atraumatic.     Right Ear: External ear normal.     Left  Ear: External ear normal.     Nose: Nose normal.     Mouth/Throat:     Mouth: Mucous membranes are moist.     Pharynx: Oropharynx is clear.  Eyes:     General: No scleral icterus.       Right eye: No discharge.        Left eye: No discharge.     Extraocular Movements: Extraocular movements intact.     Conjunctiva/sclera: Conjunctivae normal.     Pupils: Pupils are equal, round, and reactive to light.  Cardiovascular:     Rate and  Rhythm: Normal rate and regular rhythm.     Pulses: Normal pulses.     Heart sounds: Murmur heard.    No friction rub. No gallop.  Pulmonary:     Effort: Pulmonary effort is normal. No respiratory distress.     Breath sounds: Normal breath sounds. No stridor. No wheezing, rhonchi or rales.  Chest:     Chest wall: No tenderness.  Musculoskeletal:        General: Normal range of motion.     Cervical back: Normal range of motion and neck supple.  Skin:    General: Skin is warm and dry.     Capillary Refill: Capillary refill takes less than 2 seconds.     Coloration: Skin is not jaundiced or pale.     Findings: No bruising, erythema, lesion or rash.  Neurological:     General: No focal deficit present.     Mental Status: She is alert and oriented to person, place, and time. Mental status is at baseline.  Psychiatric:        Mood and Affect: Mood normal.        Behavior: Behavior normal.        Thought Content: Thought content normal.        Judgment: Judgment normal.    Results for orders placed or performed during the hospital encounter of 12/27/20  Resp Panel by RT-PCR (Flu A&B, Covid) Nasopharyngeal Swab   Specimen: Nasopharyngeal Swab; Nasopharyngeal(NP) swabs in vial transport medium  Result Value Ref Range   SARS Coronavirus 2 by RT PCR POSITIVE (A) NEGATIVE   Influenza A by PCR NEGATIVE NEGATIVE   Influenza B by PCR NEGATIVE NEGATIVE  Comprehensive metabolic panel  Result Value Ref Range   Sodium 136 135 - 145 mmol/L   Potassium 3.7 3.5 - 5.1 mmol/L   Chloride 103 98 - 111 mmol/L   CO2 21 (L) 22 - 32 mmol/L   Glucose, Bld 275 (H) 70 - 99 mg/dL   BUN 22 8 - 23 mg/dL   Creatinine, Ser 1.18 (H) 0.44 - 1.00 mg/dL   Calcium 9.0 8.9 - 10.3 mg/dL   Total Protein 7.4 6.5 - 8.1 g/dL   Albumin 3.9 3.5 - 5.0 g/dL   AST 25 15 - 41 U/L   ALT 40 0 - 44 U/L   Alkaline Phosphatase 84 38 - 126 U/L   Total Bilirubin 1.2 0.3 - 1.2 mg/dL   GFR, Estimated 42 (L) >60 mL/min   Anion  gap 12 5 - 15  CBC with Differential  Result Value Ref Range   WBC 9.7 4.0 - 10.5 K/uL   RBC 3.98 3.87 - 5.11 MIL/uL   Hemoglobin 12.1 12.0 - 15.0 g/dL   HCT 37.1 36.0 - 46.0 %   MCV 93.2 80.0 - 100.0 fL   MCH 30.4 26.0 - 34.0 pg   MCHC 32.6 30.0 - 36.0  g/dL   RDW 13.9 11.5 - 15.5 %   Platelets 121 (L) 150 - 400 K/uL   nRBC 0.0 0.0 - 0.2 %   Neutrophils Relative % 88 %   Neutro Abs 8.4 (H) 1.7 - 7.7 K/uL   Lymphocytes Relative 3 %   Lymphs Abs 0.3 (L) 0.7 - 4.0 K/uL   Monocytes Relative 9 %   Monocytes Absolute 0.8 0.1 - 1.0 K/uL   Eosinophils Relative 0 %   Eosinophils Absolute 0.0 0.0 - 0.5 K/uL   Basophils Relative 0 %   Basophils Absolute 0.0 0.0 - 0.1 K/uL   Immature Granulocytes 0 %   Abs Immature Granulocytes 0.02 0.00 - 0.07 K/uL      Assessment & Plan:   Problem List Items Addressed This Visit       Respiratory   COPD (chronic obstructive pulmonary disease) ([place])    Stable. Continue to monitor.        Relevant Medications   montelukast (SINGULAIR) 10 MG tablet     Endocrine   Type 2 diabetes mellitus with renal complication (HCC) - Primary    Up slightly with A1c of 7.9 but was on steroids for COVID. Will forward labs to endocrinology. Continue current regimen. Continue to monitor.       Relevant Medications   atorvastatin (LIPITOR) 40 MG tablet   Other Relevant Orders   Comprehensive metabolic panel   CBC with Differential/Platelet   Bayer DCA Hb A1c Waived   Hyperlipidemia associated with type 2 diabetes mellitus ([place])    Under good control on current regimen. Continue current regimen. Continue to monitor. Call with any concerns. Refills given. Labs drawn today.       Relevant Medications   atorvastatin (LIPITOR) 40 MG tablet   amLODipine (NORVASC) 5 MG tablet   Other Relevant Orders   Comprehensive metabolic panel   CBC with Differential/Platelet   Lipid Panel w/o Chol/HDL Ratio     Genitourinary   Benign hypertensive renal disease     Under good control on current regimen. Continue current regimen. Continue to monitor. Call with any concerns. Refills given. Labs drawn today.       Relevant Orders   Comprehensive metabolic panel   CBC with Differential/Platelet   Stage 3 chronic kidney disease ([place])    Rechecking labs today. Await results. Treat as needed.         Other   Depression, recurrent ([place])    Under good control on current regimen. Continue current regimen. Continue to monitor. Call with any concerns. Refills given.        Relevant Medications   sertraline (ZOLOFT) 50 MG tablet     Follow up plan: Return in about 4 months (around 05/27/2021).

## 2021-01-27 NOTE — Assessment & Plan Note (Signed)
Up slightly with A1c of 7.9 but was on steroids for COVID. Will forward labs to endocrinology. Continue current regimen. Continue to monitor.

## 2021-01-27 NOTE — Assessment & Plan Note (Signed)
Under good control on current regimen. Continue current regimen. Continue to monitor. Call with any concerns. Refills given.   

## 2021-01-28 LAB — CBC WITH DIFFERENTIAL/PLATELET
Basophils Absolute: 0.1 10*3/uL (ref 0.0–0.2)
Basos: 1 %
EOS (ABSOLUTE): 0.3 10*3/uL (ref 0.0–0.4)
Eos: 5 %
Hematocrit: 39 % (ref 34.0–46.6)
Hemoglobin: 13.1 g/dL (ref 11.1–15.9)
Immature Grans (Abs): 0 10*3/uL (ref 0.0–0.1)
Immature Granulocytes: 0 %
Lymphocytes Absolute: 1.1 10*3/uL (ref 0.7–3.1)
Lymphs: 16 %
MCH: 30.5 pg (ref 26.6–33.0)
MCHC: 33.6 g/dL (ref 31.5–35.7)
MCV: 91 fL (ref 79–97)
Monocytes Absolute: 0.5 10*3/uL (ref 0.1–0.9)
Monocytes: 8 %
Neutrophils Absolute: 4.5 10*3/uL (ref 1.4–7.0)
Neutrophils: 70 %
Platelets: 148 10*3/uL — ABNORMAL LOW (ref 150–450)
RBC: 4.29 x10E6/uL (ref 3.77–5.28)
RDW: 13.2 % (ref 11.7–15.4)
WBC: 6.5 10*3/uL (ref 3.4–10.8)

## 2021-01-28 LAB — COMPREHENSIVE METABOLIC PANEL
ALT: 27 IU/L (ref 0–32)
AST: 22 IU/L (ref 0–40)
Albumin/Globulin Ratio: 1.5 (ref 1.2–2.2)
Albumin: 4 g/dL (ref 3.5–4.6)
Alkaline Phosphatase: 97 IU/L (ref 44–121)
BUN/Creatinine Ratio: 21 (ref 12–28)
BUN: 21 mg/dL (ref 10–36)
Bilirubin Total: 0.4 mg/dL (ref 0.0–1.2)
CO2: 21 mmol/L (ref 20–29)
Calcium: 8.9 mg/dL (ref 8.7–10.3)
Chloride: 107 mmol/L — ABNORMAL HIGH (ref 96–106)
Creatinine, Ser: 0.99 mg/dL (ref 0.57–1.00)
Globulin, Total: 2.7 g/dL (ref 1.5–4.5)
Glucose: 263 mg/dL — ABNORMAL HIGH (ref 70–99)
Potassium: 4 mmol/L (ref 3.5–5.2)
Sodium: 143 mmol/L (ref 134–144)
Total Protein: 6.7 g/dL (ref 6.0–8.5)
eGFR: 52 mL/min/{1.73_m2} — ABNORMAL LOW (ref >59)

## 2021-01-28 LAB — LIPID PANEL W/O CHOL/HDL RATIO
Cholesterol, Total: 148 mg/dL (ref 100–199)
HDL: 56 mg/dL (ref >39)
LDL Chol Calc (NIH): 71 mg/dL (ref 0–99)
Triglycerides: 116 mg/dL (ref 0–149)
VLDL Cholesterol Cal: 21 mg/dL (ref 5–40)

## 2021-02-02 ENCOUNTER — Encounter: Payer: Self-pay | Admitting: Family Medicine

## 2021-02-02 DIAGNOSIS — H903 Sensorineural hearing loss, bilateral: Secondary | ICD-10-CM | POA: Diagnosis not present

## 2021-02-02 DIAGNOSIS — H6123 Impacted cerumen, bilateral: Secondary | ICD-10-CM | POA: Diagnosis not present

## 2021-02-10 DIAGNOSIS — E1122 Type 2 diabetes mellitus with diabetic chronic kidney disease: Secondary | ICD-10-CM | POA: Diagnosis not present

## 2021-03-10 DIAGNOSIS — N1831 Chronic kidney disease, stage 3a: Secondary | ICD-10-CM | POA: Diagnosis not present

## 2021-03-10 DIAGNOSIS — E1122 Type 2 diabetes mellitus with diabetic chronic kidney disease: Secondary | ICD-10-CM | POA: Diagnosis not present

## 2021-03-10 DIAGNOSIS — E113313 Type 2 diabetes mellitus with moderate nonproliferative diabetic retinopathy with macular edema, bilateral: Secondary | ICD-10-CM | POA: Diagnosis not present

## 2021-03-10 DIAGNOSIS — M810 Age-related osteoporosis without current pathological fracture: Secondary | ICD-10-CM | POA: Diagnosis not present

## 2021-03-10 DIAGNOSIS — R809 Proteinuria, unspecified: Secondary | ICD-10-CM | POA: Diagnosis not present

## 2021-03-10 DIAGNOSIS — E1129 Type 2 diabetes mellitus with other diabetic kidney complication: Secondary | ICD-10-CM | POA: Diagnosis not present

## 2021-03-10 DIAGNOSIS — Z794 Long term (current) use of insulin: Secondary | ICD-10-CM | POA: Diagnosis not present

## 2021-04-09 ENCOUNTER — Other Ambulatory Visit: Payer: Self-pay | Admitting: Family Medicine

## 2021-04-10 DIAGNOSIS — E1122 Type 2 diabetes mellitus with diabetic chronic kidney disease: Secondary | ICD-10-CM | POA: Diagnosis not present

## 2021-04-10 NOTE — Telephone Encounter (Signed)
Requested medication (s) are due for refill today - not on list ? ?Requested medication (s) are on the active medication list - no ? ?Future visit scheduled -yes ? ?Last refill: not on current medication list ? ?Notes to clinic: Request for diabetic testing supplies- will require new rx ? ?Requested Prescriptions  ?Pending Prescriptions Disp Refills  ? TRUE METRIX BLOOD GLUCOSE TEST test strip [Pharmacy Med Name: TRUE METRIX BLOOD GLUCOSE TEST STRI] 100 each   ?  Sig: TEST BLOOD SUGAR 4 TIMES DAILY BEFORE LEVEMIR IS GIVEN OR AS DIRECTED BY PHYSICIAN  ?  ? Endocrinology: Diabetes - Testing Supplies Passed - 04/09/2021  3:27 PM  ?  ?  Passed - Valid encounter within last 12 months  ?  Recent Outpatient Visits   ? ?      ? 2 months ago Type 2 diabetes mellitus with stage 3 chronic kidney disease, with long-term current use of insulin, unspecified whether stage 3a or 3b CKD ([place])  ? [place], DO  ? 6 months ago Need for immunization against influenza  ? [place], Megan P, DO  ? 9 months ago Benign hypertensive renal disease  ? [place], [place] P, DO  ? ?  ?  ?Future Appointments   ? ?        ? In 1 month Johnson, Barb Merino, DO [facility]  ? ?  ? ?  ?  ?  ? ? ? ?Requested Prescriptions  ?Pending Prescriptions Disp Refills  ? TRUE METRIX BLOOD GLUCOSE TEST test strip [Pharmacy Med Name: TRUE METRIX BLOOD GLUCOSE TEST STRI] 100 each   ?  Sig: TEST BLOOD SUGAR 4 TIMES DAILY BEFORE LEVEMIR IS GIVEN OR AS DIRECTED BY PHYSICIAN  ?  ? Endocrinology: Diabetes - Testing Supplies Passed - 04/09/2021  3:27 PM  ?  ?  Passed - Valid encounter within last 12 months  ?  Recent Outpatient Visits   ? ?      ? 2 months ago Type 2 diabetes mellitus with stage 3 chronic kidney disease, with long-term current use of insulin, unspecified whether stage 3a or 3b CKD ([place])  ? [place], DO  ? 6 months ago Need for  immunization against influenza  ? [place], Megan P, DO  ? 9 months ago Benign hypertensive renal disease  ? [place], [place] P, DO  ? ?  ?  ?Future Appointments   ? ?        ? In 1 month Johnson, Barb Merino, DO [facility]  ? ?  ? ?  ?  ?  ? ? ? ?

## 2021-05-10 DIAGNOSIS — E1122 Type 2 diabetes mellitus with diabetic chronic kidney disease: Secondary | ICD-10-CM | POA: Diagnosis not present

## 2021-05-22 ENCOUNTER — Other Ambulatory Visit: Payer: Self-pay | Admitting: Family Medicine

## 2021-05-25 NOTE — Telephone Encounter (Signed)
Requested medication (s) are due for refill today: Yes  Requested medication (s) are on the active medication list: Yes  Last refill:  07/02/20  Future visit scheduled: Yes  Notes to clinic:  No protocol. Pharmacy asking for replacement.    Requested Prescriptions  Pending Prescriptions Disp Refills   Multiple Vitamins-Minerals (GNP HEALTHY EYES SUPERVISION 2) CAPS [Pharmacy Med Name: GNP HEALTHY EYES SUPERVISION 2 CAP] 180 capsule 3    Sig: TAKE 1 CAPSULE BY MOUTH 2 TIMES DAILY     There is no refill protocol information for this order

## 2021-05-28 ENCOUNTER — Ambulatory Visit: Payer: HMO | Admitting: Family Medicine

## 2021-06-04 ENCOUNTER — Encounter: Payer: Self-pay | Admitting: Family Medicine

## 2021-06-04 ENCOUNTER — Ambulatory Visit (INDEPENDENT_AMBULATORY_CARE_PROVIDER_SITE_OTHER): Payer: HMO | Admitting: Family Medicine

## 2021-06-04 VITALS — BP 144/81 | HR 75 | Temp 98.0°F | Wt 91.0 lb

## 2021-06-04 DIAGNOSIS — J449 Chronic obstructive pulmonary disease, unspecified: Secondary | ICD-10-CM | POA: Diagnosis not present

## 2021-06-04 DIAGNOSIS — E1169 Type 2 diabetes mellitus with other specified complication: Secondary | ICD-10-CM | POA: Diagnosis not present

## 2021-06-04 DIAGNOSIS — E1122 Type 2 diabetes mellitus with diabetic chronic kidney disease: Secondary | ICD-10-CM | POA: Diagnosis not present

## 2021-06-04 DIAGNOSIS — E785 Hyperlipidemia, unspecified: Secondary | ICD-10-CM | POA: Diagnosis not present

## 2021-06-04 DIAGNOSIS — I129 Hypertensive chronic kidney disease with stage 1 through stage 4 chronic kidney disease, or unspecified chronic kidney disease: Secondary | ICD-10-CM

## 2021-06-04 DIAGNOSIS — F339 Major depressive disorder, recurrent, unspecified: Secondary | ICD-10-CM | POA: Diagnosis not present

## 2021-06-04 DIAGNOSIS — N183 Chronic kidney disease, stage 3 unspecified: Secondary | ICD-10-CM | POA: Diagnosis not present

## 2021-06-04 DIAGNOSIS — Z794 Long term (current) use of insulin: Secondary | ICD-10-CM

## 2021-06-04 LAB — BAYER DCA HB A1C WAIVED: HB A1C (BAYER DCA - WAIVED): 6.5 % — ABNORMAL HIGH (ref 4.8–5.6)

## 2021-06-04 MED ORDER — AMLODIPINE BESYLATE 5 MG PO TABS: 5.0000 mg | ORAL_TABLET | Freq: Every day | ORAL | 1 refills | Status: DC

## 2021-06-04 MED ORDER — ATORVASTATIN CALCIUM 40 MG PO TABS: ORAL_TABLET | ORAL | 1 refills | Status: DC

## 2021-06-04 MED ORDER — SERTRALINE HCL 50 MG PO TABS: 75.0000 mg | ORAL_TABLET | Freq: Every day | ORAL | 1 refills | Status: DC

## 2021-06-04 MED ORDER — NOVOLOG FLEXPEN 100 UNIT/ML ~~LOC~~ SOPN: 4.0000 [IU] | PEN_INJECTOR | Freq: Three times a day (TID) | SUBCUTANEOUS | 3 refills | Status: DC

## 2021-06-04 MED ORDER — MONTELUKAST SODIUM 10 MG PO TABS: 10.0000 mg | ORAL_TABLET | Freq: Every day | ORAL | 1 refills | Status: DC

## 2021-06-04 MED ORDER — LOSARTAN POTASSIUM 100 MG PO TABS
100.0000 mg | ORAL_TABLET | Freq: Every day | ORAL | 1 refills | Status: DC
Start: 2021-06-04 — End: 2021-10-05

## 2021-06-04 MED ORDER — JANUVIA 100 MG PO TABS: 100.0000 mg | ORAL_TABLET | Freq: Every day | ORAL | 1 refills | Status: DC

## 2021-06-04 MED ORDER — LEVEMIR FLEXTOUCH 100 UNIT/ML ~~LOC~~ SOPN: 7.0000 [IU] | PEN_INJECTOR | Freq: Every day | SUBCUTANEOUS | 3 refills | Status: DC

## 2021-06-04 NOTE — Assessment & Plan Note (Signed)
Under good control on current regimen. Continue current regimen. Continue to monitor. Call with any concerns. Refills given. Labs drawn today.   

## 2021-06-04 NOTE — Assessment & Plan Note (Signed)
Under good control on current regimen with a1c of 6.5. Continue current regimen. Continue to monitor. Call with any concerns. Refills given. Labs drawn today.

## 2021-06-04 NOTE — Progress Notes (Signed)
BP (!) 144/81   Pulse 75   Temp 98 F (36.7 C)   Wt 91 lb (41.3 kg)   SpO2 98%   BMI 17.77 kg/m    Subjective:    Patient ID: Angela Neal, female    DOB: 1921-09-20, 88 y.o.   MRN: [id number]  HPI: Angela Neal is a 88 y.o. female  Chief Complaint  Patient presents with   Diabetes   COPD   Hyperlipidemia   Hypertension   Depression   DIABETES Hypoglycemic episodes:no Polydipsia/polyuria: no Visual disturbance: no Chest pain: no Paresthesias: no Glucose Monitoring: yes  Accucheck frequency: TID Taking Insulin?: yes  Long acting insulin: Yes  Short acting insulin: Yes Blood Pressure Monitoring: a few times a week Retinal Examination: Not up to Date Foot Exam: Up to Date Diabetic Education: Completed Pneumovax: Up to Date Influenza: Up to Date Aspirin: no  HYPERTENSION / [place] Satisfied with current treatment? yes Duration of hypertension: chronic BP monitoring frequency: not checking BP medication side effects: no Past BP meds: amlodipine, losartan Duration of hyperlipidemia: chronic Cholesterol medication side effects: no Cholesterol supplements: none Past cholesterol medications: atorvastatin Medication compliance: excellent compliance Aspirin: yes Recent stressors: no Recurrent headaches: no Visual changes: no Palpitations: no Dyspnea: no Chest pain: no Lower extremity edema: no Dizzy/lightheaded: no  DEPRESSION Mood status: controlled Satisfied with current treatment?: yes Symptom severity: mild  Duration of current treatment : chronic Side effects: no Medication compliance: excellent compliance Psychotherapy/counseling: no  Previous psychiatric medications: sertraline Depressed mood: no Anxious mood: no Anhedonia: no Significant weight loss or gain: no Insomnia: no  Fatigue: no Feelings of worthlessness or guilt: no Impaired concentration/indecisiveness: no Suicidal ideations: no Hopelessness: no Crying spells:  no    06/04/2021    2:12 PM 01/27/2021    9:51 AM 10/02/2020    1:36 PM 07/02/2020   10:58 AM  Depression screen PHQ 2/9  Decreased Interest 0 1 0 0  Down, Depressed, Hopeless 0 1 0 0  PHQ - 2 Score 0 2 0 0  Altered sleeping 0 1 0   Tired, decreased energy 0 1 0   Change in appetite 0 3 0   Feeling bad or failure about yourself  0 0 0   Trouble concentrating 0 3 0   Moving slowly or fidgety/restless 0 1 0   Suicidal thoughts 0 0 0   PHQ-9 Score 0 11 0   Difficult doing work/chores  Somewhat difficult Not difficult at all     Relevant past medical, surgical, family and social history reviewed and updated as indicated. Interim medical history since our last visit reviewed. Allergies and medications reviewed and updated.  Review of Systems  Constitutional: Negative.   Respiratory: Negative.    Cardiovascular: Negative.   Gastrointestinal: Negative.   Musculoskeletal: Negative.   Neurological: Negative.   Psychiatric/Behavioral: Negative.     Per HPI unless specifically indicated above     Objective:    BP (!) 144/81   Pulse 75   Temp 98 F (36.7 C)   Wt 91 lb (41.3 kg)   SpO2 98%   BMI 17.77 kg/m   Wt Readings from Last 3 Encounters:  06/04/21 91 lb (41.3 kg)  01/27/21 89 lb 12.8 oz (40.7 kg)  12/27/20 97 lb (44 kg)    Physical Exam Vitals and nursing note reviewed.  Constitutional:      General: She is not in acute distress.    Appearance: Normal appearance. She is not  ill-appearing, toxic-appearing or diaphoretic.  HENT:     Head: Normocephalic and atraumatic.     Right Ear: External ear normal.     Left Ear: External ear normal.     Nose: Nose normal.     Mouth/Throat:     Mouth: Mucous membranes are moist.     Pharynx: Oropharynx is clear.  Eyes:     General: No scleral icterus.       Right eye: No discharge.        Left eye: No discharge.     Extraocular Movements: Extraocular movements intact.     Conjunctiva/sclera: Conjunctivae normal.      Pupils: Pupils are equal, round, and reactive to light.  Cardiovascular:     Rate and Rhythm: Normal rate and regular rhythm.     Pulses: Normal pulses.     Heart sounds: Murmur heard.    No friction rub. No gallop.  Pulmonary:     Effort: Pulmonary effort is normal. No respiratory distress.     Breath sounds: Normal breath sounds. No stridor. No wheezing, rhonchi or rales.  Chest:     Chest wall: No tenderness.  Musculoskeletal:        General: Normal range of motion.     Cervical back: Normal range of motion and neck supple.  Skin:    General: Skin is warm and dry.     Capillary Refill: Capillary refill takes less than 2 seconds.     Coloration: Skin is not jaundiced or pale.     Findings: No bruising, erythema, lesion or rash.  Neurological:     General: No focal deficit present.     Mental Status: She is alert and oriented to person, place, and time. Mental status is at baseline.  Psychiatric:        Mood and Affect: Mood normal.        Behavior: Behavior normal.        Thought Content: Thought content normal.        Judgment: Judgment normal.    Results for orders placed or performed in visit on 06/04/21  Bayer DCA Hb A1c Waived  Result Value Ref Range   HB A1C (BAYER DCA - WAIVED) 6.5 (H) 4.8 - 5.6 %      Assessment & Plan:   Problem List Items Addressed This Visit       Respiratory   COPD (chronic obstructive pulmonary disease) ([place])    Under good control on current regimen. Continue current regimen. Continue to monitor. Call with any concerns. Refills given. Labs drawn today.         Relevant Medications   montelukast (SINGULAIR) 10 MG tablet   Other Relevant Orders   CBC with Differential/Platelet   Comprehensive metabolic panel     Endocrine   Type 2 diabetes mellitus with renal complication ([place]) - Primary    Under good control on current regimen with a1c of 6.5. Continue current regimen. Continue to monitor. Call with any concerns. Refills given.  Labs drawn today.         Relevant Medications   losartan (COZAAR) 100 MG tablet   LEVEMIR FLEXTOUCH 100 UNIT/ML FlexTouch Pen   JANUVIA 100 MG tablet   atorvastatin (LIPITOR) 40 MG tablet   NOVOLOG FLEXPEN 100 UNIT/ML FlexPen   Other Relevant Orders   CBC with Differential/Platelet   Bayer DCA Hb A1c Waived (Completed)   Comprehensive metabolic panel   Hyperlipidemia associated with type 2 diabetes mellitus ([place])  Under good control on current regimen. Continue current regimen. Continue to monitor. Call with any concerns. Refills given. Labs drawn today.         Relevant Medications   losartan (COZAAR) 100 MG tablet   LEVEMIR FLEXTOUCH 100 UNIT/ML FlexTouch Pen   JANUVIA 100 MG tablet   atorvastatin (LIPITOR) 40 MG tablet   amLODipine (NORVASC) 5 MG tablet   NOVOLOG FLEXPEN 100 UNIT/ML FlexPen   Other Relevant Orders   CBC with Differential/Platelet   Comprehensive metabolic panel   Lipid Panel w/o Chol/HDL Ratio     Genitourinary   Benign hypertensive renal disease    Under good control on current regimen. Continue current regimen. Continue to monitor. Call with any concerns. Refills given. Labs drawn today.         Relevant Orders   CBC with Differential/Platelet   Comprehensive metabolic panel     Other   Depression, recurrent ([place])    Under good control on current regimen. Continue current regimen. Continue to monitor. Call with any concerns. Refills given. Labs drawn today.         Relevant Medications   sertraline (ZOLOFT) 50 MG tablet     Follow up plan: Return in about 4 months (around 10/04/2021).

## 2021-06-05 LAB — COMPREHENSIVE METABOLIC PANEL
ALT: 31 IU/L (ref 0–32)
AST: 29 IU/L (ref 0–40)
Albumin/Globulin Ratio: 1.7 (ref 1.2–2.2)
Albumin: 4.5 g/dL (ref 3.5–4.6)
Alkaline Phosphatase: 104 IU/L (ref 44–121)
BUN/Creatinine Ratio: 22 (ref 12–28)
BUN: 22 mg/dL (ref 10–36)
Bilirubin Total: 0.4 mg/dL (ref 0.0–1.2)
CO2: 23 mmol/L (ref 20–29)
Calcium: 9.6 mg/dL (ref 8.7–10.3)
Chloride: 107 mmol/L — ABNORMAL HIGH (ref 96–106)
Creatinine, Ser: 0.98 mg/dL (ref 0.57–1.00)
Globulin, Total: 2.7 g/dL (ref 1.5–4.5)
Glucose: 88 mg/dL (ref 70–99)
Potassium: 3.6 mmol/L (ref 3.5–5.2)
Sodium: 147 mmol/L — ABNORMAL HIGH (ref 134–144)
Total Protein: 7.2 g/dL (ref 6.0–8.5)
eGFR: 52 mL/min/{1.73_m2} — ABNORMAL LOW (ref >59)

## 2021-06-05 LAB — CBC WITH DIFFERENTIAL/PLATELET
Basophils Absolute: 0.1 10*3/uL (ref 0.0–0.2)
Basos: 1 %
EOS (ABSOLUTE): 0.6 10*3/uL — ABNORMAL HIGH (ref 0.0–0.4)
Eos: 7 %
Hematocrit: 43.8 % (ref 34.0–46.6)
Hemoglobin: 14.5 g/dL (ref 11.1–15.9)
Immature Grans (Abs): 0 10*3/uL (ref 0.0–0.1)
Immature Granulocytes: 0 %
Lymphocytes Absolute: 1.5 10*3/uL (ref 0.7–3.1)
Lymphs: 17 %
MCH: 30.2 pg (ref 26.6–33.0)
MCHC: 33.1 g/dL (ref 31.5–35.7)
MCV: 91 fL (ref 79–97)
Monocytes Absolute: 0.9 10*3/uL (ref 0.1–0.9)
Monocytes: 10 %
Neutrophils Absolute: 5.8 10*3/uL (ref 1.4–7.0)
Neutrophils: 65 %
Platelets: 172 10*3/uL (ref 150–450)
RBC: 4.8 x10E6/uL (ref 3.77–5.28)
RDW: 13.7 % (ref 11.7–15.4)
WBC: 9 10*3/uL (ref 3.4–10.8)

## 2021-06-05 LAB — LIPID PANEL W/O CHOL/HDL RATIO
Cholesterol, Total: 151 mg/dL (ref 100–199)
HDL: 60 mg/dL (ref >39)
LDL Chol Calc (NIH): 72 mg/dL (ref 0–99)
Triglycerides: 105 mg/dL (ref 0–149)
VLDL Cholesterol Cal: 19 mg/dL (ref 5–40)

## 2021-06-07 ENCOUNTER — Encounter: Payer: Self-pay | Admitting: Family Medicine

## 2021-06-10 DIAGNOSIS — E1122 Type 2 diabetes mellitus with diabetic chronic kidney disease: Secondary | ICD-10-CM | POA: Diagnosis not present

## 2021-06-19 ENCOUNTER — Other Ambulatory Visit: Payer: Self-pay | Admitting: Family Medicine

## 2021-06-19 NOTE — Telephone Encounter (Signed)
Requested medication (s) are due for refill today: no  Requested medication (s) are on the active medication list: no  Last refill:  na  Future visit scheduled: yes in 3 months  Notes to clinic:  To pharmacy: [facility]? INSURANCE WOULD COVER WITH A VALID RX FROM A PROVIDER. Lula YOU     Requested Prescriptions  Pending Prescriptions Disp Refills   TRUEplus Safety Lancets 28G [place] [Pharmacy Med Name: TRUEPLUS MINI SAFETY LANCETS 28G] 100 each     Sig: USE FOR FINGER STICK BLOOD SUGARS (CURRENTLY 4 TIMES DAILY)     Endocrinology: Diabetes - Testing Supplies Passed - 06/19/2021  9:16 AM      Passed - Valid encounter within last 12 months    Recent Outpatient Visits           2 weeks ago Type 2 diabetes mellitus with stage 3 chronic kidney disease, with long-term current use of insulin, unspecified whether stage 3a or 3b CKD ([place])   [facility] Johnson, Megan P, DO   4 months ago Type 2 diabetes mellitus with stage 3 chronic kidney disease, with long-term current use of insulin, unspecified whether stage 3a or 3b CKD ([place])   [place], Megan P, DO   8 months ago Need for immunization against influenza   [place], [place], DO   11 months ago Benign hypertensive renal disease   [facility] [place], Barb Merino, DO       Future Appointments             In 3 months Johnson, Barb Merino, DO [facility]

## 2021-06-24 ENCOUNTER — Other Ambulatory Visit: Payer: Self-pay | Admitting: Family Medicine

## 2021-06-24 NOTE — Telephone Encounter (Signed)
Requested medication (s) are due for refill today: yes  Requested medication (s) are on the active medication list: yes  Last refill:  01/09/19  Future visit scheduled: yes  Notes to clinic:  cannot delegate and historical provider.      Requested Prescriptions  Pending Prescriptions Disp Refills   D3-1000 25 MCG (1000 UT) tablet [Pharmacy Med Name: D3-1000 25 MCG (1000 UT) TAB] 90 tablet     Sig: TAKE 1 TABLET BY MOUTH ONCE EVERY DAY FOR SUPPLEMENT     Endocrinology:  Vitamins - Vitamin D Supplementation 2 Failed - 06/24/2021 10:15 AM      Failed - Manual Review: Route requests for 50,000 IU strength to the provider      Failed - Vitamin D in normal range and within 360 days    No results found for: "WH6759FM3", "WG6659DJ5", "TS177LT9QZE", "25OHVITD3", "25OHVITD2", "09QZRAQT6", "VD25OH"       Passed - Ca in normal range and within 360 days    Calcium  Date Value Ref Range Status  06/04/2021 9.6 8.7 - 10.3 mg/dL Final   Calcium, Total  Date Value Ref Range Status  04/26/2014 11.4 (H) mg/dL Final    Comment:    8.9-10.3 NOTE: New Reference Range  03/12/14          Passed - Valid encounter within last 12 months    Recent Outpatient Visits           2 weeks ago Type 2 diabetes mellitus with stage 3 chronic kidney disease, with long-term current use of insulin, unspecified whether stage 3a or 3b CKD ([place])   [place], Megan P, DO   4 months ago Type 2 diabetes mellitus with stage 3 chronic kidney disease, with long-term current use of insulin, unspecified whether stage 3a or 3b CKD ([place])   [place], Megan P, DO   8 months ago Need for immunization against influenza   [place], [place], DO   11 months ago Benign hypertensive renal disease   [facility] [place], Barb Merino, DO       Future Appointments             In 3 months Johnson, Barb Merino, DO [facility]

## 2021-07-10 DIAGNOSIS — E1122 Type 2 diabetes mellitus with diabetic chronic kidney disease: Secondary | ICD-10-CM | POA: Diagnosis not present

## 2021-07-13 DIAGNOSIS — E1122 Type 2 diabetes mellitus with diabetic chronic kidney disease: Secondary | ICD-10-CM | POA: Diagnosis not present

## 2021-07-13 DIAGNOSIS — E1129 Type 2 diabetes mellitus with other diabetic kidney complication: Secondary | ICD-10-CM | POA: Diagnosis not present

## 2021-07-13 DIAGNOSIS — R809 Proteinuria, unspecified: Secondary | ICD-10-CM | POA: Diagnosis not present

## 2021-07-13 DIAGNOSIS — Z794 Long term (current) use of insulin: Secondary | ICD-10-CM | POA: Diagnosis not present

## 2021-07-13 DIAGNOSIS — E113313 Type 2 diabetes mellitus with moderate nonproliferative diabetic retinopathy with macular edema, bilateral: Secondary | ICD-10-CM | POA: Diagnosis not present

## 2021-07-13 DIAGNOSIS — N1831 Chronic kidney disease, stage 3a: Secondary | ICD-10-CM | POA: Diagnosis not present

## 2021-07-24 ENCOUNTER — Other Ambulatory Visit: Payer: Self-pay | Admitting: Family Medicine

## 2021-07-24 NOTE — Telephone Encounter (Signed)
Filled 31 days ago for 90 day supply with refill remaining. Requested Prescriptions  Pending Prescriptions Disp Refills  . amLODipine (NORVASC) 5 MG tablet [Pharmacy Med Name: AMLODIPINE BESYLATE 5 MG TAB] 90 tablet 1    Sig: TAKE 1 TABLET BY MOUTH DAILY     Cardiovascular: Calcium Channel Blockers 2 Failed - 07/24/2021 11:28 AM      Failed - Last BP in normal range    BP Readings from Last 1 Encounters:  06/04/21 (!) 144/81         Passed - Last Heart Rate in normal range    Pulse Readings from Last 1 Encounters:  06/04/21 75         Passed - Valid encounter within last 6 months    Recent Outpatient Visits          1 month ago Type 2 diabetes mellitus with stage 3 chronic kidney disease, with long-term current use of insulin, unspecified whether stage 3a or 3b CKD ([place])   [place], Megan P, DO   5 months ago Type 2 diabetes mellitus with stage 3 chronic kidney disease, with long-term current use of insulin, unspecified whether stage 3a or 3b CKD ([place])   [place], Megan P, DO   9 months ago Need for immunization against influenza   [place], [place], DO   1 year ago Benign hypertensive renal disease   [facility] [place], Barb Merino, DO      Future Appointments            In 2 months Johnson, Barb Merino, DO [facility]           . atorvastatin (LIPITOR) 40 MG tablet [Pharmacy Med Name: ATORVASTATIN CALCIUM 40 MG TAB] 90 tablet 1    Sig: TAKE 1 TABLET BY MOUTH ONCE EVERY DAY     Cardiovascular:  Antilipid - Statins Failed - 07/24/2021 11:28 AM      Failed - Lipid Panel in normal range within the last 12 months    Cholesterol, Total  Date Value Ref Range Status  06/04/2021 151 100 - 199 mg/dL Final   LDL Chol Calc (NIH)  Date Value Ref Range Status  06/04/2021 72 0 - 99 mg/dL Final   HDL  Date Value Ref Range Status  06/04/2021 60 >39 mg/dL Final   Triglycerides  Date  Value Ref Range Status  06/04/2021 105 0 - 149 mg/dL Final         Passed - Patient is not pregnant      Passed - Valid encounter within last 12 months    Recent Outpatient Visits          1 month ago Type 2 diabetes mellitus with stage 3 chronic kidney disease, with long-term current use of insulin, unspecified whether stage 3a or 3b CKD ([place])   [place], Megan P, DO   5 months ago Type 2 diabetes mellitus with stage 3 chronic kidney disease, with long-term current use of insulin, unspecified whether stage 3a or 3b CKD ([place])   [place], Megan P, DO   9 months ago Need for immunization against influenza   [place], [place], DO   1 year ago Benign hypertensive renal disease   [facility] Johnson, Megan P, DO      Future Appointments            In 2 months  Johnson, Megan P, DO [facility]

## 2021-08-10 DIAGNOSIS — E1122 Type 2 diabetes mellitus with diabetic chronic kidney disease: Secondary | ICD-10-CM | POA: Diagnosis not present

## 2021-09-10 DIAGNOSIS — E1122 Type 2 diabetes mellitus with diabetic chronic kidney disease: Secondary | ICD-10-CM | POA: Diagnosis not present

## 2021-10-05 ENCOUNTER — Ambulatory Visit (INDEPENDENT_AMBULATORY_CARE_PROVIDER_SITE_OTHER): Payer: HMO | Admitting: Family Medicine

## 2021-10-05 ENCOUNTER — Encounter: Payer: Self-pay | Admitting: Family Medicine

## 2021-10-05 VITALS — BP 132/82 | HR 71 | Wt 94.4 lb

## 2021-10-05 DIAGNOSIS — Z794 Long term (current) use of insulin: Secondary | ICD-10-CM | POA: Diagnosis not present

## 2021-10-05 DIAGNOSIS — E1122 Type 2 diabetes mellitus with diabetic chronic kidney disease: Secondary | ICD-10-CM

## 2021-10-05 DIAGNOSIS — F339 Major depressive disorder, recurrent, unspecified: Secondary | ICD-10-CM | POA: Diagnosis not present

## 2021-10-05 DIAGNOSIS — E785 Hyperlipidemia, unspecified: Secondary | ICD-10-CM | POA: Diagnosis not present

## 2021-10-05 DIAGNOSIS — N183 Chronic kidney disease, stage 3 unspecified: Secondary | ICD-10-CM | POA: Diagnosis not present

## 2021-10-05 DIAGNOSIS — Z23 Encounter for immunization: Secondary | ICD-10-CM

## 2021-10-05 DIAGNOSIS — E1169 Type 2 diabetes mellitus with other specified complication: Secondary | ICD-10-CM

## 2021-10-05 DIAGNOSIS — E042 Nontoxic multinodular goiter: Secondary | ICD-10-CM | POA: Diagnosis not present

## 2021-10-05 DIAGNOSIS — I129 Hypertensive chronic kidney disease with stage 1 through stage 4 chronic kidney disease, or unspecified chronic kidney disease: Secondary | ICD-10-CM

## 2021-10-05 LAB — BAYER DCA HB A1C WAIVED: HB A1C (BAYER DCA - WAIVED): 7 % — ABNORMAL HIGH (ref 4.8–5.6)

## 2021-10-05 MED ORDER — ATORVASTATIN CALCIUM 40 MG PO TABS: ORAL_TABLET | ORAL | 1 refills | Status: DC

## 2021-10-05 MED ORDER — AMLODIPINE BESYLATE 5 MG PO TABS: 5.0000 mg | ORAL_TABLET | Freq: Every day | ORAL | 1 refills | Status: DC

## 2021-10-05 MED ORDER — JANUVIA 100 MG PO TABS: 100.0000 mg | ORAL_TABLET | Freq: Every day | ORAL | 1 refills | Status: DC

## 2021-10-05 MED ORDER — MONTELUKAST SODIUM 10 MG PO TABS: 10.0000 mg | ORAL_TABLET | Freq: Every day | ORAL | 1 refills | Status: DC

## 2021-10-05 MED ORDER — SERTRALINE HCL 50 MG PO TABS: 75.0000 mg | ORAL_TABLET | Freq: Every day | ORAL | 1 refills | Status: DC

## 2021-10-05 MED ORDER — LOSARTAN POTASSIUM 100 MG PO TABS: 100.0000 mg | ORAL_TABLET | Freq: Every day | ORAL | 1 refills | Status: DC

## 2021-10-05 NOTE — Assessment & Plan Note (Signed)
Rechecking labs today. Await results. Treat as needed.  °

## 2021-10-05 NOTE — Assessment & Plan Note (Signed)
Doing well with a1c of 7.0. Continue current regimen. Call with any concerns.

## 2021-10-05 NOTE — Assessment & Plan Note (Signed)
Stable. No concerns. Refills given.

## 2021-10-05 NOTE — Assessment & Plan Note (Signed)
Under good control on current regimen. Continue current regimen. Continue to monitor. Call with any concerns. Refills given.   

## 2021-10-05 NOTE — Progress Notes (Signed)
BP 132/82   Pulse 71   Wt 94 lb 6.4 oz (42.8 kg)   SpO2 95%   BMI 18.44 kg/m    Subjective:    Patient ID: Angela Neal, female    DOB: January 05, 1922, 88 y.o.   MRN: [id number]  HPI: Angela Neal is a 88 y.o. female  Chief Complaint  Patient presents with   Diabetes   Hyperlipidemia   Hypertension   Depression   DIABETES Hypoglycemic episodes:no Polydipsia/polyuria: no Visual disturbance: no Chest pain: no Paresthesias: no Glucose Monitoring: yes  Accucheck frequency: continuous Taking Insulin?: yes  Long acting insulin: levemir  Short acting insulin: novolog Blood Pressure Monitoring: a few times a week Retinal Examination: Not up to Date Foot Exam: Up to Date Diabetic Education: Completed Pneumovax: Up to Date Influenza: Up to Date Aspirin: yes  HYPERTENSION / HYPERLIPIDEMIA Satisfied with current treatment? yes Duration of hypertension: chronic BP monitoring frequency: not checking BP medication side effects: no Past BP meds: losartan, amlodipine Duration of hyperlipidemia: chronic Cholesterol medication side effects: no Cholesterol supplements: none Past cholesterol medications: atorvastatin Medication compliance: excellent compliance Aspirin: yes Recent stressors: no Recurrent headaches: no Visual changes: no Palpitations: no Dyspnea: no Chest pain: no Lower extremity edema: no Dizzy/lightheaded: no  DEPRESSION Mood status: stable Satisfied with current treatment?: yes Symptom severity: mild  Duration of current treatment : chronic Side effects: no Medication compliance: excellent compliance Psychotherapy/counseling: no  Previous psychiatric medications: sertraline Depressed mood: no Anxious mood: no Anhedonia: no Significant weight loss or gain: no Insomnia: no  Fatigue: no Feelings of worthlessness or guilt: no Impaired concentration/indecisiveness: no Suicidal ideations: no Hopelessness: no Crying spells: no    10/05/2021     1:24 PM 06/04/2021    2:12 PM 01/27/2021    9:51 AM 10/02/2020    1:36 PM 07/02/2020   10:58 AM  Depression screen PHQ 2/9  Decreased Interest 2 0 1 0 0  Down, Depressed, Hopeless 0 0 1 0 0  PHQ - 2 Score 2 0 2 0 0  Altered sleeping 1 0 1 0   Tired, decreased energy 1 0 1 0   Change in appetite 1 0 3 0   Feeling bad or failure about yourself  0 0 0 0   Trouble concentrating 3 0 3 0   Moving slowly or fidgety/restless 1 0 1 0   Suicidal thoughts 0 0 0 0   PHQ-9 Score 9 0 11 0   Difficult doing work/chores   Somewhat difficult Not difficult at all     Relevant past medical, surgical, family and social history reviewed and updated as indicated. Interim medical history since our last visit reviewed. Allergies and medications reviewed and updated.  Review of Systems  Constitutional: Negative.   Respiratory: Negative.    Cardiovascular: Negative.   Gastrointestinal: Negative.   Musculoskeletal: Negative.   Neurological: Negative.   Psychiatric/Behavioral: Negative.      Per HPI unless specifically indicated above     Objective:    BP 132/82   Pulse 71   Wt 94 lb 6.4 oz (42.8 kg)   SpO2 95%   BMI 18.44 kg/m   Wt Readings from Last 3 Encounters:  10/05/21 94 lb 6.4 oz (42.8 kg)  06/04/21 91 lb (41.3 kg)  01/27/21 89 lb 12.8 oz (40.7 kg)    Physical Exam Vitals and nursing note reviewed.  Constitutional:      General: She is not in acute distress.  Appearance: Normal appearance. She is normal weight. She is not ill-appearing, toxic-appearing or diaphoretic.  HENT:     Head: Normocephalic and atraumatic.     Right Ear: External ear normal.     Left Ear: External ear normal.     Nose: Nose normal.     Mouth/Throat:     Mouth: Mucous membranes are moist.     Pharynx: Oropharynx is clear.  Eyes:     General: No scleral icterus.       Right eye: No discharge.        Left eye: No discharge.     Extraocular Movements: Extraocular movements intact.      Conjunctiva/sclera: Conjunctivae normal.     Pupils: Pupils are equal, round, and reactive to light.  Cardiovascular:     Rate and Rhythm: Normal rate and regular rhythm.     Pulses: Normal pulses.     Heart sounds: Normal heart sounds. No murmur heard.    No friction rub. No gallop.  Pulmonary:     Effort: Pulmonary effort is normal. No respiratory distress.     Breath sounds: Normal breath sounds. No stridor. No wheezing, rhonchi or rales.  Chest:     Chest wall: No tenderness.  Musculoskeletal:        General: Normal range of motion.     Cervical back: Normal range of motion and neck supple.  Skin:    General: Skin is warm and dry.     Capillary Refill: Capillary refill takes less than 2 seconds.     Coloration: Skin is not jaundiced or pale.     Findings: No bruising, erythema, lesion or rash.  Neurological:     General: No focal deficit present.     Mental Status: She is alert and oriented to person, place, and time. Mental status is at baseline.  Psychiatric:        Mood and Affect: Mood normal.        Behavior: Behavior normal.        Thought Content: Thought content normal.        Judgment: Judgment normal.     Results for orders placed or performed in visit on 06/04/21  CBC with Differential/Platelet  Result Value Ref Range   WBC 9.0 3.4 - 10.8 x10E3/uL   RBC 4.80 3.77 - 5.28 x10E6/uL   Hemoglobin 14.5 11.1 - 15.9 g/dL   Hematocrit 43.8 34.0 - 46.6 %   MCV 91 79 - 97 fL   MCH 30.2 26.6 - 33.0 pg   MCHC 33.1 31.5 - 35.7 g/dL   RDW 13.7 11.7 - 15.4 %   Platelets 172 150 - 450 x10E3/uL   Neutrophils 65 Not Estab. %   Lymphs 17 Not Estab. %   Monocytes 10 Not Estab. %   Eos 7 Not Estab. %   Basos 1 Not Estab. %   Neutrophils Absolute 5.8 1.4 - 7.0 x10E3/uL   Lymphocytes Absolute 1.5 0.7 - 3.1 x10E3/uL   Monocytes Absolute 0.9 0.1 - 0.9 x10E3/uL   EOS (ABSOLUTE) 0.6 (H) 0.0 - 0.4 x10E3/uL   Basophils Absolute 0.1 0.0 - 0.2 x10E3/uL   Immature Granulocytes 0  Not Estab. %   Immature Grans (Abs) 0.0 0.0 - 0.1 x10E3/uL  Bayer DCA Hb A1c Waived  Result Value Ref Range   HB A1C (BAYER DCA - WAIVED) 6.5 (H) 4.8 - 5.6 %  Comprehensive metabolic panel  Result Value Ref Range   Glucose 88 70 - 99  mg/dL   BUN 22 10 - 36 mg/dL   Creatinine, Ser 0.98 0.57 - 1.00 mg/dL   eGFR 52 (L) >59 mL/min/1.73   BUN/Creatinine Ratio 22 12 - 28   Sodium 147 (H) 134 - 144 mmol/L   Potassium 3.6 3.5 - 5.2 mmol/L   Chloride 107 (H) 96 - 106 mmol/L   CO2 23 20 - 29 mmol/L   Calcium 9.6 8.7 - 10.3 mg/dL   Total Protein 7.2 6.0 - 8.5 g/dL   Albumin 4.5 3.5 - 4.6 g/dL   Globulin, Total 2.7 1.5 - 4.5 g/dL   Albumin/Globulin Ratio 1.7 1.2 - 2.2   Bilirubin Total 0.4 0.0 - 1.2 mg/dL   Alkaline Phosphatase 104 44 - 121 IU/L   AST 29 0 - 40 IU/L   ALT 31 0 - 32 IU/L  Lipid Panel w/o Chol/HDL Ratio  Result Value Ref Range   Cholesterol, Total 151 100 - 199 mg/dL   Triglycerides 105 0 - 149 mg/dL   HDL 60 >39 mg/dL   VLDL Cholesterol Cal 19 5 - 40 mg/dL   LDL Chol Calc (NIH) 72 0 - 99 mg/dL      Assessment & Plan:   Problem List Items Addressed This Visit       Endocrine   Type 2 diabetes mellitus with renal complication (H[facility])    Doing well with a1c of 7.0. Continue current regimen. Call with any concerns.       Relevant Medications   atorvastatin (LIPITOR) 40 MG tablet   JANUVIA 100 MG tablet   losartan (COZAAR) 100 MG tablet   Other Relevant Orders   Bayer DCA Hb A1c Waived   Hyperlipidemia associated with type 2 diabetes mellitus ([facility])    Stable. No concerns. Refills given.       Relevant Medications   amLODipine (NORVASC) 5 MG tablet   atorvastatin (LIPITOR) 40 MG tablet   JANUVIA 100 MG tablet   losartan (COZAAR) 100 MG tablet   Multinodular goiter    Rechecking labs today. Await results. Treat as needed.         Genitourinary   Benign hypertensive renal disease    Doing well on recheck. Refills given. Call with any concerns.          Other   Depression, recurrent ([place])    Under good control on current regimen. Continue current regimen. Continue to monitor. Call with any concerns. Refills given.        Relevant Medications   sertraline (ZOLOFT) 50 MG tablet   Other Visit Diagnoses     Need for influenza vaccination    -  Primary   Relevant Orders   Flu Vaccine QUAD High Dose(Fluad) (Completed)        Follow up plan: Return in about 4 months (around 02/05/2022) for physical.

## 2021-10-05 NOTE — Assessment & Plan Note (Signed)
Doing well on recheck. Refills given. Call with any concerns.

## 2021-10-10 DIAGNOSIS — E1122 Type 2 diabetes mellitus with diabetic chronic kidney disease: Secondary | ICD-10-CM | POA: Diagnosis not present

## 2021-10-20 ENCOUNTER — Other Ambulatory Visit: Payer: Self-pay | Admitting: Family Medicine

## 2021-10-20 NOTE — Telephone Encounter (Signed)
Requested Prescriptions  Pending Prescriptions Disp Refills  . psyllium (GNP NATURAL FIBER) 0.52 g capsule [Pharmacy Med Name: [facility] NATURAL FIBER 0.52 GM CAP] 90 capsule 0    Sig: Take 1 capsule (0.52 g total) by mouth daily at 12 noon.     Over the Counter:  OTC Passed - 10/20/2021  9:02 AM      Passed - Valid encounter within last 12 months    Recent Outpatient Visits          2 weeks ago Need for influenza vaccination   [place], Megan P, DO   4 months ago Type 2 diabetes mellitus with stage 3 chronic kidney disease, with long-term current use of insulin, unspecified whether stage 3a or 3b CKD ([place])   [place], Megan P, DO   8 months ago Type 2 diabetes mellitus with stage 3 chronic kidney disease, with long-term current use of insulin, unspecified whether stage 3a or 3b CKD ([place])   [place], [place], DO   1 year ago Need for immunization against influenza   [place], [place], DO   1 year ago Benign hypertensive renal disease   [facility] Valerie Roys, DO      Future Appointments            In 3 months Wynetta Emer[place], Barb Merino, DO [facility]

## 2021-10-21 ENCOUNTER — Other Ambulatory Visit: Payer: Self-pay | Admitting: Family Medicine

## 2021-10-21 NOTE — Telephone Encounter (Signed)
Requested Prescriptions  Pending Prescriptions Disp Refills  . ULTRACARE PEN NEEDLES 33G X 4 MM MISC [Pharmacy Med Name: ULTRACARE PEN NEEDLES 33G X 4 MM] 100 each 0    Sig: USE WITH INSULIN PENS AS DIRECTED BY PHYSICIAN (CURRENTLY 4 TIMES DAILY)     Endocrinology: Diabetes - Testing Supplies Passed - 10/21/2021 11:54 AM      Passed - Valid encounter within last 12 months    Recent Outpatient Visits          2 weeks ago Need for influenza vaccination   [facility] [place], Megan P, DO   4 months ago Type 2 diabetes mellitus with stage 3 chronic kidney disease, with long-term current use of insulin, unspecified whether stage 3a or 3b CKD ([place])   [facility] Johnson, Megan P, DO   8 months ago Type 2 diabetes mellitus with stage 3 chronic kidney disease, with long-term current use of insulin, unspecified whether stage 3a or 3b CKD ([place])   [place], [place], DO   1 year ago Need for immunization against influenza   [place], [place], DO   1 year ago Benign hypertensive renal disease   [facility] [place], Barb Merino, DO      Future Appointments            In 3 months Johnson, Barb Merino, DO [employer]

## 2021-11-13 ENCOUNTER — Other Ambulatory Visit: Payer: Self-pay | Admitting: Family Medicine

## 2021-11-13 NOTE — Telephone Encounter (Signed)
Requested Prescriptions  Pending Prescriptions Disp Refills   ULTRACARE PEN NEEDLES 33G X 4 MM MISC [Pharmacy Med Name: ULTRACARE PEN NEEDLES 33G X 4 MM] 100 each 1    Sig: USE WITH INSULIN PENS AS DIRECTED BY PHYSICIAN (CURRENTLY 4 TIMES DAILY)     Endocrinology: Diabetes - Testing Supplies Passed - 11/13/2021  9:47 AM      Passed - Valid encounter within last 12 months    Recent Outpatient Visits           1 month ago Need for influenza vaccination   [facility] [place], Megan P, DO   5 months ago Type 2 diabetes mellitus with stage 3 chronic kidney disease, with long-term current use of insulin, unspecified whether stage 3a or 3b CKD ([place])   [facility] Johnson, Megan P, DO   9 months ago Type 2 diabetes mellitus with stage 3 chronic kidney disease, with long-term current use of insulin, unspecified whether stage 3a or 3b CKD ([place])   [place], [place], DO   1 year ago Need for immunization against influenza   [place], Eugenio Saenz, DO   1 year ago Benign hypertensive renal disease   [facility] Valerie Roys, DO       Future Appointments             In 2 months Johnson, Barb Merino, DO [employer]

## 2021-11-16 LAB — HM DIABETES EYE EXAM

## 2021-11-19 ENCOUNTER — Other Ambulatory Visit: Payer: Self-pay | Admitting: Family Medicine

## 2021-11-19 NOTE — Telephone Encounter (Signed)
Requested medication (s) are due for refill today: yes  Requested medication (s) are on the active medication list: yes  Last refill:  06/25/21 #90 1 refills  Future visit scheduled: yes in 2 months  Notes to clinic:  manual review by provider per protocol. Do you want to refill Rx?     Requested Prescriptions  Pending Prescriptions Disp Refills   GNP VITAMIN D3 EXTRA STRENGTH 25 MCG (1000 UT) tablet [Pharmacy Med Name: GNP VITAMIN D3 EXTRA STRENGTH 25 MC] 90 tablet 1    Sig: TAKE 1 TABLET BY MOUTH ONCE [place]     Endocrinology:  Vitamins - Vitamin D Supplementation 2 Failed - 11/19/2021  3:08 PM      Failed - Manual Review: Route requests for 50,000 IU strength to the provider      Failed - Vitamin D in normal range and within 360 days    No results found for: "VE7209OB0", "JG2836OQ9", "UT654YT0PTW", "25OHVITD3", "25OHVITD2", "65KCLEXN1", "VD25OH"       Passed - Ca in normal range and within 360 days    Calcium  Date Value Ref Range Status  06/04/2021 9.6 8.7 - 10.3 mg/dL Final   Calcium, Total  Date Value Ref Range Status  04/26/2014 11.4 (H) mg/dL Final    Comment:    8.9-10.3 NOTE: New Reference Range  03/12/14          Passed - Valid encounter within last 12 months    Recent Outpatient Visits           1 month ago Need for influenza vaccination   [facility] [place], Megan P, DO   5 months ago Type 2 diabetes mellitus with stage 3 chronic kidney disease, with long-term current use of insulin, unspecified whether stage 3a or 3b CKD ([place])   [place], Megan P, DO   9 months ago Type 2 diabetes mellitus with stage 3 chronic kidney disease, with long-term current use of insulin, unspecified whether stage 3a or 3b CKD ([place])   [place], [place], DO   1 year ago Need for immunization against influenza   [place], [place], DO   1 year ago Benign hypertensive renal  disease   [facility] Valerie Roys, DO       Future Appointments             In 2 months Wynetta Emery, Barb Merino, DO [employer]

## 2021-12-08 ENCOUNTER — Other Ambulatory Visit: Payer: Self-pay | Admitting: Family Medicine

## 2021-12-08 NOTE — Telephone Encounter (Signed)
Requested Prescriptions  Pending Prescriptions Disp Refills   ULTRACARE PEN NEEDLES 33G X 4 MM MISC [Pharmacy Med Name: ULTRACARE PEN NEEDLES 33G X 4 MM] 100 each 1    Sig: USE WITH INSULIN PENS AS DIRECTED BY PHYSICIAN (CURRENTLY 4 TIMES DAILY)     Endocrinology: Diabetes - Testing Supplies Passed - 12/08/2021 11:45 AM      Passed - Valid encounter within last 12 months    Recent Outpatient Visits           2 months ago Need for influenza vaccination   [facility], Megan P, DO   6 months ago Type 2 diabetes mellitus with stage 3 chronic kidney disease, with long-term current use of insulin, unspecified whether stage 3a or 3b CKD ([place])   [facility] [place], Megan P, DO   10 months ago Type 2 diabetes mellitus with stage 3 chronic kidney disease, with long-term current use of insulin, unspecified whether stage 3a or 3b CKD ([place])   [place], [place], DO   1 year ago Need for immunization against influenza   [place], [place], DO   1 year ago Benign hypertensive renal disease   [facility] [place], Barb Merino, DO       Future Appointments             In 1 month Johnson, Barb Merino, DO [employer]

## 2021-12-09 ENCOUNTER — Emergency Department: Payer: HMO

## 2021-12-09 ENCOUNTER — Other Ambulatory Visit: Payer: Self-pay

## 2021-12-09 ENCOUNTER — Emergency Department
Admission: EM | Admit: 2021-12-09 | Discharge: 2021-12-09 | Disposition: A | Discharge status: Home / Self Care | Payer: HMO | Attending: Emergency Medicine | Admitting: Emergency Medicine

## 2021-12-09 DIAGNOSIS — I6782 Cerebral ischemia: Secondary | ICD-10-CM | POA: Insufficient documentation

## 2021-12-09 DIAGNOSIS — E119 Type 2 diabetes mellitus without complications: Secondary | ICD-10-CM | POA: Insufficient documentation

## 2021-12-09 DIAGNOSIS — J449 Chronic obstructive pulmonary disease, unspecified: Secondary | ICD-10-CM | POA: Diagnosis not present

## 2021-12-09 DIAGNOSIS — J45909 Unspecified asthma, uncomplicated: Secondary | ICD-10-CM | POA: Diagnosis not present

## 2021-12-09 DIAGNOSIS — I10 Essential (primary) hypertension: Secondary | ICD-10-CM | POA: Diagnosis not present

## 2021-12-09 DIAGNOSIS — Z1152 Encounter for screening for COVID-19: Secondary | ICD-10-CM | POA: Diagnosis not present

## 2021-12-09 DIAGNOSIS — Z8616 Personal history of COVID-19: Secondary | ICD-10-CM | POA: Insufficient documentation

## 2021-12-09 DIAGNOSIS — W19XXXA Unspecified fall, initial encounter: Secondary | ICD-10-CM | POA: Diagnosis not present

## 2021-12-09 DIAGNOSIS — M25552 Pain in left hip: Secondary | ICD-10-CM | POA: Diagnosis not present

## 2021-12-09 DIAGNOSIS — M79605 Pain in left leg: Secondary | ICD-10-CM | POA: Diagnosis present

## 2021-12-09 DIAGNOSIS — M25562 Pain in left knee: Secondary | ICD-10-CM | POA: Diagnosis not present

## 2021-12-09 LAB — BASIC METABOLIC PANEL
Anion gap: 12 (ref 5–15)
BUN: 18 mg/dL (ref 8–23)
CO2: 21 mmol/L — ABNORMAL LOW (ref 22–32)
Calcium: 9.1 mg/dL (ref 8.9–10.3)
Chloride: 108 mmol/L (ref 98–111)
Creatinine, Ser: 0.98 mg/dL (ref 0.44–1.00)
GFR, Estimated: 52 mL/min — ABNORMAL LOW (ref >60)
Glucose, Bld: 150 mg/dL — ABNORMAL HIGH (ref 70–99)
Potassium: 3.8 mmol/L (ref 3.5–5.1)
Sodium: 141 mmol/L (ref 135–145)

## 2021-12-09 LAB — RESP PANEL BY RT-PCR (FLU A&B, COVID) ARPGX2
Influenza A by PCR: NEGATIVE
Influenza B by PCR: NEGATIVE
SARS Coronavirus 2 by RT PCR: NEGATIVE

## 2021-12-09 LAB — CBC
HCT: 40.2 % (ref 36.0–46.0)
Hemoglobin: 13.6 g/dL (ref 12.0–15.0)
MCH: 31.3 pg (ref 26.0–34.0)
MCHC: 33.8 g/dL (ref 30.0–36.0)
MCV: 92.6 fL (ref 80.0–100.0)
Platelets: 156 10*3/uL (ref 150–400)
RBC: 4.34 MIL/uL (ref 3.87–5.11)
RDW: 13.3 % (ref 11.5–15.5)
WBC: 14.4 10*3/uL — ABNORMAL HIGH (ref 4.0–10.5)
nRBC: 0 % (ref 0.0–0.2)

## 2021-12-09 LAB — URINALYSIS, COMPLETE (UACMP) WITH MICROSCOPIC
Bacteria, UA: NONE SEEN
Bilirubin Urine: NEGATIVE
Glucose, UA: NEGATIVE mg/dL
Hgb urine dipstick: NEGATIVE
Ketones, ur: 5 mg/dL — AB
Nitrite: NEGATIVE
Protein, ur: 100 mg/dL — AB
Specific Gravity, Urine: 1.024 (ref 1.005–1.030)
pH: 5 (ref 5.0–8.0)

## 2021-12-09 MED ORDER — ACETAMINOPHEN 500 MG PO TABS
1000.0000 mg | ORAL_TABLET | Freq: Once | ORAL | Status: AC
  Administered 2021-12-09: 1000 mg via ORAL
  Filled 2021-12-09: qty 2

## 2021-12-09 MED ORDER — ONDANSETRON 4 MG PO TBDP
4.0000 mg | ORAL_TABLET | Freq: Once | ORAL | Status: AC
  Administered 2021-12-09: 4 mg via ORAL
  Filled 2021-12-09: qty 1

## 2021-12-09 NOTE — Discharge Instructions (Signed)
You have been seen in the emergency department after a fall.  Your work-up shows no significant injuries.  Please use Tylenol as needed for discomfort as written on the box.  Return to the emergency department for any worsening pain, significant pain, or any other symptoms concerning to yourself or staff member.

## 2021-12-09 NOTE — ED Notes (Signed)
Patient's daughter (POA) declined vital signs.

## 2021-12-09 NOTE — ED Triage Notes (Signed)
Pt comes with c/o left leg pain after fall this am. Pt is not on thinners. Pt has unwitnessed fall. Unsure if pt hit head. Pt states pain to left hip area.

## 2021-12-09 NOTE — ED Provider Notes (Addendum)
[facility] Provider Note    Event Date/Time   First MD Initiated Contact with Patient 12/09/21 1252     (approximate)  History   Chief Complaint: Fall  HPI  Angela Neal is a 88 y.o. female with a past medical history of asthma, COPD, diabetes, hypertension, prior CVA, who presents to the emergency department from her group facility after a fall this morning.  According to the daughter and per report patient had a witnessed fall at her nursing facility but they are not sure if the patient hit her head.  They state the patient has been complaining of nausea throughout the morning so the daughter brought her to the emergency department for evaluation.  Patient was complaining of some pain to the left leg but is unable to localize where the discomfort is.  Denies any recent illnesses fevers abdominal pain or chest pain.  Daughter states the patient often complains of nausea that is not a typical.  Physical Exam   Triage Vital Signs: ED Triage Vitals  Enc Vitals Group     BP 12/09/21 1007 139/75     Pulse Rate 12/09/21 1007 82     Resp 12/09/21 1007 16     Temp 12/09/21 1007 98 F (36.7 C)     Temp src --      SpO2 12/09/21 1007 99 %     Weight 12/09/21 1007 95 lb (43.1 kg)     Height 12/09/21 1007 '4\' 11"'$  (1.499 m)     Head Circumference --      Peak Flow --      Pain Score 12/09/21 1005 4     Pain Loc --      Pain Edu? --      Excl. in [place]? --     Most recent vital signs: Vitals:   12/09/21 1007  BP: 139/75  Pulse: 82  Resp: 16  Temp: 98 F (36.7 C)  SpO2: 99%    General: Awake, no distress.  CV:  Good peripheral perfusion.  Regular rate and rhythm  Resp:  Normal effort.  Equal breath sounds bilaterally.  Abd:  No distention.  Soft, nontender.  No rebound or guarding. Other:  Good range of motion bilateral upper extremities.  No CT or L-spine tenderness.  Patient does have mild tenderness in left lower extremity more so of the left knee  than the left hip.  Neurovascular intact distally.  Good range of motion in the right lower extremity.   ED Results / Procedures / Treatments   EKG  EKG viewed and interpreted by myself shows a normal sinus rhythm at 77 bpm with a narrow QRS, normal axis, normal intervals, nonspecific ST changes  RADIOLOGY  I have reviewed and interpreted the CT head images.  I do not see any large bleed on my evaluation. Radiology is read the CT scan is negative for acute abnormality. CT read as different height loss and changes throughout the C-spine especially at C6/C7 as well as T2.  Patient has absolutely no point tenderness throughout the C or T-spine.   MEDICATIONS ORDERED IN ED: Medications  acetaminophen (TYLENOL) tablet 1,000 mg (has no administration in time range)  ondansetron (ZOFRAN-ODT) disintegrating tablet 4 mg (has no administration in time range)     IMPRESSION / MDM / ASSESSMENT AND PLAN / ED COURSE  I reviewed the triage vital signs and the nursing notes.  Patient's presentation is most consistent with acute presentation with potential threat  to life or bodily function.  Patient presents emergency department after a fall this morning at her group facility.  Overall the patient appears well, no distress.  Patient does have some pain when moving her left lower extremity although does not appear to be overly significant.  X-rays of the hip and pelvis are negative for acute abnormality.  CT images appear negative as well.  Patient has absolutely no CT or L-spine tenderness to palpation on my examination and denies any pain to these areas.  Patient does appear to have some pain around the left knee with range of motion of the left knee we will obtain x-rays of the left knee to further evaluate.  We will treat discomfort with Tylenol and nausea with Zofran.  We will also obtain a COVID swab.  Patient's lab work does show mild leukocytosis otherwise reassuring CBC and chemistry.  Given the  patient's nausea with a fall we will obtain a COVID/flu swab to rule out viral infection that could have caused weakness or nausea which could have led to the patient's fall.  Patient's work-up is reassuring.  Her knee x-ray is negative.  Urinalysis is normal.  COVID and flu are normal.  Mild leukocytosis on CBC, chemistry is reassuring.  Patient states she is feeling better she has received Tylenol and Zofran.  We will attempt to ambulate the patient.  I believe the patient will be able to be discharged back to her group facility, daughter states if she is unstable on her feet then she will take the patient home and stay with her.  FINAL CLINICAL IMPRESSION(S) / ED DIAGNOSES   Fall   Note:  This document was prepared using Dragon voice recognition software and may include unintentional dictation errors.   Harvest Dark, MD 12/09/21 1413    Harvest Dark, MD 12/23/21 [phone]

## 2021-12-14 ENCOUNTER — Ambulatory Visit: Payer: Self-pay

## 2021-12-14 NOTE — Telephone Encounter (Signed)
     Chief Complaint: Pt. [place] 12/09/21 and seen in ED. Has large bruise to left knee, decreased mobility due to fall. Currently staying with her daughter, Hoyle Sauer. Symptoms: Above Frequency: 12/09/21 Pertinent Negatives: Patient denies  Disposition: '[]'$ ED /'[]'$ Urgent Care (no appt availability in office) / '[]'$ Appointment(In office/virtual)/ '[]'$  [place] [facility]/ '[]'$ Home Care/ '[]'$ Refused Recommended Disposition /'[]'$ [place] [facility]/ '[x]'$  Follow-up with PCP Additional Notes: Daughter states it "hard to get her out of the house, but if Dr. Wynetta Emery wants to see her we can try." States her appetite is still decreased and blood glucose is staying over 200. Wants to know what Dr. Wynetta Emery thinks needs done. Answer Assessment - Initial Assessment Questions 1. MECHANISM: "How did the fall happen?"     Tripped 2. DOMESTIC VIOLENCE AND ELDER ABUSE SCREENING: "Did you fall because someone pushed you or tried to hurt you?" If Yes, ask: "Are you safe now?"     No 3. ONSET: "When did the fall happen?" (e.g., minutes, hours, or days ago)     12/09/21 - seen in ED 4. LOCATION: "What part of the body hit the ground?" (e.g., back, buttocks, head, hips, knees, hands, head, stomach)     Unsure 5. INJURY: "Did you hurt (injure) yourself when you fell?" If Yes, ask: "What did you injure? Tell me more about this?" (e.g., body area; type of injury; pain severity)"     Left leg, knee 6. PAIN: "Is there any pain?" If Yes, ask: "How bad is the pain?" (e.g., Scale 1-10; or mild,  moderate, severe)   - NONE (0): No pain   - MILD (1-3): Doesn't interfere with normal activities    - MODERATE (4-7): Interferes with normal activities or awakens from sleep    - SEVERE (8-10): Excruciating pain, unable to do any normal activities      Moderate when walking 7. SIZE: For cuts, bruises, or swelling, ask: "How large is it?" (e.g., inches or centimeters)      No 8. PREGNANCY: "Is there any chance you are pregnant?" "When was  your last menstrual period?"     no 9. OTHER SYMPTOMS: "Do you have any other symptoms?" (e.g., dizziness, fever, weakness; new onset or worsening).      No appetite 10. CAUSE: "What do you think caused the fall (or falling)?" (e.g., tripped, dizzy spell)       Tripped  Protocols used: Falls and [facility]

## 2021-12-18 NOTE — Telephone Encounter (Signed)
Can we see how she's doing- if still not well, can we get her in next week?

## 2021-12-18 NOTE — Telephone Encounter (Signed)
Spoke with patient's daughter says patient still isn't walking much on her own. Patient daughter says patient sugar readings are elevated and she gave patient her shot. Daughter also says patient's is barely eating, but says she is trying to get patient liquids. Daughter says patient was really bruised from the fall. Patient daughter says she is doing better. Please advise about appetite.

## 2021-12-21 ENCOUNTER — Telehealth: Payer: Self-pay

## 2021-12-21 ENCOUNTER — Ambulatory Visit: Payer: Self-pay

## 2021-12-21 NOTE — Telephone Encounter (Signed)
I can see her at 9:40 tomorrow

## 2021-12-21 NOTE — Telephone Encounter (Signed)
Pt scheduled tomorrow at 1:20

## 2021-12-21 NOTE — Telephone Encounter (Signed)
    Chief Complaint: Still having left leg pain from fall last week. Symptoms: Pain Frequency: last week Pertinent Negatives: Patient denies  Disposition: '[]'$ ED /'[]'$ Urgent Care (no appt availability in office) / '[]'$ Appointment(In office/virtual)/ '[]'$  [place] [facility]/ '[]'$ Home Care/ '[]'$ Refused Recommended Disposition /'[]'$ [place] [facility]/ '[]'$  Follow-up with PCP Additional Notes: Asking to be worked in. Will go back to ED if needed.  Please advise daughter.  Answer Assessment - Initial Assessment Questions 1. ONSET: "When did the pain start?"      Last week 2. LOCATION: "Where is the pain located?"      Knee and down the leg 3. PAIN: "How bad is the pain?"    (Scale 1-10; or mild, moderate, severe)   -  MILD (1-3): doesn't interfere with normal activities    -  MODERATE (4-7): interferes with normal activities (e.g., work or school) or awakens from sleep, limping    -  SEVERE (8-10): excruciating pain, unable to do any normal activities, unable to walk     Severe 4. WORK OR EXERCISE: "Has there been any recent work or exercise that involved this part of the body?"      Fell 5. CAUSE: "What do you think is causing the leg pain?"     Fall 6. OTHER SYMPTOMS: "Do you have any other symptoms?" (e.g., chest pain, back pain, breathing difficulty, swelling, rash, fever, numbness, weakness)     No 7. PREGNANCY: "Is there any chance you are pregnant?" "When was your last menstrual period?"     No  Protocols used: Leg Pain-A-AH

## 2021-12-21 NOTE — Telephone Encounter (Signed)
        Patient  visited [place] on 12/6     Telephone encounter attempt :  1st  A HIPAA compliant voice message was left requesting a return call.  Instructed patient to call back .    [place], Care Management  [phone] [address], [place], [place] [address] Phone: [phone] Email: [email or web address]

## 2021-12-22 ENCOUNTER — Ambulatory Visit (INDEPENDENT_AMBULATORY_CARE_PROVIDER_SITE_OTHER): Payer: PPO | Admitting: Family Medicine

## 2021-12-22 ENCOUNTER — Encounter: Payer: Self-pay | Admitting: Family Medicine

## 2021-12-22 ENCOUNTER — Telehealth: Payer: Self-pay

## 2021-12-22 VITALS — BP 137/82 | HR 98 | Temp 97.4°F

## 2021-12-22 DIAGNOSIS — R630 Anorexia: Secondary | ICD-10-CM

## 2021-12-22 DIAGNOSIS — M79605 Pain in left leg: Secondary | ICD-10-CM

## 2021-12-22 DIAGNOSIS — Z794 Long term (current) use of insulin: Secondary | ICD-10-CM

## 2021-12-22 DIAGNOSIS — W19XXXD Unspecified fall, subsequent encounter: Secondary | ICD-10-CM

## 2021-12-22 DIAGNOSIS — E1122 Type 2 diabetes mellitus with diabetic chronic kidney disease: Secondary | ICD-10-CM

## 2021-12-22 DIAGNOSIS — H3530 Unspecified macular degeneration: Secondary | ICD-10-CM

## 2021-12-22 DIAGNOSIS — N183 Chronic kidney disease, stage 3 unspecified: Secondary | ICD-10-CM

## 2021-12-22 DIAGNOSIS — S8012XA Contusion of left lower leg, initial encounter: Secondary | ICD-10-CM

## 2021-12-22 DIAGNOSIS — J449 Chronic obstructive pulmonary disease, unspecified: Secondary | ICD-10-CM

## 2021-12-22 DIAGNOSIS — R8290 Unspecified abnormal findings in urine: Secondary | ICD-10-CM

## 2021-12-22 DIAGNOSIS — I129 Hypertensive chronic kidney disease with stage 1 through stage 4 chronic kidney disease, or unspecified chronic kidney disease: Secondary | ICD-10-CM

## 2021-12-22 LAB — URINALYSIS, ROUTINE W REFLEX MICROSCOPIC
Bilirubin, UA: NEGATIVE
Leukocytes,UA: NEGATIVE
Nitrite, UA: NEGATIVE
RBC, UA: NEGATIVE
Specific Gravity, UA: 1.015 (ref 1.005–1.030)
Urobilinogen, Ur: 1 mg/dL (ref 0.2–1.0)
pH, UA: 5 (ref 5.0–7.5)

## 2021-12-22 LAB — MICROSCOPIC EXAMINATION
Bacteria, UA: NONE SEEN
RBC, Urine: NONE SEEN /hpf (ref 0–2)

## 2021-12-22 MED ORDER — LIDOCAINE 5 % EX PTCH: 1.0000 | MEDICATED_PATCH | CUTANEOUS | 0 refills | Status: DC

## 2021-12-22 MED ORDER — MIRTAZAPINE 15 MG PO TABS: 15.0000 mg | ORAL_TABLET | Freq: Every day | ORAL | 3 refills | Status: DC

## 2021-12-22 MED ORDER — NAPROXEN 500 MG PO TABS: 500.0000 mg | ORAL_TABLET | Freq: Two times a day (BID) | ORAL | 0 refills | Status: DC

## 2021-12-22 NOTE — Progress Notes (Addendum)
BP 137/82   Pulse 98   Temp (!) 97.4 F (36.3 C) (Oral)   SpO2 98%    Subjective:    Patient ID: Angela Neal, female    DOB: 10-Sep-1921, 88 y.o.   MRN: [id number]  HPI: Angela Neal is a 88 y.o. female  Chief Complaint  Patient presents with   Leg Pain    Patient daughter says the patient isn't mobile due to the leg pain from a fall on 12/06. Daughter says the leg is on the same side of patient prior hip replaced. Patient can move the leg forward and back, but daughter says she cannot stand on it.    Fell on 12/6 and went to the ER. X-rays of knee and hip had been normal but had significant bone bruise. Was walking after that and was not in significant pain. She states that pain started about 6 days ago. Pain is in her L leg near the bruise. It has been improving with ibuprofen and ice. She has not had any falls since she got home. Has been using advil and that seems to he helping. She has stopped walking for the last 6 days and is having significantly more trouble getting around. She is able to get to her bedside commode, but that is it.   Daughter also notes that her appetite is down significantly. They can't seem to get her to eat more than a couple of bites. She has been having some foul smelling urine, but otherwise seems OK. No other concerns or complaints at this time.   Relevant past medical, surgical, family and social history reviewed and updated as indicated. Interim medical history since our last visit reviewed. Allergies and medications reviewed and updated.  Review of Systems  Constitutional:  Positive for appetite change. Negative for activity change, chills, diaphoresis, fatigue, fever and unexpected weight change.  Eyes: Negative.   Respiratory: Negative.    Cardiovascular: Negative.   Musculoskeletal:  Positive for arthralgias, gait problem and myalgias. Negative for back pain, joint swelling, neck pain and neck stiffness.  Psychiatric/Behavioral: Negative.       Per HPI unless specifically indicated above     Objective:    BP 137/82   Pulse 98   Temp (!) 97.4 F (36.3 C) (Oral)   SpO2 98%   Wt Readings from Last 3 Encounters:  12/09/21 95 lb (43.1 kg)  10/05/21 94 lb 6.4 oz (42.8 kg)  06/04/21 91 lb (41.3 kg)    Physical Exam Vitals and nursing note reviewed.  Constitutional:      General: She is not in acute distress.    Appearance: Normal appearance. She is not ill-appearing, toxic-appearing or diaphoretic.     Comments: Wheelchair bound  HENT:     Head: Normocephalic and atraumatic.     Right Ear: External ear normal.     Left Ear: External ear normal.     Nose: Nose normal.     Mouth/Throat:     Mouth: Mucous membranes are moist.     Pharynx: Oropharynx is clear.  Eyes:     General: No scleral icterus.       Right eye: No discharge.        Left eye: No discharge.     Extraocular Movements: Extraocular movements intact.     Conjunctiva/sclera: Conjunctivae normal.     Pupils: Pupils are equal, round, and reactive to light.  Cardiovascular:     Rate and Rhythm: Normal rate and regular  rhythm.     Pulses: Normal pulses.     Heart sounds: Normal heart sounds. No murmur heard.    No friction rub. No gallop.  Pulmonary:     Effort: Pulmonary effort is normal. No respiratory distress.     Breath sounds: Normal breath sounds. No stridor. No wheezing, rhonchi or rales.  Chest:     Chest wall: No tenderness.  Musculoskeletal:        General: Swelling and tenderness (significant bruising and tenderness to L anterior shin) present. Normal range of motion.     Cervical back: Normal range of motion and neck supple.  Skin:    General: Skin is warm and dry.     Capillary Refill: Capillary refill takes less than 2 seconds.     Coloration: Skin is not jaundiced or pale.     Findings: No bruising, erythema, lesion or rash.  Neurological:     General: No focal deficit present.     Mental Status: She is alert and oriented to  person, place, and time. Mental status is at baseline.  Psychiatric:        Mood and Affect: Mood normal.        Behavior: Behavior normal.        Thought Content: Thought content normal.        Judgment: Judgment normal.     Results for orders placed or performed in visit on 12/22/21  Microscopic Examination   Urine  Result Value Ref Range   WBC, UA 0-5 0 - 5 /hpf   RBC, Urine None seen 0 - 2 /hpf   Epithelial Cells (non renal) 0-10 0 - 10 /hpf   Bacteria, UA None seen None seen/Few  Urinalysis, Routine w reflex microscopic  Result Value Ref Range   Specific Gravity, UA 1.015 1.005 - 1.030   pH, UA 5.0 5.0 - 7.5   Color, UA Yellow Yellow   Appearance Ur Clear Clear   Leukocytes,UA Negative Negative   Protein,UA 1+ (A) Negative/Trace   Glucose, UA 2+ (A) Negative   Ketones, UA 2+ (A) Negative   RBC, UA Negative Negative   Bilirubin, UA Negative Negative   Urobilinogen, Ur 1.0 0.2 - 1.0 mg/dL   Nitrite, UA Negative Negative   Microscopic Examination See below:       Assessment & Plan:   Problem List Items Addressed This Visit       Respiratory   COPD (chronic obstructive pulmonary disease) ([place])   Relevant Orders   Ambulatory referral to Home Health     Endocrine   Type 2 diabetes mellitus with renal complication ([place])   Relevant Orders   Ambulatory referral to Home Health     Genitourinary   Benign hypertensive renal disease   Relevant Orders   Ambulatory referral to [place]     Other   Macular degeneration   Relevant Orders   Ambulatory referral to [place]   Other Visit Diagnoses     Left leg pain    -  Primary   Will treat with naproxen and lidoderm and get her into home PT to get her moving again. Call with any concerns.   Relevant Orders   Ambulatory referral to [place]   Decreased appetite       Will start mirtazapine and recheck 1 month. Call with any concerns.   Relevant Orders   Ambulatory referral to [place],  subsequent encounter  Will get her set up with home PT given inability to walk since last fall. X-rays normal.   Relevant Orders   Ambulatory referral to [place] smelling urine       Checking urine today. Await results.   Relevant Orders   Ambulatory referral to [place]   Urinalysis, Routine w reflex microscopic (Completed)   Microscopic Examination (Completed)   Contusion of left lower extremity, initial encounter       Due to fall, significant pain. Will get her in for PT and will treat with lidocaine and naproxen.   Relevant Orders   Ambulatory referral to [place]        Follow up plan: Return in about 4 weeks (around 01/19/2022).

## 2021-12-22 NOTE — Telephone Encounter (Signed)
     Patient  visit on 12/6  at [facility]  Have you been able to follow up with your primary care physician? Yes  The patient was or was not able to obtain any needed medicine or equipment. Yes   Are there diet recommendations that you are having difficulty following? NA  Patient expresses understanding of discharge instructions and education provided has no other needs at this time.  YES      Angela Neal Pop [facility] Care Guide, [place], Care Management  [phone] [address], [place], [place] [address] Phone: [phone] Email: [email or web address]    

## 2022-01-09 DIAGNOSIS — M1389 Other specified arthritis, multiple sites: Secondary | ICD-10-CM | POA: Diagnosis not present

## 2022-01-09 DIAGNOSIS — S8012XD Contusion of left lower leg, subsequent encounter: Secondary | ICD-10-CM | POA: Diagnosis not present

## 2022-01-09 DIAGNOSIS — E1169 Type 2 diabetes mellitus with other specified complication: Secondary | ICD-10-CM | POA: Diagnosis not present

## 2022-01-09 DIAGNOSIS — I129 Hypertensive chronic kidney disease with stage 1 through stage 4 chronic kidney disease, or unspecified chronic kidney disease: Secondary | ICD-10-CM | POA: Diagnosis not present

## 2022-01-09 DIAGNOSIS — E785 Hyperlipidemia, unspecified: Secondary | ICD-10-CM | POA: Diagnosis not present

## 2022-01-09 DIAGNOSIS — N183 Chronic kidney disease, stage 3 unspecified: Secondary | ICD-10-CM | POA: Diagnosis not present

## 2022-01-09 DIAGNOSIS — Z794 Long term (current) use of insulin: Secondary | ICD-10-CM | POA: Diagnosis not present

## 2022-01-09 DIAGNOSIS — Z8673 Personal history of transient ischemic attack (TIA), and cerebral infarction without residual deficits: Secondary | ICD-10-CM | POA: Diagnosis not present

## 2022-01-09 DIAGNOSIS — H3530 Unspecified macular degeneration: Secondary | ICD-10-CM | POA: Diagnosis not present

## 2022-01-09 DIAGNOSIS — E1122 Type 2 diabetes mellitus with diabetic chronic kidney disease: Secondary | ICD-10-CM | POA: Diagnosis not present

## 2022-01-09 DIAGNOSIS — Z9181 History of falling: Secondary | ICD-10-CM | POA: Diagnosis not present

## 2022-01-09 DIAGNOSIS — E042 Nontoxic multinodular goiter: Secondary | ICD-10-CM | POA: Diagnosis not present

## 2022-01-09 DIAGNOSIS — W19XXXD Unspecified fall, subsequent encounter: Secondary | ICD-10-CM | POA: Diagnosis not present

## 2022-01-09 DIAGNOSIS — J4489 Other specified chronic obstructive pulmonary disease: Secondary | ICD-10-CM | POA: Diagnosis not present

## 2022-01-09 DIAGNOSIS — F339 Major depressive disorder, recurrent, unspecified: Secondary | ICD-10-CM | POA: Diagnosis not present

## 2022-01-10 DIAGNOSIS — E1122 Type 2 diabetes mellitus with diabetic chronic kidney disease: Secondary | ICD-10-CM | POA: Diagnosis not present

## 2022-01-13 DIAGNOSIS — E042 Nontoxic multinodular goiter: Secondary | ICD-10-CM | POA: Diagnosis not present

## 2022-01-13 DIAGNOSIS — H3530 Unspecified macular degeneration: Secondary | ICD-10-CM | POA: Diagnosis not present

## 2022-01-13 DIAGNOSIS — E1122 Type 2 diabetes mellitus with diabetic chronic kidney disease: Secondary | ICD-10-CM | POA: Diagnosis not present

## 2022-01-13 DIAGNOSIS — I129 Hypertensive chronic kidney disease with stage 1 through stage 4 chronic kidney disease, or unspecified chronic kidney disease: Secondary | ICD-10-CM | POA: Diagnosis not present

## 2022-01-13 DIAGNOSIS — Z9181 History of falling: Secondary | ICD-10-CM | POA: Diagnosis not present

## 2022-01-13 DIAGNOSIS — S8012XD Contusion of left lower leg, subsequent encounter: Secondary | ICD-10-CM | POA: Diagnosis not present

## 2022-01-13 DIAGNOSIS — N183 Chronic kidney disease, stage 3 unspecified: Secondary | ICD-10-CM | POA: Diagnosis not present

## 2022-01-13 DIAGNOSIS — Z8673 Personal history of transient ischemic attack (TIA), and cerebral infarction without residual deficits: Secondary | ICD-10-CM | POA: Diagnosis not present

## 2022-01-13 DIAGNOSIS — W19XXXD Unspecified fall, subsequent encounter: Secondary | ICD-10-CM | POA: Diagnosis not present

## 2022-01-13 DIAGNOSIS — J4489 Other specified chronic obstructive pulmonary disease: Secondary | ICD-10-CM | POA: Diagnosis not present

## 2022-01-13 DIAGNOSIS — Z794 Long term (current) use of insulin: Secondary | ICD-10-CM | POA: Diagnosis not present

## 2022-01-13 DIAGNOSIS — F339 Major depressive disorder, recurrent, unspecified: Secondary | ICD-10-CM | POA: Diagnosis not present

## 2022-01-13 DIAGNOSIS — E785 Hyperlipidemia, unspecified: Secondary | ICD-10-CM | POA: Diagnosis not present

## 2022-01-13 DIAGNOSIS — M1389 Other specified arthritis, multiple sites: Secondary | ICD-10-CM | POA: Diagnosis not present

## 2022-01-13 DIAGNOSIS — E1169 Type 2 diabetes mellitus with other specified complication: Secondary | ICD-10-CM | POA: Diagnosis not present

## 2022-01-16 ENCOUNTER — Other Ambulatory Visit: Payer: Self-pay | Admitting: Family Medicine

## 2022-01-17 IMAGING — DX DG CHEST 1V PORT
1 series · 1 of 1 positions shown · non-contrast
Comparison: 05/01/2019

CLINICAL DATA: Fever and decreased appetite. Asthma/COPD. Diabetes.
Ex-smoker.

EXAM:
PORTABLE CHEST 1 VIEW

[chest ap]
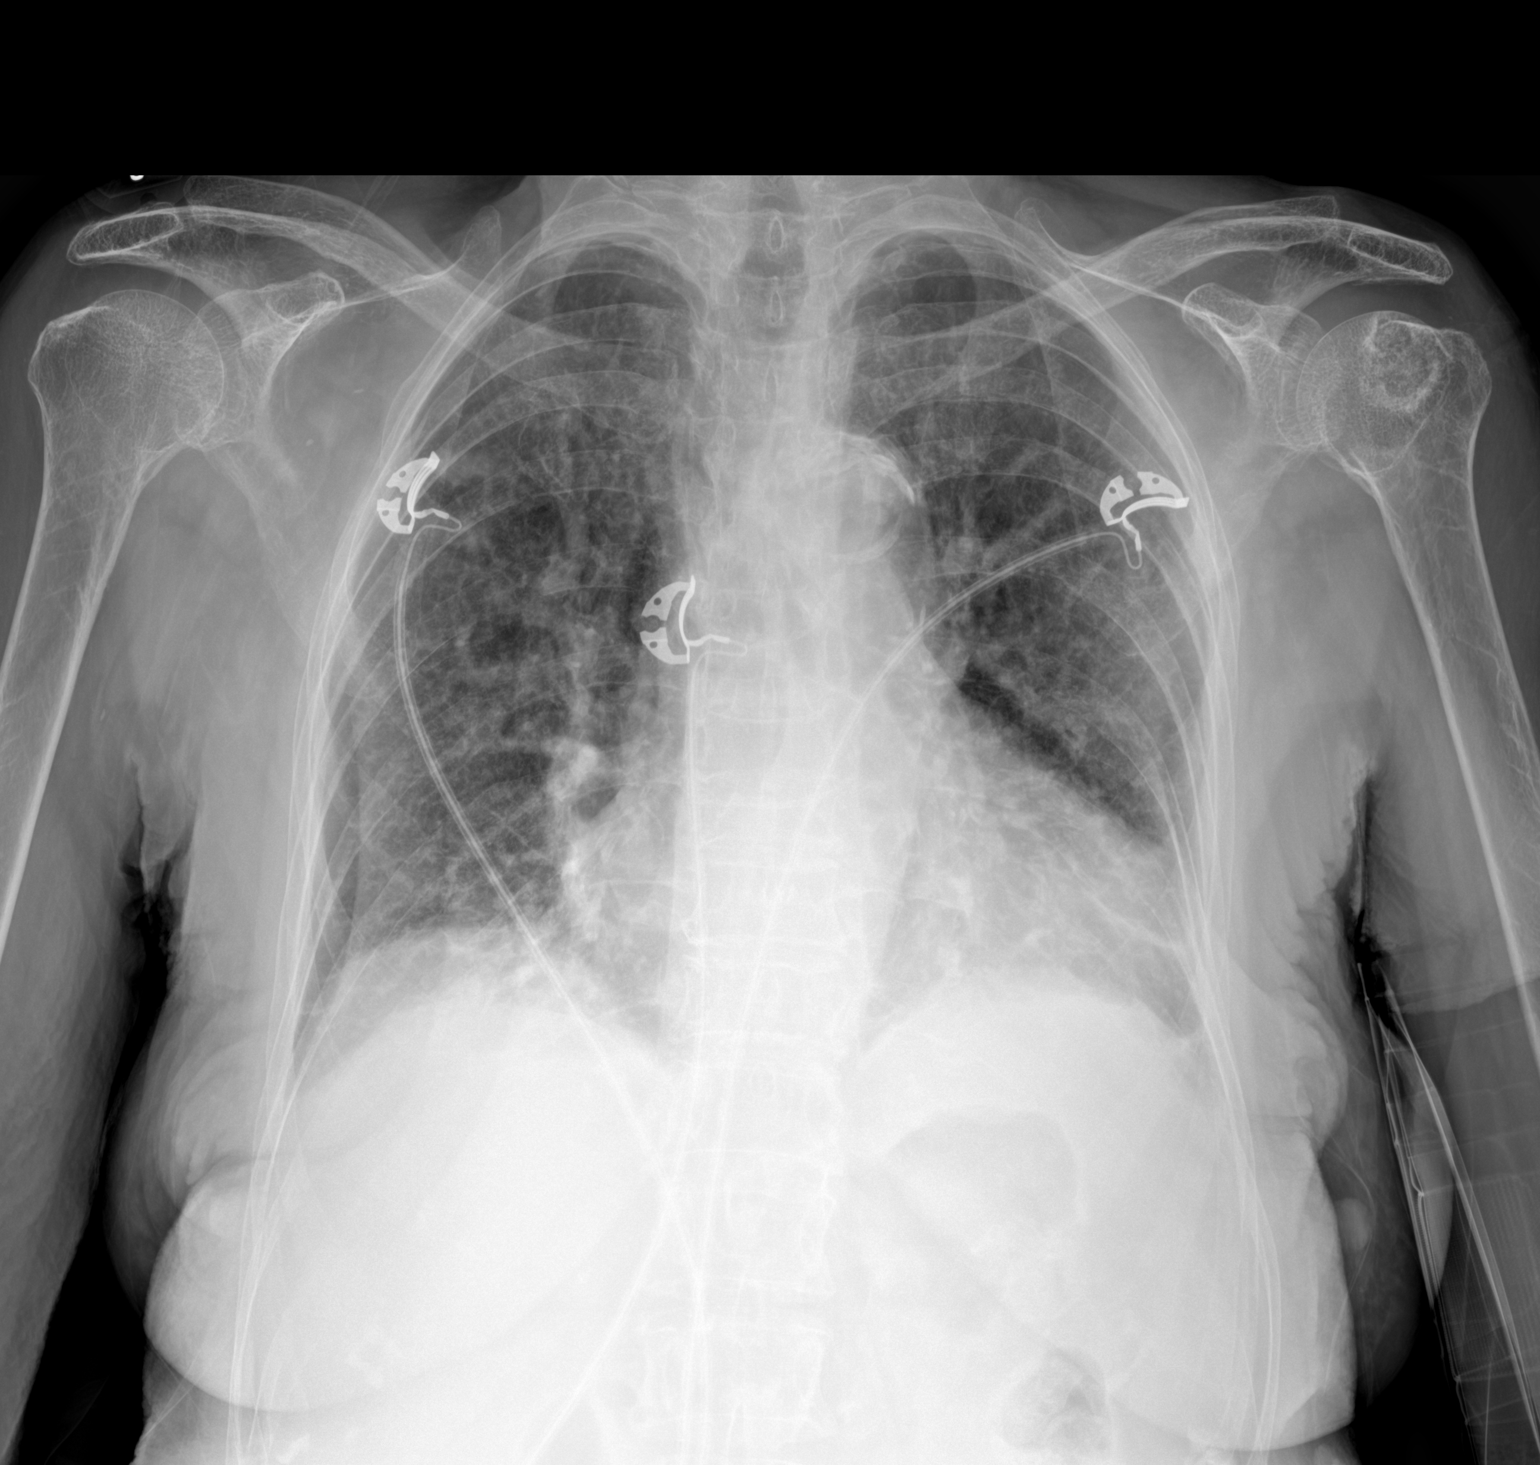

[1 of 1 positions shown; findings below may reference images not displayed]

FINDINGS: Numerous leads and wires project over the chest. Probable bone
infarct in the left humeral head, similar. Midline trachea. Mild
cardiomegaly. Atherosclerosis in the transverse aorta. Tortuous
thoracic aorta. No pleural effusion or pneumothorax. Skin fold over
the right hemithorax laterally. Chronic interstitial coarsening.
Left base scarring.
IMPRESSION: No interval change or acute process compared to 05/01/2019.

Cardiomegaly with chronic interstitial coarsening consistent with
COPD/chronic bronchitis.

Aortic Atherosclerosis ([id number]).

Cardiomegaly.

## 2022-01-18 DIAGNOSIS — E113313 Type 2 diabetes mellitus with moderate nonproliferative diabetic retinopathy with macular edema, bilateral: Secondary | ICD-10-CM | POA: Diagnosis not present

## 2022-01-18 DIAGNOSIS — N1831 Chronic kidney disease, stage 3a: Secondary | ICD-10-CM | POA: Diagnosis not present

## 2022-01-18 DIAGNOSIS — Z794 Long term (current) use of insulin: Secondary | ICD-10-CM | POA: Diagnosis not present

## 2022-01-18 DIAGNOSIS — E1122 Type 2 diabetes mellitus with diabetic chronic kidney disease: Secondary | ICD-10-CM | POA: Diagnosis not present

## 2022-01-18 DIAGNOSIS — R809 Proteinuria, unspecified: Secondary | ICD-10-CM | POA: Diagnosis not present

## 2022-01-18 DIAGNOSIS — M810 Age-related osteoporosis without current pathological fracture: Secondary | ICD-10-CM | POA: Diagnosis not present

## 2022-01-18 DIAGNOSIS — E1129 Type 2 diabetes mellitus with other diabetic kidney complication: Secondary | ICD-10-CM | POA: Diagnosis not present

## 2022-01-18 NOTE — Telephone Encounter (Signed)
Requested Prescriptions  Pending Prescriptions Disp Refills   GNP NATURAL FIBER 0.52 g capsule [Pharmacy Med Name: [place] 0.52 GM CAP] 90 capsule 0    Sig: TAKE 1 CAPSULE BY MOUTH ONCE DAILY AT 12 NOON     Over the Counter:  OTC Passed - 01/16/2022 10:56 AM      Passed - Valid encounter within last 12 months    Recent Outpatient Visits           3 weeks ago Left leg pain   [place], Megan P, DO   3 months ago Need for influenza vaccination   [facility] [place], Megan P, DO   7 months ago Type 2 diabetes mellitus with stage 3 chronic kidney disease, with long-term current use of insulin, unspecified whether stage 3a or 3b CKD ([place])   [facility] Johnson, Megan P, DO   11 months ago Type 2 diabetes mellitus with stage 3 chronic kidney disease, with long-term current use of insulin, unspecified whether stage 3a or 3b CKD ([place])   [place], [place], DO   1 year ago Need for immunization against influenza   [facility] [place], Barb Merino, DO       Future Appointments             Tomorrow Valerie Roys, DO [place], [facility]   In 2 weeks Johnson, Megan P, DO [place], [facility]             sertraline (ZOLOFT) 50 MG tablet [Pharmacy Med Name: SERTRALINE HCL 50 MG TAB] 135 tablet 1    Sig: TAKE 1.5 TABLETS (75 MG TOTAL) BY MOUTH DAILY.     Psychiatry:  Antidepressants - SSRI - sertraline Passed - 01/16/2022 10:56 AM      Passed - AST in normal range and within 360 days    AST  Date Value Ref Range Status  06/04/2021 29 0 - 40 IU/L Final         Passed - ALT in normal range and within 360 days    ALT  Date Value Ref Range Status  06/04/2021 31 0 - 32 IU/L Final         Passed - Completed PHQ-2 or PHQ-9 in the last 360 days      Passed - Valid encounter within last 6 months    Recent Outpatient Visits           3 weeks ago Left leg pain   [place], Megan P, DO   3 months ago Need for influenza vaccination   [facility] [place], Megan P, DO   7 months ago Type 2 diabetes mellitus with stage 3 chronic kidney disease, with long-term current use of insulin, unspecified whether stage 3a or 3b CKD ([place])   [place], Megan P, DO   11 months ago Type 2 diabetes mellitus with stage 3 chronic kidney disease, with long-term current use of insulin, unspecified whether stage 3a or 3b CKD ([place])   [place], Megan P, DO   1 year ago Need for immunization against influenza   [place], Barb Merino, DO       Future Appointments             Tomorrow Valerie Roys, DO [place], [place]   In 2 weeks Wynetta Emery, Barb Merino, DO [employer], [facility]

## 2022-01-18 NOTE — Telephone Encounter (Signed)
Unable to refill per protocol, Rx request is too soon. Last refill 10/05/21 for 90 and 1 refill.  Requested Prescriptions  Pending Prescriptions Disp Refills   sertraline (ZOLOFT) 50 MG tablet [Pharmacy Med Name: SERTRALINE HCL 50 MG TAB] 135 tablet 1    Sig: TAKE 1.5 TABLETS (75 MG TOTAL) BY MOUTH DAILY.     Psychiatry:  Antidepressants - SSRI - sertraline Passed - 01/16/2022 10:56 AM      Passed - AST in normal range and within 360 days    AST  Date Value Ref Range Status  06/04/2021 29 0 - 40 IU/L Final         Passed - ALT in normal range and within 360 days    ALT  Date Value Ref Range Status  06/04/2021 31 0 - 32 IU/L Final         Passed - Completed PHQ-2 or PHQ-9 in the last 360 days      Passed - Valid encounter within last 6 months    Recent Outpatient Visits           3 weeks ago Left leg pain   [place], Megan P, DO   3 months ago Need for influenza vaccination   [facility] [place], Megan P, DO   7 months ago Type 2 diabetes mellitus with stage 3 chronic kidney disease, with long-term current use of insulin, unspecified whether stage 3a or 3b CKD ([place])   [place], Megan P, DO   11 months ago Type 2 diabetes mellitus with stage 3 chronic kidney disease, with long-term current use of insulin, unspecified whether stage 3a or 3b CKD ([place])   [place], [place], DO   1 year ago Need for immunization against influenza   [place], [place], DO       Future Appointments             Tomorrow Valerie Roys, DO [place], [facility]   In 2 weeks [place], Megan P, DO [employer], [facility]            Signed Prescriptions Disp Refills   GNP NATURAL FIBER 0.52 g capsule 90 capsule 0    Sig: TAKE 1 CAPSULE BY MOUTH ONCE DAILY AT 12 NOON     Over the Counter:  OTC Passed - 01/16/2022 10:56 AM      Passed - Valid encounter within last 12 months     Recent Outpatient Visits           3 weeks ago Left leg pain   [place], Megan P, DO   3 months ago Need for influenza vaccination   [facility] [place], Megan P, DO   7 months ago Type 2 diabetes mellitus with stage 3 chronic kidney disease, with long-term current use of insulin, unspecified whether stage 3a or 3b CKD ([place])   [place], Megan P, DO   11 months ago Type 2 diabetes mellitus with stage 3 chronic kidney disease, with long-term current use of insulin, unspecified whether stage 3a or 3b CKD ([place])   [place], Megan P, DO   1 year ago Need for immunization against influenza   [place], Barb Merino, DO       Future Appointments             Tomorrow Valerie Roys, DO [facility],  [facility]   In 2 weeks Wynetta Emery, Barb Merino, DO [employer], [facility]

## 2022-01-19 ENCOUNTER — Other Ambulatory Visit: Payer: Self-pay | Admitting: Family Medicine

## 2022-01-19 ENCOUNTER — Encounter: Payer: Self-pay | Admitting: Family Medicine

## 2022-01-19 ENCOUNTER — Ambulatory Visit (INDEPENDENT_AMBULATORY_CARE_PROVIDER_SITE_OTHER): Payer: PPO | Admitting: Family Medicine

## 2022-01-19 VITALS — BP 130/76 | HR 80 | Temp 97.5°F | Ht 59.0 in | Wt 92.4 lb

## 2022-01-19 DIAGNOSIS — I129 Hypertensive chronic kidney disease with stage 1 through stage 4 chronic kidney disease, or unspecified chronic kidney disease: Secondary | ICD-10-CM

## 2022-01-19 DIAGNOSIS — R630 Anorexia: Secondary | ICD-10-CM | POA: Insufficient documentation

## 2022-01-19 DIAGNOSIS — M79605 Pain in left leg: Secondary | ICD-10-CM | POA: Diagnosis not present

## 2022-01-19 DIAGNOSIS — J449 Chronic obstructive pulmonary disease, unspecified: Secondary | ICD-10-CM | POA: Diagnosis not present

## 2022-01-19 DIAGNOSIS — H3530 Unspecified macular degeneration: Secondary | ICD-10-CM | POA: Diagnosis not present

## 2022-01-19 DIAGNOSIS — W19XXXD Unspecified fall, subsequent encounter: Secondary | ICD-10-CM | POA: Diagnosis not present

## 2022-01-19 DIAGNOSIS — E1122 Type 2 diabetes mellitus with diabetic chronic kidney disease: Secondary | ICD-10-CM | POA: Diagnosis not present

## 2022-01-19 DIAGNOSIS — Z794 Long term (current) use of insulin: Secondary | ICD-10-CM | POA: Diagnosis not present

## 2022-01-19 DIAGNOSIS — Z0000 Encounter for general adult medical examination without abnormal findings: Secondary | ICD-10-CM

## 2022-01-19 DIAGNOSIS — E785 Hyperlipidemia, unspecified: Secondary | ICD-10-CM

## 2022-01-19 DIAGNOSIS — N183 Chronic kidney disease, stage 3 unspecified: Secondary | ICD-10-CM | POA: Diagnosis not present

## 2022-01-19 DIAGNOSIS — E1169 Type 2 diabetes mellitus with other specified complication: Secondary | ICD-10-CM | POA: Diagnosis not present

## 2022-01-19 MED ORDER — MIRTAZAPINE 30 MG PO TABS: 30.0000 mg | ORAL_TABLET | Freq: Every day | ORAL | 1 refills | Status: DC

## 2022-01-19 NOTE — Assessment & Plan Note (Signed)
Stable. Continue to follow with opthalmology.

## 2022-01-19 NOTE — Patient Instructions (Signed)
Preventative Services:  Health Risk Assessment and Personalized Prevention Plan: Done today Bone Mass Measurements: Scheduled for February Breast Cancer Screening: N/A CVD Screening: Up to date Cervical Cancer Screening: N/A Colon Cancer Screening: N/A Depression Screening: Done today Diabetes Screening: Done today Glaucoma Screening: See your eye doctor Hepatitis B vaccine: N/A Hepatitis C screening: Up to date HIV Screening: up to date Flu Vaccine: up to date Lung cancer Screening: N/A Obesity Screening: Done today Pneumonia Vaccines (2): up to date STI Screening: N/A

## 2022-01-19 NOTE — Progress Notes (Signed)
BP 130/76   Pulse 80   Temp (!) 97.5 F (36.4 C) (Oral)   Ht '4\' 11"'$  (1.499 m)   Wt 92 lb 6.4 oz (41.9 kg)   SpO2 99%   BMI 18.66 kg/m    Subjective:    Patient ID: Angela Neal, female    DOB: [birth year], 89 y.o.   MRN: [id number]  HPI: Angela Neal is a 89 y.o. female presenting on 01/19/2022 for comprehensive medical examination. Current medical complaints include:  Home health has been out. Feeling better. Has been doing some PT at home. Has been able to walk more since last time. Leg is feeling much better. Has forgotten to use her walker a bit more. Has been having more problems with her ADLs. She has not been changing her underwear and having more accidents. Feels like her leg has improved. Not stuck in the chair anymore like she was.   Saw endocrinology yesterday and everything is stable. No changes. No other concerns or complaints at this time.   Menopausal Symptoms: no  Functional Status Survey: Is the patient deaf or have difficulty hearing?: Yes Does the patient have difficulty seeing, even when wearing glasses/contacts?: Yes Does the patient have difficulty concentrating, remembering, or making decisions?: Yes Does the patient have difficulty walking or climbing stairs?: Yes Does the patient have difficulty dressing or bathing?: Yes Does the patient have difficulty doing errands alone such as visiting a doctor's office or shopping?: Yes     01/19/2022    1:12 PM 12/22/2021    1:13 PM 10/05/2021    1:24 PM 01/27/2021    9:51 AM 07/02/2020   10:55 AM  Fall Risk   Falls in the past year? 1 1 0 1 1  Number falls in past yr: 0 0 0 1 1  Injury with Fall? 0 0 0 1 1  Risk for fall due to : History of fall(s) History of fall(s) No Fall Risks Impaired balance/gait;Impaired mobility History of fall(s);Impaired balance/gait  Follow up Falls evaluation completed Falls evaluation completed Falls evaluation completed Falls evaluation completed Falls evaluation completed     Depression Screen    01/19/2022    1:11 PM 12/22/2021    1:14 PM 10/05/2021    1:24 PM 06/04/2021    2:12 PM 01/27/2021    9:51 AM  Depression screen PHQ 2/9  Decreased Interest 0 3 2 0 1  Down, Depressed, Hopeless 0 3 0 0 1  PHQ - 2 Score 0 6 2 0 2  Altered sleeping 0 2 1 0 1  Tired, decreased energy 0 1 1 0 1  Change in appetite '1 1 1 '$ 0 3  Feeling bad or failure about yourself  0 2 0 0 0  Trouble concentrating 0 0 3 0 3  Moving slowly or fidgety/restless 0 2 1 0 1  Suicidal thoughts 0 2 0 0 0  PHQ-9 Score '1 16 9 '$ 0 11  Difficult doing work/chores Very difficult Extremely dIfficult   Somewhat difficult     Advanced Directives Does patient have a HCPOA?    yes If yes, name and contact information: Daughter- on file Does patient have a living will or MOST form?  yes  Past Medical History:  Past Medical History:  Diagnosis Date   Asthma    COPD (chronic obstructive pulmonary disease) ([place])    Diabetes mellitus without complication ([place])    Hypertension    Macular degeneration    Stroke ([place])  Surgical History:  Past Surgical History:  Procedure Laterality Date   ABDOMINAL HYSTERECTOMY     HIP ARTHROPLASTY      Medications:  Current Outpatient Medications on File Prior to Visit  Medication Sig   amLODipine (NORVASC) 5 MG tablet Take 1 tablet (5 mg total) by mouth daily. TAKE 1 TABLET BY MOUTH ONCE EVERY DAY   aspirin 81 MG EC tablet Take 81 mg by mouth daily.   atorvastatin (LIPITOR) 40 MG tablet TAKE 1 TABLET BY MOUTH ONCE EVERY DAY   GNP NATURAL FIBER 0.52 g capsule TAKE 1 CAPSULE BY MOUTH ONCE DAILY AT 12 NOON   GNP VITAMIN D3 EXTRA STRENGTH 25 MCG (1000 UT) tablet TAKE 1 TABLET BY MOUTH ONCE EVERY DAY FOR SUPPLEMENT   JANUVIA 100 MG tablet Take 1 tablet (100 mg total) by mouth daily.   LEVEMIR FLEXTOUCH 100 UNIT/ML FlexTouch Pen Inject 7 Units into the skin at bedtime.   lidocaine (LIDODERM) 5 % Place 1 patch onto the skin daily. Remove & Discard patch  within 12 hours or as directed by MD   losartan (COZAAR) 100 MG tablet Take 1 tablet (100 mg total) by mouth daily.   montelukast (SINGULAIR) 10 MG tablet Take 1 tablet (10 mg total) by mouth at bedtime. TAKE 1 TABLET(10 MG) BY MOUTH NIGHTLY   Multiple Vitamins-Minerals (GNP HEALTHY EYES SUPERVISION 2) CAPS TAKE 1 CAPSULE BY MOUTH 2 TIMES DAILY   naproxen (NAPROSYN) 500 MG tablet Take 1 tablet (500 mg total) by mouth 2 (two) times daily with a meal.   NOVOLOG FLEXPEN 100 UNIT/ML FlexPen Inject 4 Units into the skin 3 (three) times daily before meals. (plus up to 5u sliding scale correction)   sertraline (ZOLOFT) 50 MG tablet Take 1.5 tablets (75 mg total) by mouth daily. TAKE 1.5 TABLETS (75 MG TOTAL) BY MOUTH ONCE DAILY   TRUE METRIX BLOOD GLUCOSE TEST test strip TEST BLOOD SUGAR 4 TIMES DAILY BEFORE LEVEMIR IS GIVEN OR AS DIRECTED BY PHYSICIAN   TRUEplus Safety Lancets 28G MISC USE FOR FINGER STICK BLOOD SUGARS (CURRENTLY 4 TIMES DAILY)   ULTRACARE PEN NEEDLES 33G X 4 MM MISC USE WITH INSULIN PENS AS DIRECTED BY PHYSICIAN (CURRENTLY 4 TIMES DAILY)   No current facility-administered medications on file prior to visit.    Allergies:  Allergies  Allergen Reactions   Sulfa Antibiotics Anaphylaxis    Social History:  Social History   Socioeconomic History   Marital status: Widowed    Spouse name: Not on file   Number of children: Not on file   Years of education: Not on file   Highest education level: Not on file  Occupational History   Not on file  Tobacco Use   Smoking status: Former   Smokeless tobacco: Never  Vaping Use   Vaping Use: Never used  Substance and Sexual Activity   Alcohol use: Never   Drug use: Never   Sexual activity: Not Currently  Other Topics Concern   Not on file  Social History Narrative   Not on file   Social Determinants of Health   Financial Resource Strain: Not on file  Food Insecurity: Not on file  Transportation Needs: Not on file  Physical  Activity: Not on file  Stress: Not on file  Social Connections: Not on file  Intimate Partner Violence: Not on file   Social History   Tobacco Use  Smoking Status Former  Smokeless Tobacco Never   Social History   Substance and  Sexual Activity  Alcohol Use Never    Family History:  Family History  Problem Relation Age of Onset   Arthritis Mother    Heart disease Mother     Past medical history, surgical history, medications, allergies, family history and social history reviewed with patient today and changes made to appropriate areas of the chart.   Review of Systems  Constitutional: Negative.   HENT: Negative.    Respiratory: Negative.    Cardiovascular: Negative.   Musculoskeletal:  Positive for falls and myalgias. Negative for back pain, joint pain and neck pain.  Skin: Negative.   Neurological: Negative.   Psychiatric/Behavioral: Negative.      All other ROS negative except what is listed above and in the HPI.      Objective:    BP 130/76   Pulse 80   Temp (!) 97.5 F (36.4 C) (Oral)   Ht '4\' 11"'$  (1.499 m)   Wt 92 lb 6.4 oz (41.9 kg)   SpO2 99%   BMI 18.66 kg/m   Wt Readings from Last 3 Encounters:  01/19/22 92 lb 6.4 oz (41.9 kg)  12/09/21 95 lb (43.1 kg)  10/05/21 94 lb 6.4 oz (42.8 kg)    Physical Exam Vitals and nursing note reviewed.  Constitutional:      General: She is not in acute distress.    Appearance: Normal appearance. She is normal weight. She is not ill-appearing, toxic-appearing or diaphoretic.  HENT:     Head: Normocephalic and atraumatic.     Right Ear: External ear normal.     Left Ear: External ear normal.     Nose: Nose normal.     Mouth/Throat:     Mouth: Mucous membranes are moist.     Pharynx: Oropharynx is clear.  Eyes:     General: No scleral icterus.       Right eye: No discharge.        Left eye: No discharge.     Extraocular Movements: Extraocular movements intact.     Conjunctiva/sclera: Conjunctivae normal.      Pupils: Pupils are equal, round, and reactive to light.  Cardiovascular:     Rate and Rhythm: Normal rate and regular rhythm.     Pulses: Normal pulses.     Heart sounds: Normal heart sounds. No murmur heard.    No friction rub. No gallop.  Pulmonary:     Effort: Pulmonary effort is normal. No respiratory distress.     Breath sounds: Normal breath sounds. No stridor. No wheezing, rhonchi or rales.  Chest:     Chest wall: No tenderness.  Musculoskeletal:        General: Normal range of motion.     Cervical back: Normal range of motion and neck supple.  Skin:    General: Skin is warm and dry.     Capillary Refill: Capillary refill takes less than 2 seconds.     Coloration: Skin is not jaundiced or pale.     Findings: No bruising, erythema, lesion or rash.  Neurological:     General: No focal deficit present.     Mental Status: She is alert and oriented to person, place, and time. Mental status is at baseline.  Psychiatric:        Mood and Affect: Mood normal.        Behavior: Behavior normal.        Thought Content: Thought content normal.        Judgment: Judgment normal.  01/19/2022    1:32 PM  6CIT Screen  What Year? 4 points  What month? 3 points  What time? 3 points  Count back from 20 4 points  Months in reverse 2 points  Repeat phrase 10 points  Total Score 26 points    Results for orders placed or performed in visit on 12/22/21  Microscopic Examination   Urine  Result Value Ref Range   WBC, UA 0-5 0 - 5 /hpf   RBC, Urine None seen 0 - 2 /hpf   Epithelial Cells (non renal) 0-10 0 - 10 /hpf   Bacteria, UA None seen None seen/Few  Urinalysis, Routine w reflex microscopic  Result Value Ref Range   Specific Gravity, UA 1.015 1.005 - 1.030   pH, UA 5.0 5.0 - 7.5   Color, UA Yellow Yellow   Appearance Ur Clear Clear   Leukocytes,UA Negative Negative   Protein,UA 1+ (A) Negative/Trace   Glucose, UA 2+ (A) Negative   Ketones, UA 2+ (A) Negative   RBC,  UA Negative Negative   Bilirubin, UA Negative Negative   Urobilinogen, Ur 1.0 0.2 - 1.0 mg/dL   Nitrite, UA Negative Negative   Microscopic Examination See below:       Assessment & Plan:   Problem List Items Addressed This Visit       Respiratory   COPD (chronic obstructive pulmonary disease) ([place])     Endocrine   Type 2 diabetes mellitus with renal complication (HCC)    Stable. Continue to follow with endocrine. Call with any concerns.       Hyperlipidemia associated with type 2 diabetes mellitus (HCC)    Stable. Continue to monitor.         Genitourinary   Benign hypertensive renal disease    Under good control on current regimen. Continue current regimen. Continue to monitor. Call with any concerns.         Other   Macular degeneration    Stable. Continue to follow with opthalmology.      Decreased appetite    Will increase her remeron to '30mg'$  and recheck in 3 months. Call with any concerns.       Other Visit Diagnoses     Encounter for annual wellness exam in Medicare patient    -  Primary   Preventative care discussed today as below.   Left leg pain       Improving. Continue PT. Call with any concerns.   Fall, subsequent encounter       Balance improving with PT. Continue PT. Call with any concerns.        Preventative Services:  Health Risk Assessment and Personalized Prevention Plan: Done today Bone Mass Measurements: Scheduled for February Breast Cancer Screening: N/A CVD Screening: Up to date Cervical Cancer Screening: N/A Colon Cancer Screening: N/A Depression Screening: Done today Diabetes Screening: Done today Glaucoma Screening: See your eye doctor Hepatitis B vaccine: N/A Hepatitis C screening: Up to date HIV Screening: up to date Flu Vaccine: up to date Lung cancer Screening: N/A Obesity Screening: Done today Pneumonia Vaccines (2): up to date STI Screening: N/A  Follow up plan: Return in about 3 months (around  04/20/2022).   LABORATORY TESTING:  - Pap smear: not applicable  IMMUNIZATIONS:   - Tdap: Tetanus vaccination status reviewed: Declined. - Influenza: Up to date - Pneumovax: Up to date - Prevnar: Up to date - Zostavax vaccine: Refused  SCREENING: -Mammogram: Not applicable  - Colonoscopy: Not applicable  -  Bone Density: Up to date   PATIENT COUNSELING:   Advised to take 1 mg of folate supplement per day if capable of pregnancy.   Sexuality: Discussed sexually transmitted diseases, partner selection, use of condoms, avoidance of unintended pregnancy  and contraceptive alternatives.   Advised to avoid cigarette smoking.  I discussed with the patient that most people either abstain from alcohol or drink within safe limits (<=14/week and <=4 drinks/occasion for males, <=7/weeks and <= 3 drinks/occasion for females) and that the risk for alcohol disorders and other health effects rises proportionally with the number of drinks per week and how often a drinker exceeds daily limits.  Discussed cessation/primary prevention of drug use and availability of treatment for abuse.   Diet: Encouraged to adjust caloric intake to maintain  or achieve ideal body weight, to reduce intake of dietary saturated fat and total fat, to limit sodium intake by avoiding high sodium foods and not adding table salt, and to maintain adequate dietary potassium and calcium preferably from fresh fruits, vegetables, and low-fat dairy products.    stressed the importance of regular exercise  Injury prevention: Discussed safety belts, safety helmets, smoke detector, smoking near bedding or upholstery.   Dental health: Discussed importance of regular tooth brushing, flossing, and dental visits.    NEXT PREVENTATIVE PHYSICAL DUE IN 1 YEAR. Return in about 3 months (around 04/20/2022).

## 2022-01-19 NOTE — Assessment & Plan Note (Signed)
Stable. Continue to follow with endocrine. Call with any concerns.

## 2022-01-19 NOTE — Assessment & Plan Note (Signed)
Will increase her remeron to '30mg'$  and recheck in 3 months. Call with any concerns.

## 2022-01-19 NOTE — Assessment & Plan Note (Signed)
Stable. Continue to monitor.  

## 2022-01-19 NOTE — Progress Notes (Signed)
   BP 130/76   Pulse 80   Temp (!) 97.5 F (36.4 C) (Oral)   Ht '4\' 11"'$  (1.499 m)   Wt 92 lb 6.4 oz (41.9 kg)   SpO2 99%   BMI 18.66 kg/m    Subjective:    Patient ID: Angela Neal, female    DOB: November 10, 1921, 89 y.o.   MRN: [id number]  HPI: Angela Neal is a 89 y.o. female  No chief complaint on file.  Home health has been out. Feeling better. Has been doing some PT at home. Has been able to walk more since last time. Leg is feeling much better. Has forgotten to use her walker a bit more. Has been having more problems with her ADLs. She has not been changing her underwear  Saw endocrinology yesterday and everything is stable. No changes.   Relevant past medical, surgical, family and social history reviewed and updated as indicated. Interim medical history since our last visit reviewed. Allergies and medications reviewed and updated.  Review of Systems  Per HPI unless specifically indicated above     Objective:    BP 130/76   Pulse 80   Temp (!) 97.5 F (36.4 C) (Oral)   Ht '4\' 11"'$  (1.499 m)   Wt 92 lb 6.4 oz (41.9 kg)   SpO2 99%   BMI 18.66 kg/m   Wt Readings from Last 3 Encounters:  01/19/22 92 lb 6.4 oz (41.9 kg)  12/09/21 95 lb (43.1 kg)  10/05/21 94 lb 6.4 oz (42.8 kg)    Physical Exam  Results for orders placed or performed in visit on 12/22/21  Microscopic Examination   Urine  Result Value Ref Range   WBC, UA 0-5 0 - 5 /hpf   RBC, Urine None seen 0 - 2 /hpf   Epithelial Cells (non renal) 0-10 0 - 10 /hpf   Bacteria, UA None seen None seen/Few  Urinalysis, Routine w reflex microscopic  Result Value Ref Range   Specific Gravity, UA 1.015 1.005 - 1.030   pH, UA 5.0 5.0 - 7.5   Color, UA Yellow Yellow   Appearance Ur Clear Clear   Leukocytes,UA Negative Negative   Protein,UA 1+ (A) Negative/Trace   Glucose, UA 2+ (A) Negative   Ketones, UA 2+ (A) Negative   RBC, UA Negative Negative   Bilirubin, UA Negative Negative   Urobilinogen, Ur 1.0 0.2  - 1.0 mg/dL   Nitrite, UA Negative Negative   Microscopic Examination See below:       Assessment & Plan:   Problem List Items Addressed This Visit   None    Follow up plan: No follow-ups on file.

## 2022-01-19 NOTE — Assessment & Plan Note (Signed)
Under good control on current regimen. Continue current regimen. Continue to monitor. Call with any concerns.   

## 2022-01-20 NOTE — Telephone Encounter (Signed)
Unable to refill per protocol, Rx request is too soon. Last refill 10/05/21 for 90 days and 1 refill. Will refuse duplicate request.  Requested Prescriptions  Pending Prescriptions Disp Refills   sertraline (ZOLOFT) 50 MG tablet [Pharmacy Med Name: SERTRALINE HCL 50 MG TAB] 135 tablet 1    Sig: TAKE 1.5 TABLETS (75 MG TOTAL) BY MOUTH DAILY.     Psychiatry:  Antidepressants - SSRI - sertraline Passed - 01/19/2022  3:58 PM      Passed - AST in normal range and within 360 days    AST  Date Value Ref Range Status  06/04/2021 29 0 - 40 IU/L Final         Passed - ALT in normal range and within 360 days    ALT  Date Value Ref Range Status  06/04/2021 31 0 - 32 IU/L Final         Passed - Completed PHQ-2 or PHQ-9 in the last 360 days      Passed - Valid encounter within last 6 months    Recent Outpatient Visits           Yesterday Encounter for annual wellness exam in Medicare patient   [place], Megan P, DO   4 weeks ago Left leg pain   [place], Megan P, DO   3 months ago Need for influenza vaccination   [place], Megan P, DO   7 months ago Type 2 diabetes mellitus with stage 3 chronic kidney disease, with long-term current use of insulin, unspecified whether stage 3a or 3b CKD ([place])   [place], Megan P, DO   11 months ago Type 2 diabetes mellitus with stage 3 chronic kidney disease, with long-term current use of insulin, unspecified whether stage 3a or 3b CKD ([place])   [place], [place], DO

## 2022-02-03 ENCOUNTER — Other Ambulatory Visit: Payer: Self-pay | Admitting: Family Medicine

## 2022-02-03 NOTE — Telephone Encounter (Signed)
Requested Prescriptions  Pending Prescriptions Disp Refills   ULTRACARE PEN NEEDLES 33G X 4 MM MISC [Pharmacy Med Name: ULTRACARE PEN NEEDLES 33G X 4 MM] 100 each 0    Sig: USE WITH INSULIN PENS AS DIRECTED BY PHYSICIAN (CURRENTLY 4 TIMES DAILY)     Endocrinology: Diabetes - Testing Supplies Passed - 02/03/2022 11:53 AM      Passed - Valid encounter within last 12 months    Recent Outpatient Visits           2 weeks ago Encounter for annual wellness exam in Medicare patient   [place], [place], DO   1 month ago Left leg pain   [place] [facility] [place], [place] P, DO   4 months ago Need for influenza vaccination   [place] [facility] [place], Megan P, DO   8 months ago Type 2 diabetes mellitus with stage 3 chronic kidney disease, with long-term current use of insulin, unspecified whether stage 3a or 3b CKD ([place])   [place], Megan P, DO   1 year ago Type 2 diabetes mellitus with stage 3 chronic kidney disease, with long-term current use of insulin, unspecified whether stage 3a or 3b CKD [facility])   [place], [place], DO

## 2022-02-05 ENCOUNTER — Encounter: Payer: HMO | Admitting: Family Medicine

## 2022-02-10 DIAGNOSIS — E1122 Type 2 diabetes mellitus with diabetic chronic kidney disease: Secondary | ICD-10-CM | POA: Diagnosis not present

## 2022-02-12 DIAGNOSIS — F339 Major depressive disorder, recurrent, unspecified: Secondary | ICD-10-CM | POA: Diagnosis not present

## 2022-02-12 DIAGNOSIS — M1389 Other specified arthritis, multiple sites: Secondary | ICD-10-CM | POA: Diagnosis not present

## 2022-02-12 DIAGNOSIS — H3530 Unspecified macular degeneration: Secondary | ICD-10-CM | POA: Diagnosis not present

## 2022-02-12 DIAGNOSIS — E1122 Type 2 diabetes mellitus with diabetic chronic kidney disease: Secondary | ICD-10-CM | POA: Diagnosis not present

## 2022-02-12 DIAGNOSIS — E785 Hyperlipidemia, unspecified: Secondary | ICD-10-CM | POA: Diagnosis not present

## 2022-02-12 DIAGNOSIS — I129 Hypertensive chronic kidney disease with stage 1 through stage 4 chronic kidney disease, or unspecified chronic kidney disease: Secondary | ICD-10-CM | POA: Diagnosis not present

## 2022-02-12 DIAGNOSIS — Z9181 History of falling: Secondary | ICD-10-CM | POA: Diagnosis not present

## 2022-02-12 DIAGNOSIS — E042 Nontoxic multinodular goiter: Secondary | ICD-10-CM | POA: Diagnosis not present

## 2022-02-12 DIAGNOSIS — E1169 Type 2 diabetes mellitus with other specified complication: Secondary | ICD-10-CM | POA: Diagnosis not present

## 2022-02-12 DIAGNOSIS — J4489 Other specified chronic obstructive pulmonary disease: Secondary | ICD-10-CM | POA: Diagnosis not present

## 2022-02-12 DIAGNOSIS — N183 Chronic kidney disease, stage 3 unspecified: Secondary | ICD-10-CM | POA: Diagnosis not present

## 2022-02-12 DIAGNOSIS — S8012XD Contusion of left lower leg, subsequent encounter: Secondary | ICD-10-CM | POA: Diagnosis not present

## 2022-02-12 DIAGNOSIS — Z794 Long term (current) use of insulin: Secondary | ICD-10-CM | POA: Diagnosis not present

## 2022-02-12 DIAGNOSIS — W19XXXD Unspecified fall, subsequent encounter: Secondary | ICD-10-CM | POA: Diagnosis not present

## 2022-02-12 DIAGNOSIS — Z8673 Personal history of transient ischemic attack (TIA), and cerebral infarction without residual deficits: Secondary | ICD-10-CM | POA: Diagnosis not present

## 2022-02-22 ENCOUNTER — Other Ambulatory Visit: Payer: Self-pay | Admitting: Family Medicine

## 2022-02-23 DIAGNOSIS — M810 Age-related osteoporosis without current pathological fracture: Secondary | ICD-10-CM | POA: Diagnosis not present

## 2022-02-23 NOTE — Telephone Encounter (Signed)
Requested medication (s) are due for refill today: yes  Requested medication (s) are on the active medication list: yes  Last refill:  01/18/22 #90 0 refills  Future visit scheduled: no   Notes to clinic:  no refills remain. Do you want to refill Rx?     Requested Prescriptions  Pending Prescriptions Disp Refills   GNP NATURAL FIBER 0.52 g capsule [Pharmacy Med Name: [place] 0.52 GM CAP] 90 capsule 0    Sig: TAKE 1 CAPSULE BY MOUTH ONCE DAILY AT NOON     Over the Counter:  OTC Passed - 02/22/2022  2:08 PM      Passed - Valid encounter within last 12 months    Recent Outpatient Visits           1 month ago Encounter for annual wellness exam in Medicare patient   [place], Megan P, DO   2 months ago Left leg pain   [place], [place] P, DO   4 months ago Need for influenza vaccination   [place] [facility] [place], Megan P, DO   8 months ago Type 2 diabetes mellitus with stage 3 chronic kidney disease, with long-term current use of insulin, unspecified whether stage 3a or 3b CKD ([place])   [place], Megan P, DO   1 year ago Type 2 diabetes mellitus with stage 3 chronic kidney disease, with long-term current use of insulin, unspecified whether stage 3a or 3b CKD [facility])   [place], [place], DO

## 2022-03-11 DIAGNOSIS — E1122 Type 2 diabetes mellitus with diabetic chronic kidney disease: Secondary | ICD-10-CM | POA: Diagnosis not present

## 2022-03-19 ENCOUNTER — Other Ambulatory Visit: Payer: Self-pay | Admitting: Family Medicine

## 2022-03-19 NOTE — Telephone Encounter (Signed)
Requested Prescriptions  Pending Prescriptions Disp Refills   GNP NATURAL FIBER 0.52 g capsule [Pharmacy Med Name: [place] 0.52 GM CAP] 90 capsule 0    Sig: TAKE 1 CAPSULE BY MOUTH ONCE DAILY AT NOON     Over the Counter:  OTC Passed - 03/19/2022  2:36 PM      Passed - Valid encounter within last 12 months    Recent Outpatient Visits           1 month ago Encounter for annual wellness exam in Medicare patient   [place], Megan P, DO   2 months ago Left leg pain   [place], [place] P, DO   5 months ago Need for influenza vaccination   [place] [facility], Megan P, DO   9 months ago Type 2 diabetes mellitus with stage 3 chronic kidney disease, with long-term current use of insulin, unspecified whether stage 3a or 3b CKD ([place])   [place], Megan P, DO   1 year ago Type 2 diabetes mellitus with stage 3 chronic kidney disease, with long-term current use of insulin, unspecified whether stage 3a or 3b CKD [facility])   [place], [place], DO

## 2022-03-24 ENCOUNTER — Other Ambulatory Visit: Payer: Self-pay | Admitting: Family Medicine

## 2022-03-25 NOTE — Telephone Encounter (Signed)
Requested Prescriptions  Pending Prescriptions Disp Refills   ULTRACARE PEN NEEDLES 33G X 4 MM MISC [Pharmacy Med Name: ULTRACARE PEN NEEDLES 33G X 4 MM] 100 each 0    Sig: USE WITH INSULIN PENS AS DIRECTED BY PHYSICIAN (CURRENTLY 4 TIMES DAILY)     Endocrinology: Diabetes - Testing Supplies Passed - 03/24/2022  9:17 AM      Passed - Valid encounter within last 12 months    Recent Outpatient Visits           2 months ago Encounter for annual wellness exam in Medicare patient   [place], [place], DO   3 months ago Left leg pain   [place] [facility] [place], Megan P, DO   5 months ago Need for influenza vaccination    [facility], Megan P, DO   9 months ago Type 2 diabetes mellitus with stage 3 chronic kidney disease, with long-term current use of insulin, unspecified whether stage 3a or 3b CKD ([place])   [place], Megan P, DO   1 year ago Type 2 diabetes mellitus with stage 3 chronic kidney disease, with long-term current use of insulin, unspecified whether stage 3a or 3b CKD [facility])   [place], [place], DO

## 2022-03-30 ENCOUNTER — Telehealth: Payer: Self-pay

## 2022-03-30 NOTE — Progress Notes (Signed)
   03/30/2022  Patient ID: [name], female   DOB: 26-Oct-1921, 89 y.o.   MRN: [id number]  Patient outreach to discuss upcoming discontinuation of Elberta.  Spoke with daughter, Hoyle Sauer (designated for [facility] release), and Ms. Wisman is no longer taking Levemir.  This was stopped by endocrinology, and I have updated her medication list to reflect.  Hoyle Sauer also stated she is moving Ms. Bastien home for her to care for and stated [facility] [place] [facility] now [facility] preferred [facility].  I have updated this and instructed her to call them to obtain transfers from [facility].  She will contact Dr. Durenda Age office or myself if new prescriptions need to be sent to pharmacy.  Darlina Guys, PharmD, DPLA

## 2022-04-11 DIAGNOSIS — E1122 Type 2 diabetes mellitus with diabetic chronic kidney disease: Secondary | ICD-10-CM | POA: Diagnosis not present

## 2022-04-20 ENCOUNTER — Ambulatory Visit (INDEPENDENT_AMBULATORY_CARE_PROVIDER_SITE_OTHER): Payer: PPO | Admitting: Family Medicine

## 2022-04-20 ENCOUNTER — Encounter: Payer: Self-pay | Admitting: Family Medicine

## 2022-04-20 VITALS — BP 122/75 | HR 87 | Ht 59.0 in | Wt 94.3 lb

## 2022-04-20 DIAGNOSIS — I129 Hypertensive chronic kidney disease with stage 1 through stage 4 chronic kidney disease, or unspecified chronic kidney disease: Secondary | ICD-10-CM | POA: Diagnosis not present

## 2022-04-20 DIAGNOSIS — E1169 Type 2 diabetes mellitus with other specified complication: Secondary | ICD-10-CM | POA: Diagnosis not present

## 2022-04-20 DIAGNOSIS — F0390 Unspecified dementia without behavioral disturbance: Secondary | ICD-10-CM

## 2022-04-20 DIAGNOSIS — N183 Chronic kidney disease, stage 3 unspecified: Secondary | ICD-10-CM

## 2022-04-20 DIAGNOSIS — E785 Hyperlipidemia, unspecified: Secondary | ICD-10-CM

## 2022-04-20 DIAGNOSIS — J449 Chronic obstructive pulmonary disease, unspecified: Secondary | ICD-10-CM | POA: Diagnosis not present

## 2022-04-20 DIAGNOSIS — Z0000 Encounter for general adult medical examination without abnormal findings: Secondary | ICD-10-CM | POA: Diagnosis not present

## 2022-04-20 DIAGNOSIS — Z794 Long term (current) use of insulin: Secondary | ICD-10-CM

## 2022-04-20 DIAGNOSIS — E1122 Type 2 diabetes mellitus with diabetic chronic kidney disease: Secondary | ICD-10-CM | POA: Diagnosis not present

## 2022-04-20 DIAGNOSIS — H6123 Impacted cerumen, bilateral: Secondary | ICD-10-CM | POA: Diagnosis not present

## 2022-04-20 DIAGNOSIS — F339 Major depressive disorder, recurrent, unspecified: Secondary | ICD-10-CM | POA: Diagnosis not present

## 2022-04-20 LAB — URINALYSIS, ROUTINE W REFLEX MICROSCOPIC
Bilirubin, UA: NEGATIVE
Ketones, UA: NEGATIVE
Leukocytes,UA: NEGATIVE
Nitrite, UA: NEGATIVE
RBC, UA: NEGATIVE
Specific Gravity, UA: 1.015 (ref 1.005–1.030)
Urobilinogen, Ur: 1 mg/dL (ref 0.2–1.0)
pH, UA: 5.5 (ref 5.0–7.5)

## 2022-04-20 LAB — MICROSCOPIC EXAMINATION
Bacteria, UA: NONE SEEN
WBC, UA: NONE SEEN /hpf (ref 0–5)

## 2022-04-20 LAB — MICROALBUMIN, URINE WAIVED
Creatinine, Urine Waived: 200 mg/dL (ref 10–300)
Microalb, Ur Waived: 80 mg/L — ABNORMAL HIGH (ref 0–19)

## 2022-04-20 LAB — BAYER DCA HB A1C WAIVED: HB A1C (BAYER DCA - WAIVED): 7.7 % — ABNORMAL HIGH (ref 4.8–5.6)

## 2022-04-20 NOTE — Progress Notes (Unsigned)
BP 122/75   Pulse 87   Ht 4\' 11"  (1.499 m)   Wt 94 lb 4.8 oz (42.8 kg)   SpO2 (!) 85%   BMI 19.05 kg/m    Subjective:    Patient ID: Angela Neal, female    DOB: 1921/03/21, 89 y.o.   MRN: [id number]  HPI: Angela Neal is a 89 y.o. female presenting on 04/20/2022 for comprehensive medical examination. Current medical complaints include:  DIABETES Hypoglycemic episodes:yes Polydipsia/polyuria: {Blank single:19197::"yes","no"} Visual disturbance: {Blank single:19197::"yes","no"} Chest pain: {Blank single:19197::"yes","no"} Paresthesias: {Blank single:19197::"yes","no"} Glucose Monitoring: yes  Accucheck frequency:  continuous  Post prandial: 300s Taking Insulin?: yes Blood Pressure Monitoring: {Blank single:19197::"not checking","rarely","daily","weekly","monthly","a few times a day","a few times a week","a few times a month"} Retinal Examination: {Blank single:19197::"Up to Date","Not up to Date"} Foot Exam: {Blank single:19197::"Up to Date","Not up to Date"} Diabetic Education: {Blank single:19197::"Completed","Not Completed"} Pneumovax: {Blank single:19197::"Up to Date","Not up to Date","unknown"} Influenza: {Blank single:19197::"Up to Date","Not up to Date","unknown"} Aspirin: {Blank single:19197::"yes","no"}  HYPERTENSION / HYPERLIPIDEMIA Satisfied with current treatment? {Blank single:19197::"yes","no"} Duration of hypertension: {Blank single:19197::"chronic","months","years"} BP monitoring frequency: {Blank single:19197::"not checking","rarely","daily","weekly","monthly","a few times a day","a few times a week","a few times a month"} BP range:  BP medication side effects: {Blank single:19197::"yes","no"} Past BP meds: {Blank multiple:19196::"none","amlodipine","amlodipine/benazepril","atenolol","benazepril","benazepril/HCTZ","bisoprolol (bystolic)","carvedilol","chlorthalidone","clonidine","diltiazem","exforge HCT","HCTZ","irbesartan  (avapro)","labetalol","lisinopril","lisinopril-HCTZ","losartan (cozaar)","methyldopa","nifedipine","olmesartan (benicar)","olmesartan-HCTZ","quinapril","ramipril","spironalactone","tekturna","valsartan","valsartan-HCTZ","verapamil"} Duration of hyperlipidemia: {Blank single:19197::"chronic","months","years"} Cholesterol medication side effects: {Blank single:19197::"yes","no"} Cholesterol supplements: {Blank multiple:19196::"none","fish oil","niacin","red yeast rice"} Past cholesterol medications: {Blank multiple:19196::"none","atorvastain (lipitor)","lovastatin (mevacor)","pravastatin (pravachol)","rosuvastatin (crestor)","simvastatin (zocor)","vytorin","fenofibrate (tricor)","gemfibrozil","ezetimide (zetia)","niaspan","lovaza"} Medication compliance: {Blank single:19197::"excellent compliance","good compliance","fair compliance","poor compliance"} Aspirin: {Blank single:19197::"yes","no"} Recent stressors: {Blank single:19197::"yes","no"} Recurrent headaches: {Blank single:19197::"yes","no"} Visual changes: {Blank single:19197::"yes","no"} Palpitations: {Blank single:19197::"yes","no"} Dyspnea: {Blank single:19197::"yes","no"} Chest pain: {Blank single:19197::"yes","no"} Lower extremity edema: {Blank single:19197::"yes","no"} Dizzy/lightheaded: {Blank single:19197::"yes","no"}  DEPRESSION Mood status: {Blank single:19197::"controlled","uncontrolled","better","worse","exacerbated","stable"} Satisfied with current treatment?: {Blank single:19197::"yes","no"} Symptom severity: {Blank single:19197::"mild","moderate","severe"}  Duration of current treatment : {Blank single:19197::"chronic","months","years"} Side effects: {Blank single:19197::"yes","no"} Medication compliance: {Blank single:19197::"excellent compliance","good compliance","fair compliance","poor compliance"} Psychotherapy/counseling: {Blank single:19197::"yes","no"} {Blank single:19197::"current","in the past"} Previous  psychiatric medications: {Blank multiple:19196::"abilify","amitryptiline","buspar","celexa","cymbalta","depakote","effexor","lamictal","lexapro","lithium","nortryptiline","paxil","prozac","pristiq (desvenlafaxine","seroquel","wellbutrin","zoloft","zyprexa"} Depressed mood: {Blank single:19197::"yes","no"} Anxious mood: {Blank single:19197::"yes","no"} Anhedonia: {Blank single:19197::"yes","no"} Significant weight loss or gain: {Blank single:19197::"yes","no"} Insomnia: {Blank single:19197::"yes","no"} {Blank single:19197::"hard to fall asleep","hard to stay asleep"} Fatigue: {Blank single:19197::"yes","no"} Feelings of worthlessness or guilt: {Blank single:19197::"yes","no"} Impaired concentration/indecisiveness: {Blank single:19197::"yes","no"} Suicidal ideations: {Blank single:19197::"yes","no"} Hopelessness: {Blank single:19197::"yes","no"} Crying spells: {Blank single:19197::"yes","no"}    01/19/2022    1:11 PM 12/22/2021    1:14 PM 10/05/2021    1:24 PM 06/04/2021    2:12 PM 01/27/2021    9:51 AM  Depression screen PHQ 2/9  Decreased Interest 0 3 2 0 1  Down, Depressed, Hopeless 0 3 0 0 1  PHQ - 2 Score 0 6 2 0 2  Altered sleeping 0 2 1 0 1  Tired, decreased energy 0 1 1 0 1  Change in appetite 1 1 1  0 3  Feeling bad or failure about yourself  0 2 0 0 0  Trouble concentrating 0 0 3 0 3  Moving slowly or fidgety/restless 0 2 1 0 1  Suicidal thoughts 0 2 0 0 0  PHQ-9 Score 1 16 9  0 11  Difficult doing work/chores Very difficult Extremely dIfficult   Somewhat difficult    She currently lives with: daughter and son in law Menopausal Symptoms: no  Depression Screen done today and results listed below:     01/19/2022    1:11 PM 12/22/2021    1:14 PM 10/05/2021    1:24 PM 06/04/2021    2:12  PM 01/27/2021    9:51 AM  Depression screen PHQ 2/9  Decreased Interest 0 3 2 0 1  Down, Depressed, Hopeless 0 3 0 0 1  PHQ - 2 Score 0 6 2 0 2  Altered sleeping 0 2 1 0 1  Tired, decreased  energy 0 1 1 0 1  Change in appetite 0 3  Feeling bad or failure about yourself  0 2 0 0 0  Trouble concentrating 0 0 3 0 3  Moving slowly or fidgety/restless 0 2 1 0 1  Suicidal thoughts 0 2 0 0 0  PHQ-9 Score 0 11  Difficult doing work/chores Very difficult Extremely dIfficult   Somewhat difficult    Past Medical History:  Past Medical History:  Diagnosis Date   Asthma    COPD (chronic obstructive pulmonary disease)    Diabetes mellitus without complication    Hypertension    Macular degeneration    Stroke     Surgical History:  Past Surgical History:  Procedure Laterality Date   ABDOMINAL HYSTERECTOMY     HIP ARTHROPLASTY      Medications:  Current Outpatient Medications on File Prior to Visit  Medication Sig   amLODipine (NORVASC) 5 MG tablet Take 1 tablet (5 mg total) by mouth daily. TAKE 1 TABLET BY MOUTH ONCE EVERY DAY   aspirin 81 MG EC tablet Take 81 mg by mouth daily.   atorvastatin (LIPITOR) 40 MG tablet TAKE 1 TABLET BY MOUTH ONCE EVERY DAY   GNP NATURAL FIBER 0.52 g capsule TAKE 1 CAPSULE BY MOUTH ONCE DAILY AT NOON   GNP VITAMIN D3 EXTRA STRENGTH 25 MCG (1000 UT) tablet TAKE 1 TABLET BY MOUTH ONCE EVERY DAY FOR SUPPLEMENT   JANUVIA 100 MG tablet Take 1 tablet (100 mg total) by mouth daily.   lidocaine (LIDODERM) 5 % Place 1 patch onto the skin daily. Remove & Discard patch within 12 hours or as directed by MD   losartan (COZAAR) 100 MG tablet Take 1 tablet (100 mg total) by mouth daily.   mirtazapine (REMERON) 30 MG tablet Take 1 tablet (30 mg total) by mouth at bedtime.   montelukast (SINGULAIR) 10 MG tablet Take 1 tablet (10 mg total) by mouth at bedtime. TAKE 1 TABLET(10 MG) BY MOUTH NIGHTLY   Multiple Vitamins-Minerals (GNP HEALTHY EYES SUPERVISION 2) CAPS TAKE 1 CAPSULE BY MOUTH 2 TIMES DAILY   naproxen (NAPROSYN) 500 MG tablet Take 1 tablet (500 mg total) by mouth 2 (two) times daily with a meal.   NOVOLOG FLEXPEN 100 UNIT/ML FlexPen Inject  4 Units into the skin 3 (three) times daily before meals. (plus up to 5u sliding scale correction)   sertraline (ZOLOFT) 50 MG tablet Take 1.5 tablets (75 mg total) by mouth daily. TAKE 1.5 TABLETS (75 MG TOTAL) BY MOUTH ONCE DAILY   TRUE METRIX BLOOD GLUCOSE TEST test strip TEST BLOOD SUGAR 4 TIMES DAILY BEFORE LEVEMIR IS GIVEN OR AS DIRECTED BY PHYSICIAN   TRUEplus Safety Lancets 28G MISC USE FOR FINGER STICK BLOOD SUGARS (CURRENTLY 4 TIMES DAILY)   ULTRACARE PEN NEEDLES 33G X 4 MM MISC USE WITH INSULIN PENS AS DIRECTED BY PHYSICIAN (CURRENTLY 4 TIMES DAILY)   No current facility-administered medications on file prior to visit.    Allergies:  Allergies  Allergen Reactions   Sulfa Antibiotics Anaphylaxis    Social History:  Social History   Socioeconomic History   Marital status: Widowed  Spouse name: Not on file   Number of children: Not on file   Years of education: Not on file   Highest education level: Not on file  Occupational History   Not on file  Tobacco Use   Smoking status: Former   Smokeless tobacco: Never  Vaping Use   Vaping Use: Never used  Substance and Sexual Activity   Alcohol use: Never   Drug use: Never   Sexual activity: Not Currently  Other Topics Concern   Not on file  Social History Narrative   Not on file   Social Determinants of Health   Financial Resource Strain: Not on file  Food Insecurity: Not on file  Transportation Needs: Not on file  Physical Activity: Not on file  Stress: Not on file  Social Connections: Not on file  Intimate Partner Violence: Not on file   Social History   Tobacco Use  Smoking Status Former  Smokeless Tobacco Never   Social History   Substance and Sexual Activity  Alcohol Use Never    Family History:  Family History  Problem Relation Age of Onset   Arthritis Mother    Heart disease Mother     Past medical history, surgical history, medications, allergies, family history and social history  reviewed with patient today and changes made to appropriate areas of the chart.   Review of Systems  Constitutional: Negative.   HENT: Negative.    Eyes: Negative.   Respiratory:  Positive for cough. Negative for hemoptysis, sputum production, shortness of breath and wheezing.   Cardiovascular: Negative.   Gastrointestinal:  Positive for constipation and diarrhea. Negative for abdominal pain, blood in stool, heartburn, melena, nausea and vomiting.  Genitourinary: Negative.   Musculoskeletal: Negative.   Skin: Negative.   Neurological: Negative.   Endo/Heme/Allergies:  Positive for environmental allergies. Negative for polydipsia. Does not bruise/bleed easily.  Psychiatric/Behavioral: Negative.     All other ROS negative except what is listed above and in the HPI.      Objective:    BP 122/75   Pulse 87   Ht 4\' 11"  (1.499 m)   Wt 94 lb 4.8 oz (42.8 kg)   SpO2 (!) 85%   BMI 19.05 kg/m   Wt Readings from Last 3 Encounters:  04/20/22 94 lb 4.8 oz (42.8 kg)  01/19/22 92 lb 6.4 oz (41.9 kg)  12/09/21 95 lb (43.1 kg)    Physical Exam  Results for orders placed or performed in visit on 12/22/21  Microscopic Examination   Urine  Result Value Ref Range   WBC, UA 0-5 0 - 5 /hpf   RBC, Urine None seen 0 - 2 /hpf   Epithelial Cells (non renal) 0-10 0 - 10 /hpf   Bacteria, UA None seen None seen/Few  Urinalysis, Routine w reflex microscopic  Result Value Ref Range   Specific Gravity, UA 1.015 1.005 - 1.030   pH, UA 5.0 5.0 - 7.5   Color, UA Yellow Yellow   Appearance Ur Clear Clear   Leukocytes,UA Negative Negative   Protein,UA 1+ (A) Negative/Trace   Glucose, UA 2+ (A) Negative   Ketones, UA 2+ (A) Negative   RBC, UA Negative Negative   Bilirubin, UA Negative Negative   Urobilinogen, Ur 1.0 0.2 - 1.0 mg/dL   Nitrite, UA Negative Negative   Microscopic Examination See below:       Assessment & Plan:   Problem List Items Addressed This Visit       Respiratory  COPD  (chronic obstructive pulmonary disease)   Relevant Orders   CBC with Differential/Platelet   Comprehensive metabolic panel     Endocrine   Type 2 diabetes mellitus with renal complication   Relevant Orders   Bayer DCA Hb A1c Waived   Microalbumin, Urine Waived   CBC with Differential/Platelet   Comprehensive metabolic panel   Hyperlipidemia associated with type 2 diabetes mellitus   Relevant Orders   CBC with Differential/Platelet   Comprehensive metabolic panel   Lipid Panel w/o Chol/HDL Ratio     Genitourinary   Benign hypertensive renal disease   Relevant Orders   Microalbumin, Urine Waived   CBC with Differential/Platelet   Comprehensive metabolic panel   Urinalysis, Routine w reflex microscopic   TSH   Stage 3 chronic kidney disease   Relevant Orders   CBC with Differential/Platelet   Comprehensive metabolic panel   Urinalysis, Routine w reflex microscopic     Other   Depression, recurrent   Relevant Orders   CBC with Differential/Platelet   Comprehensive metabolic panel   Other Visit Diagnoses     Routine general medical examination at a health care facility    -  Primary        Follow up plan: No follow-ups on file.   LABORATORY TESTING:  - Pap smear: not applicable  IMMUNIZATIONS:   - Tdap: Tetanus vaccination status reviewed: Declined. - Influenza: Postponed to flu season - Pneumovax: Up to date - Prevnar: Up to date - COVID: Up to date - HPV: Not applicable - Shingrix vaccine: Refused  SCREENING: -Mammogram: Not applicable  - Colonoscopy: Not applicable  - Bone Density: Up to date   PATIENT COUNSELING:   Advised to take 1 mg of folate supplement per day if capable of pregnancy.   Sexuality: Discussed sexually transmitted diseases, partner selection, use of condoms, avoidance of unintended pregnancy  and contraceptive alternatives.   Advised to avoid cigarette smoking.  I discussed with the patient that most people either abstain from  alcohol or drink within safe limits (<=14/week and <=4 drinks/occasion for males, <=7/weeks and <= 3 drinks/occasion for females) and that the risk for alcohol disorders and other health effects rises proportionally with the number of drinks per week and how often a drinker exceeds daily limits.  Discussed cessation/primary prevention of drug use and availability of treatment for abuse.   Diet: Encouraged to adjust caloric intake to maintain  or achieve ideal body weight, to reduce intake of dietary saturated fat and total fat, to limit sodium intake by avoiding high sodium foods and not adding table salt, and to maintain adequate dietary potassium and calcium preferably from fresh fruits, vegetables, and low-fat dairy products.    stressed the importance of regular exercise  Injury prevention: Discussed safety belts, safety helmets, smoke detector, smoking near bedding or upholstery.   Dental health: Discussed importance of regular tooth brushing, flossing, and dental visits.    NEXT PREVENTATIVE PHYSICAL DUE IN 1 YEAR. No follow-ups on file.

## 2022-04-21 LAB — COMPREHENSIVE METABOLIC PANEL
ALT: 63 IU/L — ABNORMAL HIGH (ref 0–32)
AST: 43 IU/L — ABNORMAL HIGH (ref 0–40)
Albumin/Globulin Ratio: 1.7 (ref 1.2–2.2)
Albumin: 4.2 g/dL (ref 3.6–4.6)
Alkaline Phosphatase: 103 IU/L (ref 44–121)
BUN/Creatinine Ratio: 20 (ref 12–28)
BUN: 21 mg/dL (ref 10–36)
Bilirubin Total: 0.3 mg/dL (ref 0.0–1.2)
CO2: 21 mmol/L (ref 20–29)
Calcium: 9 mg/dL (ref 8.7–10.3)
Chloride: 104 mmol/L (ref 96–106)
Creatinine, Ser: 1.03 mg/dL — ABNORMAL HIGH (ref 0.57–1.00)
Globulin, Total: 2.5 g/dL (ref 1.5–4.5)
Glucose: 323 mg/dL — ABNORMAL HIGH (ref 70–99)
Potassium: 4.2 mmol/L (ref 3.5–5.2)
Sodium: 142 mmol/L (ref 134–144)
Total Protein: 6.7 g/dL (ref 6.0–8.5)
eGFR: 49 mL/min/{1.73_m2} — ABNORMAL LOW (ref >59)

## 2022-04-21 LAB — CBC WITH DIFFERENTIAL/PLATELET
Basophils Absolute: 0.1 10*3/uL (ref 0.0–0.2)
Basos: 1 %
EOS (ABSOLUTE): 0.2 10*3/uL (ref 0.0–0.4)
Eos: 4 %
Hematocrit: 39.7 % (ref 34.0–46.6)
Hemoglobin: 12.8 g/dL (ref 11.1–15.9)
Immature Grans (Abs): 0 10*3/uL (ref 0.0–0.1)
Immature Granulocytes: 1 %
Lymphocytes Absolute: 1.3 10*3/uL (ref 0.7–3.1)
Lymphs: 22 %
MCH: 31.1 pg (ref 26.6–33.0)
MCHC: 32.2 g/dL (ref 31.5–35.7)
MCV: 96 fL (ref 79–97)
Monocytes Absolute: 0.6 10*3/uL (ref 0.1–0.9)
Monocytes: 11 %
Neutrophils Absolute: 3.4 10*3/uL (ref 1.4–7.0)
Neutrophils: 61 %
Platelets: 146 10*3/uL — ABNORMAL LOW (ref 150–450)
RBC: 4.12 x10E6/uL (ref 3.77–5.28)
RDW: 12.6 % (ref 11.7–15.4)
WBC: 5.6 10*3/uL (ref 3.4–10.8)

## 2022-04-21 LAB — LIPID PANEL W/O CHOL/HDL RATIO
Cholesterol, Total: 125 mg/dL (ref 100–199)
HDL: 50 mg/dL (ref >39)
LDL Chol Calc (NIH): 54 mg/dL (ref 0–99)
Triglycerides: 114 mg/dL (ref 0–149)
VLDL Cholesterol Cal: 21 mg/dL (ref 5–40)

## 2022-04-21 LAB — TSH: TSH: 1.24 u[IU]/mL (ref 0.450–4.500)

## 2022-04-22 ENCOUNTER — Encounter: Payer: Self-pay | Admitting: Family Medicine

## 2022-04-22 DIAGNOSIS — F0390 Unspecified dementia without behavioral disturbance: Secondary | ICD-10-CM | POA: Insufficient documentation

## 2022-04-22 MED ORDER — MIRTAZAPINE 30 MG PO TABS: 30.0000 mg | ORAL_TABLET | Freq: Every day | ORAL | 1 refills | Status: DC

## 2022-04-22 MED ORDER — ATORVASTATIN CALCIUM 40 MG PO TABS: ORAL_TABLET | ORAL | 1 refills | Status: DC

## 2022-04-22 MED ORDER — LOSARTAN POTASSIUM 100 MG PO TABS: 100.0000 mg | ORAL_TABLET | Freq: Every day | ORAL | 1 refills | Status: DC

## 2022-04-22 MED ORDER — AMLODIPINE BESYLATE 5 MG PO TABS: 5.0000 mg | ORAL_TABLET | Freq: Every day | ORAL | 1 refills | Status: DC

## 2022-04-22 MED ORDER — MONTELUKAST SODIUM 10 MG PO TABS: 10.0000 mg | ORAL_TABLET | Freq: Every day | ORAL | 1 refills | Status: DC

## 2022-04-22 MED ORDER — NOVOLOG FLEXPEN 100 UNIT/ML ~~LOC~~ SOPN: 4.0000 [IU] | PEN_INJECTOR | Freq: Three times a day (TID) | SUBCUTANEOUS | 3 refills | Status: DC

## 2022-04-22 MED ORDER — SERTRALINE HCL 50 MG PO TABS: 75.0000 mg | ORAL_TABLET | Freq: Every day | ORAL | 1 refills | Status: DC

## 2022-04-22 MED ORDER — JANUVIA 100 MG PO TABS: 100.0000 mg | ORAL_TABLET | Freq: Every day | ORAL | 1 refills | Status: DC

## 2022-04-22 NOTE — Assessment & Plan Note (Signed)
Under good control on current regimen. Continue current regimen. Continue to monitor. Call with any concerns. Refills given. Labs drawn today.   

## 2022-04-22 NOTE — Assessment & Plan Note (Signed)
Rechecking labs today. Await results. Treat as needed.  °

## 2022-04-22 NOTE — Assessment & Plan Note (Signed)
Stable with A1c of 7.7. Continue to follow with endocrinology. Continue to monitor.

## 2022-04-27 DIAGNOSIS — M810 Age-related osteoporosis without current pathological fracture: Secondary | ICD-10-CM | POA: Diagnosis not present

## 2022-04-27 DIAGNOSIS — E113313 Type 2 diabetes mellitus with moderate nonproliferative diabetic retinopathy with macular edema, bilateral: Secondary | ICD-10-CM | POA: Diagnosis not present

## 2022-04-27 DIAGNOSIS — N1831 Chronic kidney disease, stage 3a: Secondary | ICD-10-CM | POA: Diagnosis not present

## 2022-04-27 DIAGNOSIS — E1122 Type 2 diabetes mellitus with diabetic chronic kidney disease: Secondary | ICD-10-CM | POA: Diagnosis not present

## 2022-04-27 DIAGNOSIS — E1129 Type 2 diabetes mellitus with other diabetic kidney complication: Secondary | ICD-10-CM | POA: Diagnosis not present

## 2022-04-27 DIAGNOSIS — Z794 Long term (current) use of insulin: Secondary | ICD-10-CM | POA: Diagnosis not present

## 2022-04-27 DIAGNOSIS — R809 Proteinuria, unspecified: Secondary | ICD-10-CM | POA: Diagnosis not present

## 2022-05-11 DIAGNOSIS — E1122 Type 2 diabetes mellitus with diabetic chronic kidney disease: Secondary | ICD-10-CM | POA: Diagnosis not present

## 2022-06-11 DIAGNOSIS — E1122 Type 2 diabetes mellitus with diabetic chronic kidney disease: Secondary | ICD-10-CM | POA: Diagnosis not present

## 2022-07-11 DIAGNOSIS — E1122 Type 2 diabetes mellitus with diabetic chronic kidney disease: Secondary | ICD-10-CM | POA: Diagnosis not present

## 2022-07-14 ENCOUNTER — Ambulatory Visit: Payer: Self-pay

## 2022-07-14 DIAGNOSIS — R062 Wheezing: Secondary | ICD-10-CM | POA: Diagnosis not present

## 2022-07-14 DIAGNOSIS — M810 Age-related osteoporosis without current pathological fracture: Secondary | ICD-10-CM | POA: Diagnosis not present

## 2022-07-14 DIAGNOSIS — K8689 Other specified diseases of pancreas: Secondary | ICD-10-CM | POA: Diagnosis not present

## 2022-07-14 DIAGNOSIS — Z9181 History of falling: Secondary | ICD-10-CM | POA: Diagnosis not present

## 2022-07-14 DIAGNOSIS — S299XXA Unspecified injury of thorax, initial encounter: Secondary | ICD-10-CM | POA: Diagnosis not present

## 2022-07-14 DIAGNOSIS — Z8781 Personal history of (healed) traumatic fracture: Secondary | ICD-10-CM | POA: Diagnosis not present

## 2022-07-14 DIAGNOSIS — S3282XA Multiple fractures of pelvis without disruption of pelvic ring, initial encounter for closed fracture: Secondary | ICD-10-CM | POA: Diagnosis not present

## 2022-07-14 DIAGNOSIS — F03C3 Unspecified dementia, severe, with mood disturbance: Secondary | ICD-10-CM | POA: Diagnosis not present

## 2022-07-14 DIAGNOSIS — S0990XA Unspecified injury of head, initial encounter: Secondary | ICD-10-CM | POA: Diagnosis not present

## 2022-07-14 DIAGNOSIS — I129 Hypertensive chronic kidney disease with stage 1 through stage 4 chronic kidney disease, or unspecified chronic kidney disease: Secondary | ICD-10-CM | POA: Diagnosis not present

## 2022-07-14 DIAGNOSIS — E113213 Type 2 diabetes mellitus with mild nonproliferative diabetic retinopathy with macular edema, bilateral: Secondary | ICD-10-CM | POA: Diagnosis not present

## 2022-07-14 DIAGNOSIS — W1839XA Other fall on same level, initial encounter: Secondary | ICD-10-CM | POA: Diagnosis not present

## 2022-07-14 DIAGNOSIS — J449 Chronic obstructive pulmonary disease, unspecified: Secondary | ICD-10-CM | POA: Diagnosis not present

## 2022-07-14 DIAGNOSIS — R918 Other nonspecific abnormal finding of lung field: Secondary | ICD-10-CM | POA: Diagnosis not present

## 2022-07-14 DIAGNOSIS — F32A Depression, unspecified: Secondary | ICD-10-CM | POA: Diagnosis not present

## 2022-07-14 DIAGNOSIS — Z96642 Presence of left artificial hip joint: Secondary | ICD-10-CM | POA: Diagnosis not present

## 2022-07-14 DIAGNOSIS — E559 Vitamin D deficiency, unspecified: Secondary | ICD-10-CM | POA: Diagnosis not present

## 2022-07-14 DIAGNOSIS — M800AXA Age-related osteoporosis with current pathological fracture, other site, initial encounter for fracture: Secondary | ICD-10-CM | POA: Diagnosis not present

## 2022-07-14 DIAGNOSIS — D696 Thrombocytopenia, unspecified: Secondary | ICD-10-CM | POA: Diagnosis not present

## 2022-07-14 DIAGNOSIS — Z66 Do not resuscitate: Secondary | ICD-10-CM | POA: Diagnosis not present

## 2022-07-14 DIAGNOSIS — R0602 Shortness of breath: Secondary | ICD-10-CM | POA: Diagnosis not present

## 2022-07-14 DIAGNOSIS — G4700 Insomnia, unspecified: Secondary | ICD-10-CM | POA: Diagnosis not present

## 2022-07-14 DIAGNOSIS — R404 Transient alteration of awareness: Secondary | ICD-10-CM | POA: Diagnosis not present

## 2022-07-14 DIAGNOSIS — K5901 Slow transit constipation: Secondary | ICD-10-CM | POA: Diagnosis not present

## 2022-07-14 DIAGNOSIS — Z7984 Long term (current) use of oral hypoglycemic drugs: Secondary | ICD-10-CM | POA: Diagnosis not present

## 2022-07-14 DIAGNOSIS — E11319 Type 2 diabetes mellitus with unspecified diabetic retinopathy without macular edema: Secondary | ICD-10-CM | POA: Diagnosis not present

## 2022-07-14 DIAGNOSIS — I350 Nonrheumatic aortic (valve) stenosis: Secondary | ICD-10-CM | POA: Diagnosis not present

## 2022-07-14 DIAGNOSIS — E1165 Type 2 diabetes mellitus with hyperglycemia: Secondary | ICD-10-CM | POA: Diagnosis not present

## 2022-07-14 DIAGNOSIS — Y92009 Unspecified place in unspecified non-institutional (private) residence as the place of occurrence of the external cause: Secondary | ICD-10-CM | POA: Diagnosis not present

## 2022-07-14 DIAGNOSIS — W01198A Fall on same level from slipping, tripping and stumbling with subsequent striking against other object, initial encounter: Secondary | ICD-10-CM | POA: Diagnosis not present

## 2022-07-14 DIAGNOSIS — S3992XA Unspecified injury of lower back, initial encounter: Secondary | ICD-10-CM | POA: Diagnosis not present

## 2022-07-14 DIAGNOSIS — F331 Major depressive disorder, recurrent, moderate: Secondary | ICD-10-CM | POA: Diagnosis not present

## 2022-07-14 DIAGNOSIS — M8589 Other specified disorders of bone density and structure, multiple sites: Secondary | ICD-10-CM | POA: Diagnosis not present

## 2022-07-14 DIAGNOSIS — I10 Essential (primary) hypertension: Secondary | ICD-10-CM | POA: Diagnosis not present

## 2022-07-14 DIAGNOSIS — M85852 Other specified disorders of bone density and structure, left thigh: Secondary | ICD-10-CM | POA: Diagnosis not present

## 2022-07-14 DIAGNOSIS — E118 Type 2 diabetes mellitus with unspecified complications: Secondary | ICD-10-CM | POA: Diagnosis not present

## 2022-07-14 DIAGNOSIS — K862 Cyst of pancreas: Secondary | ICD-10-CM | POA: Diagnosis not present

## 2022-07-14 DIAGNOSIS — S32009A Unspecified fracture of unspecified lumbar vertebra, initial encounter for closed fracture: Secondary | ICD-10-CM | POA: Diagnosis not present

## 2022-07-14 DIAGNOSIS — J90 Pleural effusion, not elsewhere classified: Secondary | ICD-10-CM | POA: Diagnosis not present

## 2022-07-14 DIAGNOSIS — Z794 Long term (current) use of insulin: Secondary | ICD-10-CM | POA: Diagnosis not present

## 2022-07-14 DIAGNOSIS — W19XXXD Unspecified fall, subsequent encounter: Secondary | ICD-10-CM | POA: Diagnosis not present

## 2022-07-14 DIAGNOSIS — K5790 Diverticulosis of intestine, part unspecified, without perforation or abscess without bleeding: Secondary | ICD-10-CM | POA: Diagnosis not present

## 2022-07-14 DIAGNOSIS — D3500 Benign neoplasm of unspecified adrenal gland: Secondary | ICD-10-CM | POA: Diagnosis not present

## 2022-07-14 DIAGNOSIS — Z8673 Personal history of transient ischemic attack (TIA), and cerebral infarction without residual deficits: Secondary | ICD-10-CM | POA: Diagnosis not present

## 2022-07-14 DIAGNOSIS — E785 Hyperlipidemia, unspecified: Secondary | ICD-10-CM | POA: Diagnosis not present

## 2022-07-14 DIAGNOSIS — W1830XA Fall on same level, unspecified, initial encounter: Secondary | ICD-10-CM | POA: Diagnosis not present

## 2022-07-14 DIAGNOSIS — E7800 Pure hypercholesterolemia, unspecified: Secondary | ICD-10-CM | POA: Diagnosis not present

## 2022-07-14 DIAGNOSIS — F05 Delirium due to known physiological condition: Secondary | ICD-10-CM | POA: Diagnosis not present

## 2022-07-14 DIAGNOSIS — W19XXXA Unspecified fall, initial encounter: Secondary | ICD-10-CM | POA: Diagnosis not present

## 2022-07-14 DIAGNOSIS — J4489 Other specified chronic obstructive pulmonary disease: Secondary | ICD-10-CM | POA: Diagnosis not present

## 2022-07-14 DIAGNOSIS — F39 Unspecified mood [affective] disorder: Secondary | ICD-10-CM | POA: Diagnosis not present

## 2022-07-14 DIAGNOSIS — S32592D Other specified fracture of left pubis, subsequent encounter for fracture with routine healing: Secondary | ICD-10-CM | POA: Diagnosis not present

## 2022-07-14 DIAGNOSIS — I700 Atherosclerosis of aorta: Secondary | ICD-10-CM | POA: Diagnosis not present

## 2022-07-14 DIAGNOSIS — J811 Chronic pulmonary edema: Secondary | ICD-10-CM | POA: Diagnosis not present

## 2022-07-14 DIAGNOSIS — T07XXXA Unspecified multiple injuries, initial encounter: Secondary | ICD-10-CM | POA: Diagnosis not present

## 2022-07-14 DIAGNOSIS — J9601 Acute respiratory failure with hypoxia: Secondary | ICD-10-CM | POA: Diagnosis not present

## 2022-07-14 DIAGNOSIS — D72829 Elevated white blood cell count, unspecified: Secondary | ICD-10-CM | POA: Diagnosis not present

## 2022-07-14 DIAGNOSIS — S32810A Multiple fractures of pelvis with stable disruption of pelvic ring, initial encounter for closed fracture: Secondary | ICD-10-CM | POA: Diagnosis not present

## 2022-07-14 DIAGNOSIS — M800B2A Age-related osteoporosis with current pathological fracture, left pelvis, initial encounter for fracture: Secondary | ICD-10-CM | POA: Diagnosis not present

## 2022-07-14 DIAGNOSIS — Z825 Family history of asthma and other chronic lower respiratory diseases: Secondary | ICD-10-CM | POA: Diagnosis not present

## 2022-07-14 DIAGNOSIS — F0390 Unspecified dementia without behavioral disturbance: Secondary | ICD-10-CM | POA: Diagnosis not present

## 2022-07-14 DIAGNOSIS — R5381 Other malaise: Secondary | ICD-10-CM | POA: Diagnosis not present

## 2022-07-14 DIAGNOSIS — S199XXA Unspecified injury of neck, initial encounter: Secondary | ICD-10-CM | POA: Diagnosis not present

## 2022-07-14 DIAGNOSIS — S32512A Fracture of superior rim of left pubis, initial encounter for closed fracture: Secondary | ICD-10-CM | POA: Diagnosis not present

## 2022-07-14 DIAGNOSIS — M25552 Pain in left hip: Secondary | ICD-10-CM | POA: Diagnosis not present

## 2022-07-14 DIAGNOSIS — Z7401 Bed confinement status: Secondary | ICD-10-CM | POA: Diagnosis not present

## 2022-07-14 DIAGNOSIS — N1831 Chronic kidney disease, stage 3a: Secondary | ICD-10-CM | POA: Diagnosis not present

## 2022-07-14 DIAGNOSIS — Z87891 Personal history of nicotine dependence: Secondary | ICD-10-CM | POA: Diagnosis not present

## 2022-07-14 DIAGNOSIS — S32592A Other specified fracture of left pubis, initial encounter for closed fracture: Secondary | ICD-10-CM | POA: Diagnosis not present

## 2022-07-14 DIAGNOSIS — K5900 Constipation, unspecified: Secondary | ICD-10-CM | POA: Diagnosis not present

## 2022-07-14 DIAGNOSIS — Z1152 Encounter for screening for COVID-19: Secondary | ICD-10-CM | POA: Diagnosis not present

## 2022-07-14 DIAGNOSIS — H3530 Unspecified macular degeneration: Secondary | ICD-10-CM | POA: Diagnosis not present

## 2022-07-14 DIAGNOSIS — E1122 Type 2 diabetes mellitus with diabetic chronic kidney disease: Secondary | ICD-10-CM | POA: Diagnosis not present

## 2022-07-14 DIAGNOSIS — R4189 Other symptoms and signs involving cognitive functions and awareness: Secondary | ICD-10-CM | POA: Diagnosis not present

## 2022-07-14 DIAGNOSIS — Z87310 Personal history of (healed) osteoporosis fracture: Secondary | ICD-10-CM | POA: Diagnosis not present

## 2022-07-14 DIAGNOSIS — J9811 Atelectasis: Secondary | ICD-10-CM | POA: Diagnosis not present

## 2022-07-14 DIAGNOSIS — M1990 Unspecified osteoarthritis, unspecified site: Secondary | ICD-10-CM | POA: Diagnosis not present

## 2022-07-14 DIAGNOSIS — Z7189 Other specified counseling: Secondary | ICD-10-CM | POA: Diagnosis not present

## 2022-07-14 DIAGNOSIS — I517 Cardiomegaly: Secondary | ICD-10-CM | POA: Diagnosis not present

## 2022-07-14 DIAGNOSIS — E113313 Type 2 diabetes mellitus with moderate nonproliferative diabetic retinopathy with macular edema, bilateral: Secondary | ICD-10-CM | POA: Diagnosis not present

## 2022-07-14 NOTE — Telephone Encounter (Signed)
Chief Complaint: fall head injury  Symptoms: confused- does not know her daughter is , brusing to hip Frequency: this am  Pertinent Negatives: Patient denies pain  Disposition: [x] ED /[] Urgent Care (no appt availability in office) / [] Appointment(In office/virtual)/ []  [facility]/ [] Home Care/ [] Refused Recommended Disposition /[] [place] [facility]/ []  Follow-up with PCP Additional Notes: called 911 due to serious MS change Reason for Disposition  [1] ACUTE NEURO SYMPTOM AND [2] present now  (DEFINITION: difficult to awaken OR confused thinking and talking OR slurred speech OR weakness of arms OR unsteady walking)  Answer Assessment - Initial Assessment Questions 1. MECHANISM: "How did the injury happen?" For falls, ask: "What height did you fall from?" and "What surface did you fall against?"      Lost balance  2. ONSET: "When did the injury happen?" (Minutes or hours ago)      Fall  3. NEUROLOGIC SYMPTOMS: "Was there any loss of consciousness?" "Are there any other neurological symptoms?"      No/ does not know daughter name or who she is  4. MENTAL STATUS: "Does the person know who they are, who you are, and where they are?"      no 5. LOCATION: "What part of the head was hit?"      Back of head  6. SCALP APPEARANCE: "What does the scalp look like? Is it bleeding now?" If Yes, ask: "Is it difficult to stop?"      Does not see any  bruising 7. SIZE: For cuts, bruises, or swelling, ask: "How large is it?" (e.g., inches or centimeters)      none 10. OTHER SYMPTOMS: "Do you have any other symptoms?" (e.g., neck pain, vomiting)       Hip bruise  Answer Assessment - Initial Assessment Questions 1. MECHANISM: "How did the fall happen?"     Not using walker lost balance  2. DOMESTIC VIOLENCE AND ELDER ABUSE SCREENING: "Did you fall because someone pushed you or tried to hurt you?" If Yes, ask: "Are you safe now?"     N/a 3. ONSET: "When did the fall happen?" (e.g.,  minutes, hours, or days ago)     This am  4. LOCATION: "What part of the body hit the ground?" (e.g., back, buttocks, head, hips, knees, hands, head, stomach)     head 5. INJURY: "Did you hurt (injure) yourself when you fell?" If Yes, ask: "What did you injure? Tell me more about this?" (e.g., body area; type of injury; pain severity)"     *No Answer* 6. PAIN: "Is there any pain?" If Yes, ask: "How bad is the pain?" (e.g., Scale 1-10; or mild,  moderate, severe)   - NONE (0): No pain   - MILD (1-3): Doesn't interfere with normal activities    - MODERATE (4-7): Interferes with normal activities or awakens from sleep    - SEVERE (8-10): Excruciating pain, unable to do any normal activities      *No Answer* 7. SIZE: For cuts, bruises, or swelling, ask: "How large is it?" (e.g., inches or centimeters)      *No Answer* 9. OTHER SYMPTOMS: "Do you have any other symptoms?" (e.g., dizziness, fever, weakness; new onset or worsening).      Hit head, shaking 175/83 sitting  10. CAUSE: "What do you think caused the fall (or falling)?" (e.g., tripped, dizzy spell)       Lost balance, strain to get up after unsteady  Protocols used: Falls and Falling-A-AH, Head Injury-A-AH

## 2022-07-14 NOTE — Telephone Encounter (Signed)
Agree with need for ER and 911 to further assess ASAP.

## 2022-07-15 DIAGNOSIS — F39 Unspecified mood [affective] disorder: Secondary | ICD-10-CM | POA: Diagnosis not present

## 2022-07-15 DIAGNOSIS — E113213 Type 2 diabetes mellitus with mild nonproliferative diabetic retinopathy with macular edema, bilateral: Secondary | ICD-10-CM | POA: Diagnosis not present

## 2022-07-15 DIAGNOSIS — S32009A Unspecified fracture of unspecified lumbar vertebra, initial encounter for closed fracture: Secondary | ICD-10-CM | POA: Diagnosis not present

## 2022-07-15 DIAGNOSIS — E1122 Type 2 diabetes mellitus with diabetic chronic kidney disease: Secondary | ICD-10-CM | POA: Diagnosis not present

## 2022-07-15 DIAGNOSIS — N1831 Chronic kidney disease, stage 3a: Secondary | ICD-10-CM | POA: Diagnosis not present

## 2022-07-15 DIAGNOSIS — E1165 Type 2 diabetes mellitus with hyperglycemia: Secondary | ICD-10-CM | POA: Diagnosis not present

## 2022-07-15 DIAGNOSIS — Z8781 Personal history of (healed) traumatic fracture: Secondary | ICD-10-CM | POA: Diagnosis not present

## 2022-07-15 DIAGNOSIS — W1839XA Other fall on same level, initial encounter: Secondary | ICD-10-CM | POA: Diagnosis not present

## 2022-07-15 DIAGNOSIS — E785 Hyperlipidemia, unspecified: Secondary | ICD-10-CM | POA: Diagnosis not present

## 2022-07-15 DIAGNOSIS — Z825 Family history of asthma and other chronic lower respiratory diseases: Secondary | ICD-10-CM | POA: Diagnosis not present

## 2022-07-15 DIAGNOSIS — I129 Hypertensive chronic kidney disease with stage 1 through stage 4 chronic kidney disease, or unspecified chronic kidney disease: Secondary | ICD-10-CM | POA: Diagnosis not present

## 2022-07-15 DIAGNOSIS — Z87310 Personal history of (healed) osteoporosis fracture: Secondary | ICD-10-CM | POA: Diagnosis not present

## 2022-07-15 DIAGNOSIS — J9601 Acute respiratory failure with hypoxia: Secondary | ICD-10-CM | POA: Insufficient documentation

## 2022-07-16 DIAGNOSIS — Z825 Family history of asthma and other chronic lower respiratory diseases: Secondary | ICD-10-CM | POA: Diagnosis not present

## 2022-07-16 DIAGNOSIS — W1839XA Other fall on same level, initial encounter: Secondary | ICD-10-CM | POA: Diagnosis not present

## 2022-07-16 DIAGNOSIS — S32009A Unspecified fracture of unspecified lumbar vertebra, initial encounter for closed fracture: Secondary | ICD-10-CM | POA: Diagnosis not present

## 2022-07-16 DIAGNOSIS — E1122 Type 2 diabetes mellitus with diabetic chronic kidney disease: Secondary | ICD-10-CM | POA: Diagnosis not present

## 2022-07-16 DIAGNOSIS — F39 Unspecified mood [affective] disorder: Secondary | ICD-10-CM | POA: Diagnosis not present

## 2022-07-16 DIAGNOSIS — E785 Hyperlipidemia, unspecified: Secondary | ICD-10-CM | POA: Diagnosis not present

## 2022-07-16 DIAGNOSIS — I129 Hypertensive chronic kidney disease with stage 1 through stage 4 chronic kidney disease, or unspecified chronic kidney disease: Secondary | ICD-10-CM | POA: Diagnosis not present

## 2022-07-16 DIAGNOSIS — Z87310 Personal history of (healed) osteoporosis fracture: Secondary | ICD-10-CM | POA: Diagnosis not present

## 2022-07-16 DIAGNOSIS — Z8781 Personal history of (healed) traumatic fracture: Secondary | ICD-10-CM | POA: Diagnosis not present

## 2022-07-16 DIAGNOSIS — N1831 Chronic kidney disease, stage 3a: Secondary | ICD-10-CM | POA: Diagnosis not present

## 2022-07-16 DIAGNOSIS — E1165 Type 2 diabetes mellitus with hyperglycemia: Secondary | ICD-10-CM | POA: Diagnosis not present

## 2022-07-16 DIAGNOSIS — E113213 Type 2 diabetes mellitus with mild nonproliferative diabetic retinopathy with macular edema, bilateral: Secondary | ICD-10-CM | POA: Diagnosis not present

## 2022-07-17 DIAGNOSIS — E1165 Type 2 diabetes mellitus with hyperglycemia: Secondary | ICD-10-CM | POA: Diagnosis not present

## 2022-07-17 DIAGNOSIS — I129 Hypertensive chronic kidney disease with stage 1 through stage 4 chronic kidney disease, or unspecified chronic kidney disease: Secondary | ICD-10-CM | POA: Diagnosis not present

## 2022-07-17 DIAGNOSIS — F39 Unspecified mood [affective] disorder: Secondary | ICD-10-CM | POA: Diagnosis not present

## 2022-07-17 DIAGNOSIS — N1831 Chronic kidney disease, stage 3a: Secondary | ICD-10-CM | POA: Diagnosis not present

## 2022-07-17 DIAGNOSIS — W1839XA Other fall on same level, initial encounter: Secondary | ICD-10-CM | POA: Diagnosis not present

## 2022-07-17 DIAGNOSIS — E785 Hyperlipidemia, unspecified: Secondary | ICD-10-CM | POA: Diagnosis not present

## 2022-07-17 DIAGNOSIS — E113213 Type 2 diabetes mellitus with mild nonproliferative diabetic retinopathy with macular edema, bilateral: Secondary | ICD-10-CM | POA: Diagnosis not present

## 2022-07-17 DIAGNOSIS — E1122 Type 2 diabetes mellitus with diabetic chronic kidney disease: Secondary | ICD-10-CM | POA: Diagnosis not present

## 2022-07-17 DIAGNOSIS — S32009A Unspecified fracture of unspecified lumbar vertebra, initial encounter for closed fracture: Secondary | ICD-10-CM | POA: Diagnosis not present

## 2022-07-17 DIAGNOSIS — Z8781 Personal history of (healed) traumatic fracture: Secondary | ICD-10-CM | POA: Diagnosis not present

## 2022-07-17 DIAGNOSIS — Z87310 Personal history of (healed) osteoporosis fracture: Secondary | ICD-10-CM | POA: Diagnosis not present

## 2022-07-17 DIAGNOSIS — Z825 Family history of asthma and other chronic lower respiratory diseases: Secondary | ICD-10-CM | POA: Diagnosis not present

## 2022-07-18 DIAGNOSIS — J9601 Acute respiratory failure with hypoxia: Secondary | ICD-10-CM | POA: Diagnosis not present

## 2022-07-18 DIAGNOSIS — I129 Hypertensive chronic kidney disease with stage 1 through stage 4 chronic kidney disease, or unspecified chronic kidney disease: Secondary | ICD-10-CM | POA: Diagnosis not present

## 2022-07-18 DIAGNOSIS — E113213 Type 2 diabetes mellitus with mild nonproliferative diabetic retinopathy with macular edema, bilateral: Secondary | ICD-10-CM | POA: Diagnosis not present

## 2022-07-18 DIAGNOSIS — W1839XA Other fall on same level, initial encounter: Secondary | ICD-10-CM | POA: Diagnosis not present

## 2022-07-18 DIAGNOSIS — E1122 Type 2 diabetes mellitus with diabetic chronic kidney disease: Secondary | ICD-10-CM | POA: Diagnosis not present

## 2022-07-18 DIAGNOSIS — N1831 Chronic kidney disease, stage 3a: Secondary | ICD-10-CM | POA: Diagnosis not present

## 2022-07-18 DIAGNOSIS — S32009A Unspecified fracture of unspecified lumbar vertebra, initial encounter for closed fracture: Secondary | ICD-10-CM | POA: Diagnosis not present

## 2022-07-18 DIAGNOSIS — F39 Unspecified mood [affective] disorder: Secondary | ICD-10-CM | POA: Diagnosis not present

## 2022-07-18 DIAGNOSIS — Z87310 Personal history of (healed) osteoporosis fracture: Secondary | ICD-10-CM | POA: Diagnosis not present

## 2022-07-18 DIAGNOSIS — E1165 Type 2 diabetes mellitus with hyperglycemia: Secondary | ICD-10-CM | POA: Diagnosis not present

## 2022-07-18 DIAGNOSIS — Z825 Family history of asthma and other chronic lower respiratory diseases: Secondary | ICD-10-CM | POA: Diagnosis not present

## 2022-07-18 DIAGNOSIS — Z8781 Personal history of (healed) traumatic fracture: Secondary | ICD-10-CM | POA: Diagnosis not present

## 2022-07-18 DIAGNOSIS — E785 Hyperlipidemia, unspecified: Secondary | ICD-10-CM | POA: Diagnosis not present

## 2022-07-18 DIAGNOSIS — R918 Other nonspecific abnormal finding of lung field: Secondary | ICD-10-CM | POA: Diagnosis not present

## 2022-07-19 DIAGNOSIS — I350 Nonrheumatic aortic (valve) stenosis: Secondary | ICD-10-CM | POA: Diagnosis not present

## 2022-07-19 DIAGNOSIS — E118 Type 2 diabetes mellitus with unspecified complications: Secondary | ICD-10-CM | POA: Diagnosis not present

## 2022-07-19 DIAGNOSIS — T07XXXA Unspecified multiple injuries, initial encounter: Secondary | ICD-10-CM | POA: Diagnosis not present

## 2022-07-19 DIAGNOSIS — D72829 Elevated white blood cell count, unspecified: Secondary | ICD-10-CM | POA: Diagnosis not present

## 2022-07-19 DIAGNOSIS — Y92009 Unspecified place in unspecified non-institutional (private) residence as the place of occurrence of the external cause: Secondary | ICD-10-CM | POA: Diagnosis not present

## 2022-07-19 DIAGNOSIS — J9601 Acute respiratory failure with hypoxia: Secondary | ICD-10-CM | POA: Diagnosis not present

## 2022-07-19 DIAGNOSIS — N1831 Chronic kidney disease, stage 3a: Secondary | ICD-10-CM | POA: Diagnosis not present

## 2022-07-19 DIAGNOSIS — E7800 Pure hypercholesterolemia, unspecified: Secondary | ICD-10-CM | POA: Diagnosis not present

## 2022-07-19 DIAGNOSIS — K5901 Slow transit constipation: Secondary | ICD-10-CM | POA: Diagnosis not present

## 2022-07-19 DIAGNOSIS — F0390 Unspecified dementia without behavioral disturbance: Secondary | ICD-10-CM | POA: Diagnosis not present

## 2022-07-19 DIAGNOSIS — W1839XA Other fall on same level, initial encounter: Secondary | ICD-10-CM | POA: Diagnosis not present

## 2022-07-20 DIAGNOSIS — R5381 Other malaise: Secondary | ICD-10-CM | POA: Diagnosis not present

## 2022-07-20 DIAGNOSIS — E118 Type 2 diabetes mellitus with unspecified complications: Secondary | ICD-10-CM | POA: Diagnosis not present

## 2022-07-20 DIAGNOSIS — J9601 Acute respiratory failure with hypoxia: Secondary | ICD-10-CM | POA: Diagnosis not present

## 2022-07-20 DIAGNOSIS — F0390 Unspecified dementia without behavioral disturbance: Secondary | ICD-10-CM | POA: Diagnosis not present

## 2022-07-20 DIAGNOSIS — N1831 Chronic kidney disease, stage 3a: Secondary | ICD-10-CM | POA: Diagnosis not present

## 2022-07-20 DIAGNOSIS — W1839XA Other fall on same level, initial encounter: Secondary | ICD-10-CM | POA: Diagnosis not present

## 2022-07-20 DIAGNOSIS — Y92009 Unspecified place in unspecified non-institutional (private) residence as the place of occurrence of the external cause: Secondary | ICD-10-CM | POA: Diagnosis not present

## 2022-07-20 DIAGNOSIS — I350 Nonrheumatic aortic (valve) stenosis: Secondary | ICD-10-CM | POA: Diagnosis not present

## 2022-07-20 DIAGNOSIS — R4189 Other symptoms and signs involving cognitive functions and awareness: Secondary | ICD-10-CM | POA: Diagnosis not present

## 2022-07-20 DIAGNOSIS — Z7189 Other specified counseling: Secondary | ICD-10-CM | POA: Diagnosis not present

## 2022-07-20 DIAGNOSIS — T07XXXA Unspecified multiple injuries, initial encounter: Secondary | ICD-10-CM | POA: Diagnosis not present

## 2022-07-20 DIAGNOSIS — D72829 Elevated white blood cell count, unspecified: Secondary | ICD-10-CM | POA: Diagnosis not present

## 2022-07-20 DIAGNOSIS — E7800 Pure hypercholesterolemia, unspecified: Secondary | ICD-10-CM | POA: Diagnosis not present

## 2022-07-20 DIAGNOSIS — K5901 Slow transit constipation: Secondary | ICD-10-CM | POA: Diagnosis not present

## 2022-07-20 DIAGNOSIS — Z9181 History of falling: Secondary | ICD-10-CM | POA: Diagnosis not present

## 2022-07-20 DIAGNOSIS — G4700 Insomnia, unspecified: Secondary | ICD-10-CM | POA: Diagnosis not present

## 2022-07-20 DIAGNOSIS — F05 Delirium due to known physiological condition: Secondary | ICD-10-CM | POA: Diagnosis not present

## 2022-07-21 DIAGNOSIS — E113313 Type 2 diabetes mellitus with moderate nonproliferative diabetic retinopathy with macular edema, bilateral: Secondary | ICD-10-CM | POA: Diagnosis not present

## 2022-07-21 DIAGNOSIS — G309 Alzheimer's disease, unspecified: Secondary | ICD-10-CM | POA: Diagnosis not present

## 2022-07-21 DIAGNOSIS — Y92009 Unspecified place in unspecified non-institutional (private) residence as the place of occurrence of the external cause: Secondary | ICD-10-CM | POA: Diagnosis not present

## 2022-07-21 DIAGNOSIS — F0393 Unspecified dementia, unspecified severity, with mood disturbance: Secondary | ICD-10-CM | POA: Diagnosis not present

## 2022-07-21 DIAGNOSIS — F0390 Unspecified dementia without behavioral disturbance: Secondary | ICD-10-CM | POA: Diagnosis not present

## 2022-07-21 DIAGNOSIS — I10 Essential (primary) hypertension: Secondary | ICD-10-CM | POA: Diagnosis not present

## 2022-07-21 DIAGNOSIS — Z7401 Bed confinement status: Secondary | ICD-10-CM | POA: Diagnosis not present

## 2022-07-21 DIAGNOSIS — S32592D Other specified fracture of left pubis, subsequent encounter for fracture with routine healing: Secondary | ICD-10-CM | POA: Diagnosis not present

## 2022-07-21 DIAGNOSIS — M1990 Unspecified osteoarthritis, unspecified site: Secondary | ICD-10-CM | POA: Diagnosis not present

## 2022-07-21 DIAGNOSIS — E7800 Pure hypercholesterolemia, unspecified: Secondary | ICD-10-CM | POA: Diagnosis not present

## 2022-07-21 DIAGNOSIS — Z794 Long term (current) use of insulin: Secondary | ICD-10-CM | POA: Diagnosis not present

## 2022-07-21 DIAGNOSIS — I350 Nonrheumatic aortic (valve) stenosis: Secondary | ICD-10-CM | POA: Diagnosis not present

## 2022-07-21 DIAGNOSIS — J9601 Acute respiratory failure with hypoxia: Secondary | ICD-10-CM | POA: Diagnosis not present

## 2022-07-21 DIAGNOSIS — W1839XA Other fall on same level, initial encounter: Secondary | ICD-10-CM | POA: Diagnosis not present

## 2022-07-21 DIAGNOSIS — E118 Type 2 diabetes mellitus with unspecified complications: Secondary | ICD-10-CM | POA: Diagnosis not present

## 2022-07-21 DIAGNOSIS — S32000D Wedge compression fracture of unspecified lumbar vertebra, subsequent encounter for fracture with routine healing: Secondary | ICD-10-CM | POA: Diagnosis not present

## 2022-07-21 DIAGNOSIS — R102 Pelvic and perineal pain: Secondary | ICD-10-CM | POA: Diagnosis not present

## 2022-07-21 DIAGNOSIS — D72829 Elevated white blood cell count, unspecified: Secondary | ICD-10-CM | POA: Diagnosis not present

## 2022-07-21 DIAGNOSIS — G4700 Insomnia, unspecified: Secondary | ICD-10-CM | POA: Diagnosis not present

## 2022-07-21 DIAGNOSIS — Z7984 Long term (current) use of oral hypoglycemic drugs: Secondary | ICD-10-CM | POA: Diagnosis not present

## 2022-07-21 DIAGNOSIS — K5900 Constipation, unspecified: Secondary | ICD-10-CM | POA: Diagnosis not present

## 2022-07-21 DIAGNOSIS — E559 Vitamin D deficiency, unspecified: Secondary | ICD-10-CM | POA: Diagnosis not present

## 2022-07-21 DIAGNOSIS — R059 Cough, unspecified: Secondary | ICD-10-CM | POA: Diagnosis not present

## 2022-07-21 DIAGNOSIS — F03C3 Unspecified dementia, severe, with mood disturbance: Secondary | ICD-10-CM | POA: Diagnosis not present

## 2022-07-21 DIAGNOSIS — Z96642 Presence of left artificial hip joint: Secondary | ICD-10-CM | POA: Diagnosis not present

## 2022-07-21 DIAGNOSIS — K5790 Diverticulosis of intestine, part unspecified, without perforation or abscess without bleeding: Secondary | ICD-10-CM | POA: Diagnosis not present

## 2022-07-21 DIAGNOSIS — I129 Hypertensive chronic kidney disease with stage 1 through stage 4 chronic kidney disease, or unspecified chronic kidney disease: Secondary | ICD-10-CM | POA: Diagnosis not present

## 2022-07-21 DIAGNOSIS — M810 Age-related osteoporosis without current pathological fracture: Secondary | ICD-10-CM | POA: Diagnosis not present

## 2022-07-21 DIAGNOSIS — S32592A Other specified fracture of left pubis, initial encounter for closed fracture: Secondary | ICD-10-CM | POA: Diagnosis not present

## 2022-07-21 DIAGNOSIS — W19XXXD Unspecified fall, subsequent encounter: Secondary | ICD-10-CM | POA: Diagnosis not present

## 2022-07-21 DIAGNOSIS — K5901 Slow transit constipation: Secondary | ICD-10-CM | POA: Diagnosis not present

## 2022-07-21 DIAGNOSIS — J449 Chronic obstructive pulmonary disease, unspecified: Secondary | ICD-10-CM | POA: Diagnosis not present

## 2022-07-21 DIAGNOSIS — J189 Pneumonia, unspecified organism: Secondary | ICD-10-CM | POA: Diagnosis not present

## 2022-07-21 DIAGNOSIS — E119 Type 2 diabetes mellitus without complications: Secondary | ICD-10-CM | POA: Diagnosis not present

## 2022-07-21 DIAGNOSIS — Z8673 Personal history of transient ischemic attack (TIA), and cerebral infarction without residual deficits: Secondary | ICD-10-CM | POA: Diagnosis not present

## 2022-07-21 DIAGNOSIS — R404 Transient alteration of awareness: Secondary | ICD-10-CM | POA: Diagnosis not present

## 2022-07-21 DIAGNOSIS — E1122 Type 2 diabetes mellitus with diabetic chronic kidney disease: Secondary | ICD-10-CM | POA: Diagnosis not present

## 2022-07-21 DIAGNOSIS — F331 Major depressive disorder, recurrent, moderate: Secondary | ICD-10-CM | POA: Diagnosis not present

## 2022-07-21 DIAGNOSIS — N1831 Chronic kidney disease, stage 3a: Secondary | ICD-10-CM | POA: Diagnosis not present

## 2022-07-21 DIAGNOSIS — E785 Hyperlipidemia, unspecified: Secondary | ICD-10-CM | POA: Diagnosis not present

## 2022-07-21 DIAGNOSIS — T07XXXA Unspecified multiple injuries, initial encounter: Secondary | ICD-10-CM | POA: Diagnosis not present

## 2022-07-23 DIAGNOSIS — Z7984 Long term (current) use of oral hypoglycemic drugs: Secondary | ICD-10-CM | POA: Diagnosis not present

## 2022-07-23 DIAGNOSIS — J449 Chronic obstructive pulmonary disease, unspecified: Secondary | ICD-10-CM | POA: Diagnosis not present

## 2022-07-23 DIAGNOSIS — M810 Age-related osteoporosis without current pathological fracture: Secondary | ICD-10-CM | POA: Diagnosis not present

## 2022-07-23 DIAGNOSIS — I129 Hypertensive chronic kidney disease with stage 1 through stage 4 chronic kidney disease, or unspecified chronic kidney disease: Secondary | ICD-10-CM | POA: Diagnosis not present

## 2022-07-23 DIAGNOSIS — W19XXXD Unspecified fall, subsequent encounter: Secondary | ICD-10-CM | POA: Diagnosis not present

## 2022-07-23 DIAGNOSIS — F331 Major depressive disorder, recurrent, moderate: Secondary | ICD-10-CM | POA: Diagnosis not present

## 2022-07-23 DIAGNOSIS — J9601 Acute respiratory failure with hypoxia: Secondary | ICD-10-CM | POA: Diagnosis not present

## 2022-07-23 DIAGNOSIS — S32592D Other specified fracture of left pubis, subsequent encounter for fracture with routine healing: Secondary | ICD-10-CM | POA: Diagnosis not present

## 2022-07-23 DIAGNOSIS — E113313 Type 2 diabetes mellitus with moderate nonproliferative diabetic retinopathy with macular edema, bilateral: Secondary | ICD-10-CM | POA: Diagnosis not present

## 2022-07-23 DIAGNOSIS — E785 Hyperlipidemia, unspecified: Secondary | ICD-10-CM | POA: Diagnosis not present

## 2022-07-23 DIAGNOSIS — N1831 Chronic kidney disease, stage 3a: Secondary | ICD-10-CM | POA: Diagnosis not present

## 2022-07-23 DIAGNOSIS — Z794 Long term (current) use of insulin: Secondary | ICD-10-CM | POA: Diagnosis not present

## 2022-07-26 DIAGNOSIS — E113313 Type 2 diabetes mellitus with moderate nonproliferative diabetic retinopathy with macular edema, bilateral: Secondary | ICD-10-CM | POA: Diagnosis not present

## 2022-07-26 DIAGNOSIS — J9601 Acute respiratory failure with hypoxia: Secondary | ICD-10-CM | POA: Diagnosis not present

## 2022-07-26 DIAGNOSIS — Z794 Long term (current) use of insulin: Secondary | ICD-10-CM | POA: Diagnosis not present

## 2022-07-26 DIAGNOSIS — Z7984 Long term (current) use of oral hypoglycemic drugs: Secondary | ICD-10-CM | POA: Diagnosis not present

## 2022-07-26 DIAGNOSIS — I129 Hypertensive chronic kidney disease with stage 1 through stage 4 chronic kidney disease, or unspecified chronic kidney disease: Secondary | ICD-10-CM | POA: Diagnosis not present

## 2022-07-26 DIAGNOSIS — R059 Cough, unspecified: Secondary | ICD-10-CM | POA: Diagnosis not present

## 2022-07-26 DIAGNOSIS — W19XXXD Unspecified fall, subsequent encounter: Secondary | ICD-10-CM | POA: Diagnosis not present

## 2022-07-26 DIAGNOSIS — S32592D Other specified fracture of left pubis, subsequent encounter for fracture with routine healing: Secondary | ICD-10-CM | POA: Diagnosis not present

## 2022-07-26 DIAGNOSIS — S32000D Wedge compression fracture of unspecified lumbar vertebra, subsequent encounter for fracture with routine healing: Secondary | ICD-10-CM | POA: Diagnosis not present

## 2022-07-26 DIAGNOSIS — K5900 Constipation, unspecified: Secondary | ICD-10-CM | POA: Diagnosis not present

## 2022-07-27 DIAGNOSIS — E785 Hyperlipidemia, unspecified: Secondary | ICD-10-CM | POA: Diagnosis not present

## 2022-07-27 DIAGNOSIS — M810 Age-related osteoporosis without current pathological fracture: Secondary | ICD-10-CM | POA: Diagnosis not present

## 2022-07-27 DIAGNOSIS — E113313 Type 2 diabetes mellitus with moderate nonproliferative diabetic retinopathy with macular edema, bilateral: Secondary | ICD-10-CM | POA: Diagnosis not present

## 2022-07-27 DIAGNOSIS — J449 Chronic obstructive pulmonary disease, unspecified: Secondary | ICD-10-CM | POA: Diagnosis not present

## 2022-07-27 DIAGNOSIS — N1831 Chronic kidney disease, stage 3a: Secondary | ICD-10-CM | POA: Diagnosis not present

## 2022-07-27 DIAGNOSIS — I129 Hypertensive chronic kidney disease with stage 1 through stage 4 chronic kidney disease, or unspecified chronic kidney disease: Secondary | ICD-10-CM | POA: Diagnosis not present

## 2022-07-27 DIAGNOSIS — J9601 Acute respiratory failure with hypoxia: Secondary | ICD-10-CM | POA: Diagnosis not present

## 2022-07-27 DIAGNOSIS — F331 Major depressive disorder, recurrent, moderate: Secondary | ICD-10-CM | POA: Diagnosis not present

## 2022-07-27 DIAGNOSIS — S32592D Other specified fracture of left pubis, subsequent encounter for fracture with routine healing: Secondary | ICD-10-CM | POA: Diagnosis not present

## 2022-07-27 DIAGNOSIS — S32000D Wedge compression fracture of unspecified lumbar vertebra, subsequent encounter for fracture with routine healing: Secondary | ICD-10-CM | POA: Diagnosis not present

## 2022-07-28 DIAGNOSIS — S32000D Wedge compression fracture of unspecified lumbar vertebra, subsequent encounter for fracture with routine healing: Secondary | ICD-10-CM | POA: Diagnosis not present

## 2022-07-28 DIAGNOSIS — Z794 Long term (current) use of insulin: Secondary | ICD-10-CM | POA: Diagnosis not present

## 2022-07-28 DIAGNOSIS — E113313 Type 2 diabetes mellitus with moderate nonproliferative diabetic retinopathy with macular edema, bilateral: Secondary | ICD-10-CM | POA: Diagnosis not present

## 2022-07-28 DIAGNOSIS — J189 Pneumonia, unspecified organism: Secondary | ICD-10-CM | POA: Diagnosis not present

## 2022-07-28 DIAGNOSIS — S32592D Other specified fracture of left pubis, subsequent encounter for fracture with routine healing: Secondary | ICD-10-CM | POA: Diagnosis not present

## 2022-07-28 DIAGNOSIS — I129 Hypertensive chronic kidney disease with stage 1 through stage 4 chronic kidney disease, or unspecified chronic kidney disease: Secondary | ICD-10-CM | POA: Diagnosis not present

## 2022-07-28 DIAGNOSIS — N1831 Chronic kidney disease, stage 3a: Secondary | ICD-10-CM | POA: Diagnosis not present

## 2022-07-28 DIAGNOSIS — Z7984 Long term (current) use of oral hypoglycemic drugs: Secondary | ICD-10-CM | POA: Diagnosis not present

## 2022-07-28 DIAGNOSIS — R059 Cough, unspecified: Secondary | ICD-10-CM | POA: Diagnosis not present

## 2022-07-30 DIAGNOSIS — Z794 Long term (current) use of insulin: Secondary | ICD-10-CM | POA: Diagnosis not present

## 2022-07-30 DIAGNOSIS — F331 Major depressive disorder, recurrent, moderate: Secondary | ICD-10-CM | POA: Diagnosis not present

## 2022-07-30 DIAGNOSIS — I129 Hypertensive chronic kidney disease with stage 1 through stage 4 chronic kidney disease, or unspecified chronic kidney disease: Secondary | ICD-10-CM | POA: Diagnosis not present

## 2022-07-30 DIAGNOSIS — Z7984 Long term (current) use of oral hypoglycemic drugs: Secondary | ICD-10-CM | POA: Diagnosis not present

## 2022-07-30 DIAGNOSIS — S32592D Other specified fracture of left pubis, subsequent encounter for fracture with routine healing: Secondary | ICD-10-CM | POA: Diagnosis not present

## 2022-07-30 DIAGNOSIS — S32000D Wedge compression fracture of unspecified lumbar vertebra, subsequent encounter for fracture with routine healing: Secondary | ICD-10-CM | POA: Diagnosis not present

## 2022-07-30 DIAGNOSIS — R059 Cough, unspecified: Secondary | ICD-10-CM | POA: Diagnosis not present

## 2022-07-30 DIAGNOSIS — E113313 Type 2 diabetes mellitus with moderate nonproliferative diabetic retinopathy with macular edema, bilateral: Secondary | ICD-10-CM | POA: Diagnosis not present

## 2022-07-30 DIAGNOSIS — J189 Pneumonia, unspecified organism: Secondary | ICD-10-CM | POA: Diagnosis not present

## 2022-07-30 DIAGNOSIS — J449 Chronic obstructive pulmonary disease, unspecified: Secondary | ICD-10-CM | POA: Diagnosis not present

## 2022-08-02 ENCOUNTER — Ambulatory Visit: Payer: PPO | Admitting: Family Medicine

## 2022-08-02 DIAGNOSIS — S32000D Wedge compression fracture of unspecified lumbar vertebra, subsequent encounter for fracture with routine healing: Secondary | ICD-10-CM | POA: Diagnosis not present

## 2022-08-02 DIAGNOSIS — F0393 Unspecified dementia, unspecified severity, with mood disturbance: Secondary | ICD-10-CM | POA: Diagnosis not present

## 2022-08-02 DIAGNOSIS — G309 Alzheimer's disease, unspecified: Secondary | ICD-10-CM | POA: Diagnosis not present

## 2022-08-02 DIAGNOSIS — S32592D Other specified fracture of left pubis, subsequent encounter for fracture with routine healing: Secondary | ICD-10-CM | POA: Diagnosis not present

## 2022-08-02 DIAGNOSIS — J189 Pneumonia, unspecified organism: Secondary | ICD-10-CM | POA: Diagnosis not present

## 2022-08-02 DIAGNOSIS — E113313 Type 2 diabetes mellitus with moderate nonproliferative diabetic retinopathy with macular edema, bilateral: Secondary | ICD-10-CM | POA: Diagnosis not present

## 2022-08-02 DIAGNOSIS — N1831 Chronic kidney disease, stage 3a: Secondary | ICD-10-CM | POA: Diagnosis not present

## 2022-08-02 DIAGNOSIS — Z794 Long term (current) use of insulin: Secondary | ICD-10-CM | POA: Diagnosis not present

## 2022-08-02 DIAGNOSIS — Z7984 Long term (current) use of oral hypoglycemic drugs: Secondary | ICD-10-CM | POA: Diagnosis not present

## 2022-08-03 DIAGNOSIS — E119 Type 2 diabetes mellitus without complications: Secondary | ICD-10-CM | POA: Diagnosis not present

## 2022-08-03 DIAGNOSIS — R102 Pelvic and perineal pain: Secondary | ICD-10-CM | POA: Diagnosis not present

## 2022-08-03 DIAGNOSIS — Z794 Long term (current) use of insulin: Secondary | ICD-10-CM | POA: Diagnosis not present

## 2022-08-03 DIAGNOSIS — S32592A Other specified fracture of left pubis, initial encounter for closed fracture: Secondary | ICD-10-CM | POA: Diagnosis not present

## 2022-08-04 DIAGNOSIS — E113313 Type 2 diabetes mellitus with moderate nonproliferative diabetic retinopathy with macular edema, bilateral: Secondary | ICD-10-CM | POA: Diagnosis not present

## 2022-08-04 DIAGNOSIS — R059 Cough, unspecified: Secondary | ICD-10-CM | POA: Diagnosis not present

## 2022-08-04 DIAGNOSIS — J189 Pneumonia, unspecified organism: Secondary | ICD-10-CM | POA: Diagnosis not present

## 2022-08-04 DIAGNOSIS — S32592D Other specified fracture of left pubis, subsequent encounter for fracture with routine healing: Secondary | ICD-10-CM | POA: Diagnosis not present

## 2022-08-04 DIAGNOSIS — F331 Major depressive disorder, recurrent, moderate: Secondary | ICD-10-CM | POA: Diagnosis not present

## 2022-08-04 DIAGNOSIS — I129 Hypertensive chronic kidney disease with stage 1 through stage 4 chronic kidney disease, or unspecified chronic kidney disease: Secondary | ICD-10-CM | POA: Diagnosis not present

## 2022-08-04 DIAGNOSIS — Z794 Long term (current) use of insulin: Secondary | ICD-10-CM | POA: Diagnosis not present

## 2022-08-04 DIAGNOSIS — S32000D Wedge compression fracture of unspecified lumbar vertebra, subsequent encounter for fracture with routine healing: Secondary | ICD-10-CM | POA: Diagnosis not present

## 2022-08-04 DIAGNOSIS — Z7984 Long term (current) use of oral hypoglycemic drugs: Secondary | ICD-10-CM | POA: Diagnosis not present

## 2022-08-09 DIAGNOSIS — E113313 Type 2 diabetes mellitus with moderate nonproliferative diabetic retinopathy with macular edema, bilateral: Secondary | ICD-10-CM | POA: Diagnosis not present

## 2022-08-09 DIAGNOSIS — S32592D Other specified fracture of left pubis, subsequent encounter for fracture with routine healing: Secondary | ICD-10-CM | POA: Diagnosis not present

## 2022-08-09 DIAGNOSIS — Z7984 Long term (current) use of oral hypoglycemic drugs: Secondary | ICD-10-CM | POA: Diagnosis not present

## 2022-08-09 DIAGNOSIS — Z794 Long term (current) use of insulin: Secondary | ICD-10-CM | POA: Diagnosis not present

## 2022-08-09 DIAGNOSIS — N1831 Chronic kidney disease, stage 3a: Secondary | ICD-10-CM | POA: Diagnosis not present

## 2022-08-09 DIAGNOSIS — F0393 Unspecified dementia, unspecified severity, with mood disturbance: Secondary | ICD-10-CM | POA: Diagnosis not present

## 2022-08-09 DIAGNOSIS — G309 Alzheimer's disease, unspecified: Secondary | ICD-10-CM | POA: Diagnosis not present

## 2022-08-09 DIAGNOSIS — R059 Cough, unspecified: Secondary | ICD-10-CM | POA: Diagnosis not present

## 2022-08-09 DIAGNOSIS — I129 Hypertensive chronic kidney disease with stage 1 through stage 4 chronic kidney disease, or unspecified chronic kidney disease: Secondary | ICD-10-CM | POA: Diagnosis not present

## 2022-08-09 DIAGNOSIS — S32000D Wedge compression fracture of unspecified lumbar vertebra, subsequent encounter for fracture with routine healing: Secondary | ICD-10-CM | POA: Diagnosis not present

## 2022-08-09 DIAGNOSIS — J189 Pneumonia, unspecified organism: Secondary | ICD-10-CM | POA: Diagnosis not present

## 2022-08-09 DIAGNOSIS — F331 Major depressive disorder, recurrent, moderate: Secondary | ICD-10-CM | POA: Diagnosis not present

## 2022-08-11 DIAGNOSIS — G309 Alzheimer's disease, unspecified: Secondary | ICD-10-CM | POA: Diagnosis not present

## 2022-08-11 DIAGNOSIS — J439 Emphysema, unspecified: Secondary | ICD-10-CM | POA: Diagnosis not present

## 2022-08-11 DIAGNOSIS — Z8616 Personal history of COVID-19: Secondary | ICD-10-CM | POA: Diagnosis not present

## 2022-08-11 DIAGNOSIS — M1990 Unspecified osteoarthritis, unspecified site: Secondary | ICD-10-CM | POA: Diagnosis not present

## 2022-08-11 DIAGNOSIS — I129 Hypertensive chronic kidney disease with stage 1 through stage 4 chronic kidney disease, or unspecified chronic kidney disease: Secondary | ICD-10-CM | POA: Diagnosis not present

## 2022-08-11 DIAGNOSIS — M8008XD Age-related osteoporosis with current pathological fracture, vertebra(e), subsequent encounter for fracture with routine healing: Secondary | ICD-10-CM | POA: Diagnosis not present

## 2022-08-11 DIAGNOSIS — I350 Nonrheumatic aortic (valve) stenosis: Secondary | ICD-10-CM | POA: Diagnosis not present

## 2022-08-11 DIAGNOSIS — Z7984 Long term (current) use of oral hypoglycemic drugs: Secondary | ICD-10-CM | POA: Diagnosis not present

## 2022-08-11 DIAGNOSIS — J189 Pneumonia, unspecified organism: Secondary | ICD-10-CM | POA: Diagnosis not present

## 2022-08-11 DIAGNOSIS — J440 Chronic obstructive pulmonary disease with acute lower respiratory infection: Secondary | ICD-10-CM | POA: Diagnosis not present

## 2022-08-11 DIAGNOSIS — M800AXD Age-related osteoporosis with current pathological fracture, other site, subsequent encounter for fracture with routine healing: Secondary | ICD-10-CM | POA: Diagnosis not present

## 2022-08-11 DIAGNOSIS — F331 Major depressive disorder, recurrent, moderate: Secondary | ICD-10-CM | POA: Diagnosis not present

## 2022-08-11 DIAGNOSIS — Z7951 Long term (current) use of inhaled steroids: Secondary | ICD-10-CM | POA: Diagnosis not present

## 2022-08-11 DIAGNOSIS — D3500 Benign neoplasm of unspecified adrenal gland: Secondary | ICD-10-CM | POA: Diagnosis not present

## 2022-08-11 DIAGNOSIS — K5900 Constipation, unspecified: Secondary | ICD-10-CM | POA: Diagnosis not present

## 2022-08-11 DIAGNOSIS — Z794 Long term (current) use of insulin: Secondary | ICD-10-CM | POA: Diagnosis not present

## 2022-08-11 DIAGNOSIS — H9190 Unspecified hearing loss, unspecified ear: Secondary | ICD-10-CM | POA: Diagnosis not present

## 2022-08-11 DIAGNOSIS — Z8673 Personal history of transient ischemic attack (TIA), and cerebral infarction without residual deficits: Secondary | ICD-10-CM | POA: Diagnosis not present

## 2022-08-11 DIAGNOSIS — H3530 Unspecified macular degeneration: Secondary | ICD-10-CM | POA: Diagnosis not present

## 2022-08-11 DIAGNOSIS — E1122 Type 2 diabetes mellitus with diabetic chronic kidney disease: Secondary | ICD-10-CM | POA: Diagnosis not present

## 2022-08-11 DIAGNOSIS — E113313 Type 2 diabetes mellitus with moderate nonproliferative diabetic retinopathy with macular edema, bilateral: Secondary | ICD-10-CM | POA: Diagnosis not present

## 2022-08-11 DIAGNOSIS — E7800 Pure hypercholesterolemia, unspecified: Secondary | ICD-10-CM | POA: Diagnosis not present

## 2022-08-11 DIAGNOSIS — F0283 Dementia in other diseases classified elsewhere, unspecified severity, with mood disturbance: Secondary | ICD-10-CM | POA: Diagnosis not present

## 2022-08-11 DIAGNOSIS — N1831 Chronic kidney disease, stage 3a: Secondary | ICD-10-CM | POA: Diagnosis not present

## 2022-08-17 ENCOUNTER — Ambulatory Visit (INDEPENDENT_AMBULATORY_CARE_PROVIDER_SITE_OTHER): Payer: PPO | Admitting: Family Medicine

## 2022-08-17 ENCOUNTER — Encounter: Payer: Self-pay | Admitting: Family Medicine

## 2022-08-17 VITALS — BP 160/80 | HR 85 | Temp 97.7°F | Ht 59.0 in | Wt 96.1 lb

## 2022-08-17 DIAGNOSIS — I129 Hypertensive chronic kidney disease with stage 1 through stage 4 chronic kidney disease, or unspecified chronic kidney disease: Secondary | ICD-10-CM

## 2022-08-17 DIAGNOSIS — N183 Chronic kidney disease, stage 3 unspecified: Secondary | ICD-10-CM | POA: Diagnosis not present

## 2022-08-17 DIAGNOSIS — E1122 Type 2 diabetes mellitus with diabetic chronic kidney disease: Secondary | ICD-10-CM | POA: Diagnosis not present

## 2022-08-17 DIAGNOSIS — E1169 Type 2 diabetes mellitus with other specified complication: Secondary | ICD-10-CM | POA: Diagnosis not present

## 2022-08-17 DIAGNOSIS — R062 Wheezing: Secondary | ICD-10-CM | POA: Diagnosis not present

## 2022-08-17 DIAGNOSIS — E785 Hyperlipidemia, unspecified: Secondary | ICD-10-CM | POA: Diagnosis not present

## 2022-08-17 DIAGNOSIS — Z794 Long term (current) use of insulin: Secondary | ICD-10-CM

## 2022-08-17 LAB — BAYER DCA HB A1C WAIVED: HB A1C (BAYER DCA - WAIVED): 7.5 % — ABNORMAL HIGH (ref 4.8–5.6)

## 2022-08-17 MED ORDER — LEVEMIR FLEXTOUCH 100 UNIT/ML ~~LOC~~ SOPN: 8.0000 [IU] | PEN_INJECTOR | Freq: Every day | SUBCUTANEOUS | Status: AC

## 2022-08-17 MED ORDER — ALBUTEROL SULFATE (2.5 MG/3ML) 0.083% IN NEBU
2.5000 mg | INHALATION_SOLUTION | Freq: Once | RESPIRATORY_TRACT | Status: AC
Start: 2022-08-17 — End: 2022-08-17
  Administered 2022-08-17: 2.5 mg via RESPIRATORY_TRACT

## 2022-08-17 NOTE — Progress Notes (Signed)
BP (!) 160/80   Pulse 85   Temp 97.7 F (36.5 C) (Oral)   Ht 4\' 11"  (1.499 m)   Wt 96 lb 1.6 oz (43.6 kg)   SpO2 97%   BMI 19.41 kg/m    Subjective:    Patient ID: Angela Neal, female    DOB: 1921/10/16, 89 y.o.   MRN: [id number]  HPI: Angela Neal is a 89 y.o. female  Chief Complaint  Patient presents with   Diabetes   Hyperlipidemia   Hypertension   Pneumonia   Patient had a previous fall in July 2024 and encountered a fracture of her pelvic bone, went to rehab and developed pneumonia, was given 10 day course of Doxycycline. She is now using a walker and wheelchair for ambulation.   HYPERTENSION / HYPERLIPIDEMIA She is taking Losartan 100 mg, Amlodipine 5mg , and Atorvastatin 40mg  Satisfied with current treatment? yes Duration of hypertension: chronic BP monitoring frequency: weekly BP range: 140s-150s/80s BP medication side effects: No Duration of hyperlipidemia: chronic Cholesterol medication side effects: no Cholesterol supplements: None  Medication compliance: excellent compliance Aspirin: no Recent stressors: no Recurrent headaches: no Visual changes: no Palpitations: no Dyspnea: no Chest pain: no Lower extremity edema: no Dizzy/lightheaded: no   DIABETES Previous A1C in April 2024 7.7%, A1C today 7.5%. She is taking Januvia 100 mg and Novolog 4 units with sliding scale TID before meals and Levemir 8 units nightly with sliding scale. Appetite and water intake has been low, daughter is checking BG in the morning.  Hypoglycemic episodes:no Polydipsia/polyuria: no Visual disturbance: no Chest pain: no Paresthesias: no Glucose Monitoring: yes  Accucheck frequency: Daily  Fasting glucose: 60-100 Taking Insulin?: yes  Fast acting insulin: Novolog 4 units  Long acting insulin:Levemir 8 units  Blood Pressure Monitoring: weekly Retinal Examination:  Not Up to date; daughter will schedule Foot Exam: Up to Date Diabetic Education:  Completed Pneumovax: Up to Date Influenza: W Aspirin: no  Relevant past medical, surgical, family and social history reviewed and updated as indicated. Interim medical history since our last visit reviewed. Allergies and medications reviewed and updated.  Review of Systems  Eyes:  Negative for visual disturbance.  Respiratory:  Positive for cough and wheezing. Negative for shortness of breath.   Cardiovascular:  Negative for chest pain, palpitations and leg swelling.  Endocrine: Negative for polydipsia, polyphagia and polyuria.  Genitourinary:  Negative for frequency.  Neurological:  Negative for dizziness, light-headedness, numbness and headaches.    Per HPI unless specifically indicated above     Objective:    BP (!) 160/80   Pulse 85   Temp 97.7 F (36.5 C) (Oral)   Ht 4\' 11"  (1.499 m)   Wt 96 lb 1.6 oz (43.6 kg)   SpO2 97%   BMI 19.41 kg/m   Wt Readings from Last 3 Encounters:  08/17/22 96 lb 1.6 oz (43.6 kg)  04/20/22 94 lb 4.8 oz (42.8 kg)  01/19/22 92 lb 6.4 oz (41.9 kg)    Physical Exam Vitals and nursing note reviewed.  Constitutional:      General: She is awake. She is not in acute distress.    Appearance: Normal appearance. She is well-developed and well-groomed. She is not ill-appearing.  HENT:     Head: Normocephalic and atraumatic.     Right Ear: Hearing and external ear normal. No drainage.     Left Ear: Hearing and external ear normal. No drainage.     Nose: Nose normal.  Eyes:  General: Lids are normal.        Right eye: No discharge.        Left eye: No discharge.     Conjunctiva/sclera: Conjunctivae normal.  Cardiovascular:     Rate and Rhythm: Normal rate and regular rhythm.     Pulses:          Radial pulses are 2+ on the right side and 2+ on the left side.       Posterior tibial pulses are 2+ on the right side and 2+ on the left side.     Heart sounds: S1 normal and S2 normal. Murmur heard.     No gallop.  Pulmonary:     Effort:  Pulmonary effort is normal. No accessory muscle usage or respiratory distress.     Breath sounds: Decreased air movement present. Wheezing present.  Musculoskeletal:        General: Normal range of motion.     Cervical back: Full passive range of motion without pain and normal range of motion.     Right lower leg: No edema.     Left lower leg: No edema.  Skin:    General: Skin is warm and dry.     Capillary Refill: Capillary refill takes less than 2 seconds.  Neurological:     Mental Status: She is alert and oriented to person, place, and time.  Psychiatric:        Attention and Perception: Attention normal.        Mood and Affect: Mood normal.        Speech: Speech normal.        Behavior: Behavior normal. Behavior is cooperative.        Thought Content: Thought content normal.     Results for orders placed or performed in visit on 08/17/22  CBC w/Diff  Result Value Ref Range   WBC 6.9 3.4 - 10.8 x10E3/uL   RBC 4.21 3.77 - 5.28 x10E6/uL   Hemoglobin 13.3 11.1 - 15.9 g/dL   Hematocrit 16.1 09.6 - 46.6 %   MCV 95 79 - 97 fL   MCH 31.6 26.6 - 33.0 pg   MCHC 33.2 31.5 - 35.7 g/dL   RDW 04.5 40.9 - 81.1 %   Platelets 222 150 - 450 x10E3/uL   Neutrophils 65 Not Estab. %   Lymphs 19 Not Estab. %   Monocytes 11 Not Estab. %   Eos 4 Not Estab. %   Basos 1 Not Estab. %   Neutrophils Absolute 4.5 1.4 - 7.0 x10E3/uL   Lymphocytes Absolute 1.3 0.7 - 3.1 x10E3/uL   Monocytes Absolute 0.7 0.1 - 0.9 x10E3/uL   EOS (ABSOLUTE) 0.3 0.0 - 0.4 x10E3/uL   Basophils Absolute 0.1 0.0 - 0.2 x10E3/uL   Immature Granulocytes 0 Not Estab. %   Immature Grans (Abs) 0.0 0.0 - 0.1 x10E3/uL  Bayer DCA Hb A1c Waived  Result Value Ref Range   HB A1C (BAYER DCA - WAIVED) 7.5 (H) 4.8 - 5.6 %  Comp Met (CMET)  Result Value Ref Range   Glucose 349 (H) 70 - 99 mg/dL   BUN 17 10 - 36 mg/dL   Creatinine, Ser 9.14 (H) 0.57 - 1.00 mg/dL   eGFR 44 (L) >78 GN/FAO/1.30   BUN/Creatinine Ratio 15 12 - 28    Sodium 139 134 - 144 mmol/L   Potassium 4.6 3.5 - 5.2 mmol/L   Chloride 98 96 - 106 mmol/L   CO2 25 20 - 29 mmol/L  Calcium 9.5 8.7 - 10.3 mg/dL   Total Protein 7.6 6.0 - 8.5 g/dL   Albumin 4.2 3.6 - 4.6 g/dL   Globulin, Total 3.4 1.5 - 4.5 g/dL   Bilirubin Total 0.2 0.0 - 1.2 mg/dL   Alkaline Phosphatase 199 (H) 44 - 121 IU/L   AST 39 0 - 40 IU/L   ALT 37 (H) 0 - 32 IU/L      Assessment & Plan:   Problem List Items Addressed This Visit     Type 2 diabetes mellitus with renal complication (HCC)    Chronic, stable. Last A1C 7.7. Rechecked A1C today. Continue Januvia 100mg  and Novolog 4 units sliding scale TID before meals, and Lvemir 8 units at bedtime. Continue to follow with endocrinology. Will recheck A1C in 3 months.       Relevant Medications   LEVEMIR FLEXTOUCH 100 UNIT/ML FlexTouch Pen   Other Relevant Orders   Bayer DCA Hb A1c Waived (Completed)   Comp Met (CMET) (Completed)   Benign hypertensive renal disease    Chronic, stable. BP averages 140-160/80s. Continue current regimen of Losartan 100 MG and Amlodipine 5 MG. Continue to monitor. Call with any concerns. Labs done today.       Relevant Orders   CBC w/Diff (Completed)   Comp Met (CMET) (Completed)   Hyperlipidemia associated with type 2 diabetes mellitus (HCC)    Under good control on current regimen of Atorvastatin 40 MG.  Continue current regimen. Continue to monitor. Call with any concerns. Return in 3 months.      Relevant Medications   LEVEMIR FLEXTOUCH 100 UNIT/ML FlexTouch Pen   Other Visit Diagnoses     Wheezing    -  Primary   Acute, improved. Albuterol nebulizer given today with improvement. Continue Tiotropium inhaler PRN. Chest xray ordered to ensure recent pneumonia resolved.   Relevant Medications   albuterol (PROVENTIL) (2.5 MG/3ML) 0.083% nebulizer solution 2.5 mg (Completed)   Other Relevant Orders   DG Chest 2 View        Follow up plan: Return in about 3 months (around  11/17/2022) for Follow up DM2, HTN/HLD.

## 2022-08-17 NOTE — Assessment & Plan Note (Addendum)
Chronic, stable. Last A1C 7.7. Rechecked A1C today. Continue Januvia 100mg  and Novolog 4 units sliding scale TID before meals, and Lvemir 8 units at bedtime. Continue to follow with endocrinology. Will recheck A1C in 3 months.

## 2022-08-17 NOTE — Assessment & Plan Note (Addendum)
Chronic, stable. BP averages 140-160/80s. Continue current regimen of Losartan 100 MG and Amlodipine 5 MG. Continue to monitor. Call with any concerns. Labs done today.

## 2022-08-17 NOTE — Assessment & Plan Note (Addendum)
Under good control on current regimen of Atorvastatin 40 MG.  Continue current regimen. Continue to monitor. Call with any concerns. Return in 3 months.

## 2022-08-20 DIAGNOSIS — M6281 Muscle weakness (generalized): Secondary | ICD-10-CM | POA: Diagnosis not present

## 2022-08-20 DIAGNOSIS — M8008XD Age-related osteoporosis with current pathological fracture, vertebra(e), subsequent encounter for fracture with routine healing: Secondary | ICD-10-CM | POA: Diagnosis not present

## 2022-08-20 DIAGNOSIS — R262 Difficulty in walking, not elsewhere classified: Secondary | ICD-10-CM | POA: Diagnosis not present

## 2022-08-20 DIAGNOSIS — J189 Pneumonia, unspecified organism: Secondary | ICD-10-CM | POA: Diagnosis not present

## 2022-08-20 DIAGNOSIS — Z9181 History of falling: Secondary | ICD-10-CM | POA: Diagnosis not present

## 2022-08-20 DIAGNOSIS — M800AXD Age-related osteoporosis with current pathological fracture, other site, subsequent encounter for fracture with routine healing: Secondary | ICD-10-CM | POA: Diagnosis not present

## 2022-08-30 ENCOUNTER — Other Ambulatory Visit: Payer: Self-pay | Admitting: Family Medicine

## 2022-08-31 DIAGNOSIS — S32592A Other specified fracture of left pubis, initial encounter for closed fracture: Secondary | ICD-10-CM | POA: Diagnosis not present

## 2022-08-31 NOTE — Telephone Encounter (Signed)
Requested Prescriptions  Refused Prescriptions Disp Refills   losartan (COZAAR) 100 MG tablet [Pharmacy Med Name: LOSARTAN POTASSIUM 100 MG TAB] 90 tablet 1    Sig: TAKE 1 TABLET BY MOUTH EVERY DAY     Cardiovascular:  Angiotensin Receptor Blockers Failed - 08/30/2022 12:05 PM      Failed - Cr in normal range and within 180 days    Creatinine  Date Value Ref Range Status  04/26/2014 0.82 mg/dL Final    Comment:    9.14-7.82 NOTE: New Reference Range  03/12/14    Creatinine, Ser  Date Value Ref Range Status  08/17/2022 1.13 (H) 0.57 - 1.00 mg/dL Final         Failed - Last BP in normal range    BP Readings from Last 1 Encounters:  08/17/22 (!) 160/80         Passed - K in normal range and within 180 days    Potassium  Date Value Ref Range Status  08/17/2022 4.6 3.5 - 5.2 mmol/L Final  04/26/2014 3.8 mmol/L Final    Comment:    3.5-5.1 NOTE: New Reference Range  03/12/14          Passed - Patient is not pregnant      Passed - Valid encounter within last 6 months    Recent Outpatient Visits           2 weeks ago Wheezing   [place] [facility] Pearley, Sherran Needs, NP   4 months ago Routine general medical examination at a health care facility   [facility] [place], Megan P, DO   7 months ago Encounter for annual wellness exam in Medicare patient   [place] [facility] [place], [place], DO   8 months ago Left leg pain   [place] [facility] [place], [place], DO   11 months ago Need for influenza vaccination   [place] [facility] Dorcas Carrow, DO       Future Appointments             In 2 months Laural Benes, Oralia Rud, DO [place] [facility], [facility]

## 2022-09-11 DIAGNOSIS — E1122 Type 2 diabetes mellitus with diabetic chronic kidney disease: Secondary | ICD-10-CM | POA: Diagnosis not present

## 2022-09-14 DIAGNOSIS — E1129 Type 2 diabetes mellitus with other diabetic kidney complication: Secondary | ICD-10-CM | POA: Diagnosis not present

## 2022-09-14 DIAGNOSIS — Z794 Long term (current) use of insulin: Secondary | ICD-10-CM | POA: Diagnosis not present

## 2022-09-14 DIAGNOSIS — R809 Proteinuria, unspecified: Secondary | ICD-10-CM | POA: Diagnosis not present

## 2022-09-16 ENCOUNTER — Other Ambulatory Visit: Payer: Self-pay | Admitting: Family Medicine

## 2022-09-17 NOTE — Telephone Encounter (Signed)
Requested Prescriptions  Pending Prescriptions Disp Refills   mirtazapine (REMERON) 30 MG tablet [Pharmacy Med Name: MIRTAZAPINE 30 MG TABLET] 90 tablet 0    Sig: TAKE 1 TABLET BY MOUTH AT BEDTIME.     Psychiatry: Antidepressants - mirtazapine Passed - 09/16/2022  8:08 PM      Passed - Completed PHQ-2 or PHQ-9 in the last 360 days      Passed - Valid encounter within last 6 months    Recent Outpatient Visits           1 month ago Wheezing   [place] [facility] Pearley, Sherran Needs, NP   5 months ago Routine general medical examination at a health care facility   [facility], Megan P, DO   8 months ago Encounter for annual wellness exam in Medicare patient   [place] [facility] [place], [place] P, DO   8 months ago Left leg pain   [place] [facility] [place], Megan P, DO   11 months ago Need for influenza vaccination   [place] [facility] [place], Megan P, DO       Future Appointments             In 2 months Johnson, Megan P, DO [place] [facility], [facility]             atorvastatin (LIPITOR) 40 MG tablet [Pharmacy Med Name: ATORVASTATIN 40 MG TABLET] 90 tablet 0    Sig: TAKE 1 TABLET BY MOUTH EVERY DAY     Cardiovascular:  Antilipid - Statins Failed - 09/16/2022  8:08 PM      Failed - Lipid Panel in normal range within the last 12 months    Cholesterol, Total  Date Value Ref Range Status  04/20/2022 125 100 - 199 mg/dL Final   LDL Chol Calc (NIH)  Date Value Ref Range Status  04/20/2022 54 0 - 99 mg/dL Final   HDL  Date Value Ref Range Status  04/20/2022 50 >39 mg/dL Final   Triglycerides  Date Value Ref Range Status  04/20/2022 114 0 - 149 mg/dL Final         Passed - Patient is not pregnant      Passed - Valid encounter within last 12 months    Recent Outpatient Visits           1 month ago Wheezing   [place] [facility]  Pearley, Sherran Needs, NP   5 months ago Routine general medical examination at a health care facility   [facility], [facility] P, DO   8 months ago Encounter for annual wellness exam in Medicare patient   [place] [facility] [place], [place] P, DO   8 months ago Left leg pain   [place] [facility] [place], Megan P, DO   11 months ago Need for influenza vaccination   [place] [facility] [place], Vazquez, DO       Future Appointments             In 2 months Johnson, Megan P, DO [place] [facility], [facility]             sertraline (ZOLOFT) 50 MG tablet [Pharmacy Med Name: SERTRALINE HCL 50 MG TABLET] 135 tablet 0    Sig: TAKE 1.5 TABLETS (75 MG TOTAL) BY MOUTH DAILY.     Psychiatry:  Antidepressants -  SSRI - sertraline Failed - 09/16/2022  8:08 PM      Failed - ALT in normal range and within 360 days    ALT  Date Value Ref Range Status  08/17/2022 37 (H) 0 - 32 IU/L Final         Passed - AST in normal range and within 360 days    AST  Date Value Ref Range Status  08/17/2022 39 0 - 40 IU/L Final         Passed - Completed PHQ-2 or PHQ-9 in the last 360 days      Passed - Valid encounter within last 6 months    Recent Outpatient Visits           1 month ago Wheezing   [place] [facility] Pearley, Sherran Needs, NP   5 months ago Routine general medical examination at a health care facility   [facility], [facility] P, DO   8 months ago Encounter for annual wellness exam in Medicare patient   [facility] [place], [place] P, DO   8 months ago Left leg pain   [place] [facility] [place], Megan P, DO   11 months ago Need for influenza vaccination   [place] [facility] [place], Megan P, DO       Future Appointments             In 2 months Johnson, Megan P, DO [place]  [facility], [facility]             montelukast (SINGULAIR) 10 MG tablet [Pharmacy Med Name: MONTELUKAST SOD 10 MG TABLET] 90 tablet 0    Sig: TAKE 1 TABLET (10 MG TOTAL) BY MOUTH AT BEDTIME. TAKE 1 TABLET(10 MG) BY MOUTH NIGHTLY     Pulmonology:  Leukotriene Inhibitors Passed - 09/16/2022  8:08 PM      Passed - Valid encounter within last 12 months    Recent Outpatient Visits           1 month ago Wheezing   [place] [facility] Pearley, Sherran Needs, NP   5 months ago Routine general medical examination at a health care facility   [place] [facility], Megan P, DO   8 months ago Encounter for annual wellness exam in Medicare patient   [place] [facility] [place], [place] P, DO   8 months ago Left leg pain   [place] [facility] [place], Megan P, DO   11 months ago Need for influenza vaccination   [place] [facility] Dorcas Carrow, DO       Future Appointments             In 2 months Laural Benes, Megan P, DO [place] [facility], [facility]             losartan (COZAAR) 100 MG tablet [Pharmacy Med Name: LOSARTAN POTASSIUM 100 MG TAB] 90 tablet 0    Sig: TAKE 1 TABLET BY MOUTH EVERY DAY     Cardiovascular:  Angiotensin Receptor Blockers Failed - 09/16/2022  8:08 PM      Failed - Cr in normal range and within 180 days    Creatinine  Date Value Ref Range Status  04/26/2014 0.82 mg/dL Final    Comment:    2.13-0.86 NOTE: New Reference Range  03/12/14    Creatinine, Ser  Date Value Ref Range  Status  08/17/2022 1.13 (H) 0.57 - 1.00 mg/dL Final         Failed - Last BP in normal range    BP Readings from Last 1 Encounters:  08/17/22 (!) 160/80         Passed - K in normal range and within 180 days    Potassium  Date Value Ref Range Status  08/17/2022 4.6 3.5 - 5.2 mmol/L Final  04/26/2014 3.8 mmol/L Final    Comment:    3.5-5.1 NOTE: New Reference  Range  03/12/14          Passed - Patient is not pregnant      Passed - Valid encounter within last 6 months    Recent Outpatient Visits           1 month ago Wheezing   [place] [facility] Pearley, Sherran Needs, NP   5 months ago Routine general medical examination at a health care facility   [facility], Megan P, DO   8 months ago Encounter for annual wellness exam in Medicare patient   [place] [facility] [place], [place] P, DO   8 months ago Left leg pain   [place] [facility] [place], Megan P, DO   11 months ago Need for influenza vaccination   [place] [facility] Dorcas Carrow, DO       Future Appointments             In 2 months Laural Benes, Oralia Rud, DO [place] [facility], [facility]

## 2022-09-21 DIAGNOSIS — R809 Proteinuria, unspecified: Secondary | ICD-10-CM | POA: Diagnosis not present

## 2022-09-21 DIAGNOSIS — Z8781 Personal history of (healed) traumatic fracture: Secondary | ICD-10-CM | POA: Diagnosis not present

## 2022-09-21 DIAGNOSIS — M810 Age-related osteoporosis without current pathological fracture: Secondary | ICD-10-CM | POA: Diagnosis not present

## 2022-09-21 DIAGNOSIS — N1831 Chronic kidney disease, stage 3a: Secondary | ICD-10-CM | POA: Diagnosis not present

## 2022-09-21 DIAGNOSIS — Z794 Long term (current) use of insulin: Secondary | ICD-10-CM | POA: Diagnosis not present

## 2022-09-21 DIAGNOSIS — E1129 Type 2 diabetes mellitus with other diabetic kidney complication: Secondary | ICD-10-CM | POA: Diagnosis not present

## 2022-09-21 DIAGNOSIS — E113313 Type 2 diabetes mellitus with moderate nonproliferative diabetic retinopathy with macular edema, bilateral: Secondary | ICD-10-CM | POA: Diagnosis not present

## 2022-09-21 DIAGNOSIS — E1122 Type 2 diabetes mellitus with diabetic chronic kidney disease: Secondary | ICD-10-CM | POA: Diagnosis not present

## 2022-10-11 DIAGNOSIS — E1122 Type 2 diabetes mellitus with diabetic chronic kidney disease: Secondary | ICD-10-CM | POA: Diagnosis not present

## 2022-11-11 DIAGNOSIS — E1122 Type 2 diabetes mellitus with diabetic chronic kidney disease: Secondary | ICD-10-CM | POA: Diagnosis not present

## 2022-11-17 ENCOUNTER — Ambulatory Visit (INDEPENDENT_AMBULATORY_CARE_PROVIDER_SITE_OTHER): Payer: PPO | Admitting: Family Medicine

## 2022-11-17 ENCOUNTER — Encounter: Payer: Self-pay | Admitting: Family Medicine

## 2022-11-17 VITALS — BP 147/82 | HR 84 | Ht 59.0 in | Wt 99.8 lb

## 2022-11-17 DIAGNOSIS — F339 Major depressive disorder, recurrent, unspecified: Secondary | ICD-10-CM | POA: Diagnosis not present

## 2022-11-17 DIAGNOSIS — E785 Hyperlipidemia, unspecified: Secondary | ICD-10-CM | POA: Diagnosis not present

## 2022-11-17 DIAGNOSIS — N183 Chronic kidney disease, stage 3 unspecified: Secondary | ICD-10-CM

## 2022-11-17 DIAGNOSIS — E1122 Type 2 diabetes mellitus with diabetic chronic kidney disease: Secondary | ICD-10-CM | POA: Diagnosis not present

## 2022-11-17 DIAGNOSIS — Z794 Long term (current) use of insulin: Secondary | ICD-10-CM

## 2022-11-17 DIAGNOSIS — Z23 Encounter for immunization: Secondary | ICD-10-CM | POA: Diagnosis not present

## 2022-11-17 DIAGNOSIS — I129 Hypertensive chronic kidney disease with stage 1 through stage 4 chronic kidney disease, or unspecified chronic kidney disease: Secondary | ICD-10-CM | POA: Diagnosis not present

## 2022-11-17 DIAGNOSIS — R051 Acute cough: Secondary | ICD-10-CM

## 2022-11-17 DIAGNOSIS — E1169 Type 2 diabetes mellitus with other specified complication: Secondary | ICD-10-CM

## 2022-11-17 LAB — BAYER DCA HB A1C WAIVED: HB A1C (BAYER DCA - WAIVED): 7.8 % — ABNORMAL HIGH (ref 4.8–5.6)

## 2022-11-17 MED ORDER — MONTELUKAST SODIUM 10 MG PO TABS: 10.0000 mg | ORAL_TABLET | Freq: Every day | ORAL | 1 refills | Status: DC

## 2022-11-17 MED ORDER — JANUVIA 100 MG PO TABS: 100.0000 mg | ORAL_TABLET | Freq: Every day | ORAL | 1 refills | Status: DC

## 2022-11-17 MED ORDER — LOSARTAN POTASSIUM 100 MG PO TABS: 100.0000 mg | ORAL_TABLET | Freq: Every day | ORAL | 1 refills | Status: DC

## 2022-11-17 MED ORDER — ATORVASTATIN CALCIUM 40 MG PO TABS: ORAL_TABLET | ORAL | 1 refills | Status: DC

## 2022-11-17 MED ORDER — MIRTAZAPINE 30 MG PO TABS: 30.0000 mg | ORAL_TABLET | Freq: Every day | ORAL | 1 refills | Status: DC

## 2022-11-17 MED ORDER — SERTRALINE HCL 50 MG PO TABS: 75.0000 mg | ORAL_TABLET | Freq: Every day | ORAL | 1 refills | Status: DC

## 2022-11-17 NOTE — Assessment & Plan Note (Signed)
Under good control on current regimen. Continue current regimen. Continue to monitor. Call with any concerns. Refills given. Labs drawn today.   

## 2022-11-17 NOTE — Assessment & Plan Note (Signed)
Stable with A1c of 7.8. Continue to follow with endocrinology. Call with any concerns.

## 2022-11-17 NOTE — Progress Notes (Unsigned)
BP (!) 147/82   Pulse 84   Ht 4\' 11"  (1.499 m)   Wt 99 lb 12.8 oz (45.3 kg)   SpO2 94%   BMI 20.16 kg/m    Subjective:    Patient ID: Angela Neal, female    DOB: Jul 31, 1921, 89 y.o.   MRN: [id number]  HPI: Angela Neal is a 89 y.o. female  Chief Complaint  Patient presents with   Diabetes    Patient has an upcoming eye exam next week.    Hypertension    Patient daughter says she has taken the patient off her Amlopidine prescription, as she noticed some swelling in her legs and ankles. Patient daughter says in the evenings, she still notices some slight swelling.    Hyperlipidemia   Cough    Patient has had a cough for a week now, and daughter says the cough is getting the worse. Patient has taken over the counter cough syrup when it happens and it helps.    COUGH Duration: about a month Circumstances of initial development of cough: unknown Cough severity: mild Cough description: non-productive and dry Aggravating factors:  nothing Alleviating factors: nothing Status:  stable Treatments attempted: allegra Wheezing: no Shortness of breath: no Chest pain: no Chest tightness:yes Nasal congestion: no Runny nose: no Postnasal drip: no Frequent throat clearing or swallowing: no Hemoptysis: no Fevers: no Night sweats: no Weight loss: no Heartburn: no Recent foreign travel: no Tuberculosis contacts: no  HYPERTENSION / HYPERLIPIDEMIA Satisfied with current treatment? yes Duration of hypertension: chronic BP monitoring frequency: not checking BP medication side effects: no Past BP meds: losartan Duration of hyperlipidemia: chronic Cholesterol medication side effects: no Cholesterol supplements: none Past cholesterol medications: atorvastatin Medication compliance: excellent compliance Aspirin: no Recent stressors: no Recurrent headaches: no Visual changes: no Palpitations: no Dyspnea: no Chest pain: no Lower extremity edema: no Dizzy/lightheaded:  no  DIABETES Hypoglycemic episodes:no Polydipsia/polyuria: no Visual disturbance: no Chest pain: no Paresthesias: no Glucose Monitoring: yes Taking Insulin?: yes  Long acting insulin: levimir 8 units  Short acting insulin: novolog 4 units TID Blood Pressure Monitoring: not checking Retinal Examination:  scheduled for next month Foot Exam: Up to Date Diabetic Education: Completed Pneumovax: Up to Date Influenza: Up to Date Aspirin: no  DEPRESSION Mood status: stable Satisfied with current treatment?: yes Symptom severity: mild  Duration of current treatment : chronic Side effects: no Medication compliance: excellent compliance Psychotherapy/counseling: no  Previous psychiatric medications: sertraline, mirtazipine Depressed mood: no Anxious mood: no Anhedonia: no Significant weight loss or gain: no Insomnia: no  Fatigue: yes Feelings of worthlessness or guilt: no Impaired concentration/indecisiveness: no Suicidal ideations: no Hopelessness: no Crying spells: no    08/17/2022    1:21 PM 01/19/2022    1:11 PM 12/22/2021    1:14 PM 10/05/2021    1:24 PM 06/04/2021    2:12 PM  Depression screen PHQ 2/9  Decreased Interest 2 0 3 2 0  Down, Depressed, Hopeless 0 0 3 0 0  PHQ - 2 Score 2 0 6 2 0  Altered sleeping 0 0 2 1 0  Tired, decreased energy 1 0 1 1 0  Change in appetite 3 1 1 1  0  Feeling bad or failure about yourself  1 0 2 0 0  Trouble concentrating 1 0 0 3 0  Moving slowly or fidgety/restless 0 0 2 1 0  Suicidal thoughts 0 0 2 0 0  PHQ-9 Score 8 1 16 9  0  Difficult  doing work/chores Not difficult at all Very difficult Extremely dIfficult      Relevant past medical, surgical, family and social history reviewed and updated as indicated. Interim medical history since our last visit reviewed. Allergies and medications reviewed and updated.  Review of Systems  Constitutional: Negative.   HENT: Negative.    Respiratory:  Positive for cough. Negative for  apnea, choking, chest tightness, shortness of breath, wheezing and stridor.   Cardiovascular: Negative.   Gastrointestinal: Negative.   Musculoskeletal: Negative.   Psychiatric/Behavioral: Negative.      Per HPI unless specifically indicated above     Objective:    BP (!) 147/82   Pulse 84   Ht 4\' 11"  (1.499 m)   Wt 99 lb 12.8 oz (45.3 kg)   SpO2 94%   BMI 20.16 kg/m   Wt Readings from Last 3 Encounters:  11/17/22 99 lb 12.8 oz (45.3 kg)  08/17/22 96 lb 1.6 oz (43.6 kg)  04/20/22 94 lb 4.8 oz (42.8 kg)    Physical Exam Vitals and nursing note reviewed.  Constitutional:      General: She is not in acute distress.    Appearance: Normal appearance. She is not ill-appearing, toxic-appearing or diaphoretic.  HENT:     Head: Normocephalic and atraumatic.     Right Ear: External ear normal.     Left Ear: External ear normal.     Nose: Nose normal.     Mouth/Throat:     Mouth: Mucous membranes are moist.     Pharynx: Oropharynx is clear.  Eyes:     General: No scleral icterus.       Right eye: No discharge.        Left eye: No discharge.     Extraocular Movements: Extraocular movements intact.     Conjunctiva/sclera: Conjunctivae normal.     Pupils: Pupils are equal, round, and reactive to light.  Cardiovascular:     Rate and Rhythm: Normal rate and regular rhythm.     Pulses: Normal pulses.     Heart sounds: Murmur heard.     No friction rub. No gallop.  Pulmonary:     Effort: Pulmonary effort is normal. No respiratory distress.     Breath sounds: Normal breath sounds. No stridor. No wheezing, rhonchi or rales.  Chest:     Chest wall: No tenderness.  Musculoskeletal:        General: Normal range of motion.     Cervical back: Normal range of motion and neck supple.  Skin:    General: Skin is warm and dry.     Capillary Refill: Capillary refill takes less than 2 seconds.     Coloration: Skin is not jaundiced or pale.     Findings: No bruising, erythema, lesion or  rash.  Neurological:     General: No focal deficit present.     Mental Status: She is alert and oriented to person, place, and time. Mental status is at baseline.  Psychiatric:        Mood and Affect: Mood normal.        Behavior: Behavior normal.        Thought Content: Thought content normal.        Judgment: Judgment normal.     Results for orders placed or performed in visit on 11/17/22  Bayer DCA Hb A1c Waived  Result Value Ref Range   HB A1C (BAYER DCA - WAIVED) 7.8 (H) 4.8 - 5.6 %  Assessment & Plan:   Problem List Items Addressed This Visit       Endocrine   Type 2 diabetes mellitus with renal complication (HCC)    Stable with A1c of 7.8. Continue to follow with endocrinology. Call with any concerns.       Relevant Medications   atorvastatin (LIPITOR) 40 MG tablet   JANUVIA 100 MG tablet   losartan (COZAAR) 100 MG tablet   Other Relevant Orders   Bayer DCA Hb A1c Waived (Completed)   CBC with Differential/Platelet   Comprehensive metabolic panel   Hyperlipidemia associated with type 2 diabetes mellitus (HCC)    Under good control on current regimen. Continue current regimen. Continue to monitor. Call with any concerns. Refills given. Labs drawn today.       Relevant Medications   atorvastatin (LIPITOR) 40 MG tablet   JANUVIA 100 MG tablet   losartan (COZAAR) 100 MG tablet   Other Relevant Orders   CBC with Differential/Platelet   Lipid Panel w/o Chol/HDL Ratio   Comprehensive metabolic panel     Genitourinary   Benign hypertensive renal disease - Primary    Under good control on current regimen. Continue current regimen. Continue to monitor. Call with any concerns. Refills given. Labs drawn today.        Relevant Orders   CBC with Differential/Platelet   Comprehensive metabolic panel   Stage 3 chronic kidney disease (HCC)    Under good control on current regimen. Continue current regimen. Continue to monitor. Call with any concerns. Refills  given. Labs drawn today.       Relevant Orders   CBC with Differential/Platelet   Comprehensive metabolic panel     Other   Depression, recurrent (HCC)    Under good control on current regimen. Continue current regimen. Continue to monitor. Call with any concerns. Refills given. Labs drawn today.       Relevant Medications   mirtazapine (REMERON) 30 MG tablet   sertraline (ZOLOFT) 50 MG tablet   Other Relevant Orders   CBC with Differential/Platelet   Comprehensive metabolic panel   Other Visit Diagnoses     Acute cough       Will continue symptomatic care. Call if not getting better or getting worse.   Needs flu shot       Flu shot given today.   Relevant Orders   Flu Vaccine Trivalent High Dose (Fluad)        Follow up plan: Return in about 6 months (around 05/17/2023).

## 2022-11-18 DIAGNOSIS — E1169 Type 2 diabetes mellitus with other specified complication: Secondary | ICD-10-CM | POA: Diagnosis not present

## 2022-11-18 DIAGNOSIS — Z23 Encounter for immunization: Secondary | ICD-10-CM

## 2022-11-18 DIAGNOSIS — I129 Hypertensive chronic kidney disease with stage 1 through stage 4 chronic kidney disease, or unspecified chronic kidney disease: Secondary | ICD-10-CM | POA: Diagnosis not present

## 2022-11-18 DIAGNOSIS — Z794 Long term (current) use of insulin: Secondary | ICD-10-CM | POA: Diagnosis not present

## 2022-11-18 DIAGNOSIS — F339 Major depressive disorder, recurrent, unspecified: Secondary | ICD-10-CM | POA: Diagnosis not present

## 2022-11-18 DIAGNOSIS — N183 Chronic kidney disease, stage 3 unspecified: Secondary | ICD-10-CM | POA: Diagnosis not present

## 2022-11-18 DIAGNOSIS — E785 Hyperlipidemia, unspecified: Secondary | ICD-10-CM | POA: Diagnosis not present

## 2022-11-18 DIAGNOSIS — E1122 Type 2 diabetes mellitus with diabetic chronic kidney disease: Secondary | ICD-10-CM | POA: Diagnosis not present

## 2022-11-18 DIAGNOSIS — R051 Acute cough: Secondary | ICD-10-CM | POA: Diagnosis not present

## 2022-11-18 LAB — COMPREHENSIVE METABOLIC PANEL
ALT: 95 [IU]/L — ABNORMAL HIGH (ref 0–32)
AST: 87 [IU]/L — ABNORMAL HIGH (ref 0–40)
Albumin: 4 g/dL (ref 3.6–4.6)
Alkaline Phosphatase: 121 [IU]/L (ref 44–121)
BUN/Creatinine Ratio: 20 (ref 12–28)
BUN: 20 mg/dL (ref 10–36)
Bilirubin Total: 0.3 mg/dL (ref 0.0–1.2)
CO2: 21 mmol/L (ref 20–29)
Calcium: 9.1 mg/dL (ref 8.7–10.3)
Chloride: 104 mmol/L (ref 96–106)
Creatinine, Ser: 1.02 mg/dL — ABNORMAL HIGH (ref 0.57–1.00)
Globulin, Total: 2.7 g/dL (ref 1.5–4.5)
Glucose: 283 mg/dL — ABNORMAL HIGH (ref 70–99)
Potassium: 4.4 mmol/L (ref 3.5–5.2)
Sodium: 141 mmol/L (ref 134–144)
Total Protein: 6.7 g/dL (ref 6.0–8.5)
eGFR: 49 mL/min/{1.73_m2} — ABNORMAL LOW (ref >59)

## 2022-11-18 LAB — CBC WITH DIFFERENTIAL/PLATELET
Basophils Absolute: 0 10*3/uL (ref 0.0–0.2)
Basos: 1 %
EOS (ABSOLUTE): 0.4 10*3/uL (ref 0.0–0.4)
Eos: 7 %
Hematocrit: 38.4 % (ref 34.0–46.6)
Hemoglobin: 12.2 g/dL (ref 11.1–15.9)
Immature Grans (Abs): 0 10*3/uL (ref 0.0–0.1)
Immature Granulocytes: 0 %
Lymphocytes Absolute: 1 10*3/uL (ref 0.7–3.1)
Lymphs: 19 %
MCH: 30.2 pg (ref 26.6–33.0)
MCHC: 31.8 g/dL (ref 31.5–35.7)
MCV: 95 fL (ref 79–97)
Monocytes Absolute: 0.5 10*3/uL (ref 0.1–0.9)
Monocytes: 9 %
Neutrophils Absolute: 3.5 10*3/uL (ref 1.4–7.0)
Neutrophils: 64 %
Platelets: 141 10*3/uL — ABNORMAL LOW (ref 150–450)
RBC: 4.04 x10E6/uL (ref 3.77–5.28)
RDW: 12.9 % (ref 11.7–15.4)
WBC: 5.4 10*3/uL (ref 3.4–10.8)

## 2022-11-18 LAB — LIPID PANEL W/O CHOL/HDL RATIO
Cholesterol, Total: 124 mg/dL (ref 100–199)
HDL: 47 mg/dL (ref >39)
LDL Chol Calc (NIH): 54 mg/dL (ref 0–99)
Triglycerides: 131 mg/dL (ref 0–149)
VLDL Cholesterol Cal: 23 mg/dL (ref 5–40)

## 2022-11-19 ENCOUNTER — Other Ambulatory Visit: Payer: Self-pay | Admitting: Family Medicine

## 2022-11-21 ENCOUNTER — Encounter: Payer: Self-pay | Admitting: Family Medicine

## 2022-11-22 ENCOUNTER — Encounter: Payer: Self-pay | Admitting: Family Medicine

## 2022-11-22 DIAGNOSIS — H353133 Nonexudative age-related macular degeneration, bilateral, advanced atrophic without subfoveal involvement: Secondary | ICD-10-CM | POA: Diagnosis not present

## 2022-11-22 DIAGNOSIS — E113393 Type 2 diabetes mellitus with moderate nonproliferative diabetic retinopathy without macular edema, bilateral: Secondary | ICD-10-CM | POA: Diagnosis not present

## 2022-11-22 DIAGNOSIS — D3131 Benign neoplasm of right choroid: Secondary | ICD-10-CM | POA: Diagnosis not present

## 2022-11-22 LAB — HM DIABETES EYE EXAM

## 2022-11-22 NOTE — Telephone Encounter (Signed)
Unable to refill per protocol, Rx expired. Discontinued 11/17/22.  Requested Prescriptions  Pending Prescriptions Disp Refills   amLODipine (NORVASC) 5 MG tablet [Pharmacy Med Name: AMLODIPINE BESYLATE 5 MG TAB] 90 tablet 1    Sig: TAKE 1 TABLET (5 MG TOTAL) BY MOUTH DAILY. TAKE 1 TABLET BY MOUTH ONCE EVERY DAY     Cardiovascular: Calcium Channel Blockers 2 Failed - 11/19/2022  1:18 PM      Failed - Last BP in normal range    BP Readings from Last 1 Encounters:  11/17/22 (!) 147/82         Passed - Last Heart Rate in normal range    Pulse Readings from Last 1 Encounters:  11/17/22 84         Passed - Valid encounter within last 6 months    Recent Outpatient Visits           5 days ago Benign hypertensive renal disease   [facility] Maynard, [place], DO   3 months ago Wheezing   [facility] [place], Sherran Needs, NP   7 months ago Routine general medical examination at a health care facility   [facility], Megan P, DO   10 months ago Encounter for annual wellness exam in Medicare patient   [facility] [place], Megan P, DO   11 months ago Left leg pain   [facility] [place], Oralia Rud, DO       Future Appointments             In 5 months Laural Benes, Oralia Rud, DO [facility]

## 2022-11-22 NOTE — Progress Notes (Signed)
Printed, fax to [phone] to Dr Tedd Sias at [facility], and mailed to patient.

## 2022-11-30 ENCOUNTER — Encounter: Payer: Self-pay | Admitting: Family Medicine

## 2022-12-21 DIAGNOSIS — Z8781 Personal history of (healed) traumatic fracture: Secondary | ICD-10-CM | POA: Diagnosis not present

## 2022-12-21 DIAGNOSIS — E1122 Type 2 diabetes mellitus with diabetic chronic kidney disease: Secondary | ICD-10-CM | POA: Diagnosis not present

## 2022-12-21 DIAGNOSIS — N1831 Chronic kidney disease, stage 3a: Secondary | ICD-10-CM | POA: Diagnosis not present

## 2022-12-21 DIAGNOSIS — R809 Proteinuria, unspecified: Secondary | ICD-10-CM | POA: Diagnosis not present

## 2022-12-21 DIAGNOSIS — M810 Age-related osteoporosis without current pathological fracture: Secondary | ICD-10-CM | POA: Diagnosis not present

## 2022-12-21 DIAGNOSIS — E1129 Type 2 diabetes mellitus with other diabetic kidney complication: Secondary | ICD-10-CM | POA: Diagnosis not present

## 2022-12-21 DIAGNOSIS — E113313 Type 2 diabetes mellitus with moderate nonproliferative diabetic retinopathy with macular edema, bilateral: Secondary | ICD-10-CM | POA: Diagnosis not present

## 2022-12-21 DIAGNOSIS — Z794 Long term (current) use of insulin: Secondary | ICD-10-CM | POA: Diagnosis not present

## 2022-12-22 DIAGNOSIS — H903 Sensorineural hearing loss, bilateral: Secondary | ICD-10-CM | POA: Diagnosis not present

## 2022-12-22 DIAGNOSIS — H6123 Impacted cerumen, bilateral: Secondary | ICD-10-CM | POA: Diagnosis not present

## 2022-12-30 DIAGNOSIS — E1122 Type 2 diabetes mellitus with diabetic chronic kidney disease: Secondary | ICD-10-CM | POA: Diagnosis not present

## 2023-01-18 ENCOUNTER — Encounter: Payer: Self-pay | Admitting: Emergency Medicine

## 2023-01-25 ENCOUNTER — Ambulatory Visit: Payer: PPO | Admitting: Emergency Medicine

## 2023-01-25 VITALS — Ht 59.0 in | Wt 100.0 lb

## 2023-01-25 DIAGNOSIS — Z0000 Encounter for general adult medical examination without abnormal findings: Secondary | ICD-10-CM | POA: Diagnosis not present

## 2023-01-25 NOTE — Progress Notes (Signed)
Subjective:   Angela Neal is a 89 y.o. female who presents for Medicare Annual (Subsequent) preventive examination.  This patient declined Interactive audio and Acupuncturist. Therefore the visit was completed with audio only.   Visit Complete: Virtual I connected with  Dionne Ano on 01/25/23 by a audio enabled telemedicine application and verified that I am speaking with the correct person using two identifiers.  Patient Location: Home  Provider Location: Office/Clinic  I discussed the limitations of evaluation and management by telemedicine. The patient expressed understanding and agreed to proceed.  Vital Signs: Because this visit was a virtual/telehealth visit, some criteria may be missing or patient reported. Any vitals not documented were not able to be obtained and vitals that have been documented are patient reported.   Cardiac Risk Factors include: advanced age (>20men, >58 women);diabetes mellitus;dyslipidemia;sedentary lifestyle     Objective:    Today's Vitals   01/25/23 1350  Weight: 100 lb (45.4 kg)  Height: 4\' 11"  (1.499 m)   Body mass index is 20.2 kg/m.     01/25/2023    2:14 PM 12/09/2021   10:05 AM 12/27/2020    4:47 PM 12/27/2020    3:30 PM 08/11/2019   11:50 AM 05/01/2019    1:54 PM  Advanced Directives  Does Patient Have a Medical Advance Directive? Yes No Yes Yes Yes Yes  Type of Estate agent of [place];Living will  Healthcare Power of [place];Living will Healthcare Power of [place];Living will Healthcare Power of [place];Living will Healthcare Power of [place];Living will  Does patient want to make changes to medical advance directive? No - Patient declined       Copy of Healthcare Power of Attorney in Chart? No - copy requested         Current Medications (verified) Outpatient Encounter Medications as of 01/25/2023  Medication Sig   alendronate (FOSAMAX) 70 MG tablet Take by mouth.   atorvastatin  (LIPITOR) 40 MG tablet TAKE 1 TABLET BY MOUTH EVERY DAY   Continuous Glucose Receiver (FREESTYLE LIBRE 3 READER) DEVI Use 1 each as directed   GNP NATURAL FIBER 0.52 g capsule TAKE 1 CAPSULE BY MOUTH ONCE DAILY AT NOON   GNP VITAMIN D3 EXTRA STRENGTH 25 MCG (1000 UT) tablet TAKE 1 TABLET BY MOUTH ONCE EVERY DAY FOR SUPPLEMENT   JANUVIA 100 MG tablet Take 1 tablet (100 mg total) by mouth daily.   LEVEMIR FLEXTOUCH 100 UNIT/ML FlexTouch Pen Inject 8 Units into the skin at bedtime.   losartan (COZAAR) 100 MG tablet Take 1 tablet (100 mg total) by mouth daily.   mirtazapine (REMERON) 30 MG tablet Take 1 tablet (30 mg total) by mouth at bedtime.   montelukast (SINGULAIR) 10 MG tablet Take 1 tablet (10 mg total) by mouth at bedtime. TAKE 1 TABLET(10 MG) BY MOUTH NIGHTLY   Multiple Vitamins-Minerals (GNP HEALTHY EYES SUPERVISION 2) CAPS TAKE 1 CAPSULE BY MOUTH 2 TIMES DAILY   NOVOLOG FLEXPEN 100 UNIT/ML FlexPen Inject 4 Units into the skin 3 (three) times daily before meals. (plus up to 5u sliding scale correction)   sertraline (ZOLOFT) 50 MG tablet Take 1.5 tablets (75 mg total) by mouth daily.   TRESIBA FLEXTOUCH 100 UNIT/ML FlexTouch Pen Inject into the skin at bedtime.   TRUE METRIX BLOOD GLUCOSE TEST test strip TEST BLOOD SUGAR 4 TIMES DAILY BEFORE LEVEMIR IS GIVEN OR AS DIRECTED BY PHYSICIAN   TRUEplus Safety Lancets 28G MISC USE FOR FINGER STICK BLOOD SUGARS (CURRENTLY 4  TIMES DAILY)   ULTRACARE PEN NEEDLES 33G X 4 MM MISC USE WITH INSULIN PENS AS DIRECTED BY PHYSICIAN (CURRENTLY 4 TIMES DAILY)   lidocaine (LIDODERM) 5 % Place 1 patch onto the skin daily. Remove & Discard patch within 12 hours or as directed by MD (Patient not taking: Reported on 01/25/2023)   naproxen (NAPROSYN) 500 MG tablet Take 1 tablet (500 mg total) by mouth 2 (two) times daily with a meal. (Patient not taking: Reported on 01/25/2023)   Tiotropium Bromide Monohydrate 2.5 MCG/ACT AERS Inhale 3 puffs into the lungs 2 (two) times  daily as needed. (Patient not taking: Reported on 01/25/2023)   No facility-administered encounter medications on file as of 01/25/2023.    Allergies (verified) Sulfa antibiotics and Amlodipine   History: Past Medical History:  Diagnosis Date   Asthma    COPD (chronic obstructive pulmonary disease) (HCC)    Diabetes mellitus without complication (HCC)    Hypertension    Macular degeneration    Stroke [facility])    Past Surgical History:  Procedure Laterality Date   ABDOMINAL HYSTERECTOMY     HIP ARTHROPLASTY     Family History  Problem Relation Age of Onset   Arthritis Mother    Heart disease Mother    Heart attack Father 73   Social History   Socioeconomic History   Marital status: Widowed    Spouse name: Not on file   Number of children: 3   Years of education: Not on file   Highest education level: Not on file  Occupational History   Not on file  Tobacco Use   Smoking status: Former    Current packs/day: 0.00    Average packs/day: 1.3 packs/day for 10.0 years (12.5 ttl pk-yrs)    Types: Cigarettes    Start date: 73    Quit date: 28    Years since quitting: 70.1   Smokeless tobacco: Never  Vaping Use   Vaping status: Never Used  Substance and Sexual Activity   Alcohol use: Never   Drug use: Never   Sexual activity: Not Currently  Other Topics Concern   Not on file  Social History Narrative   2 living children, 1 deceased child at childbirth (lived 89 hours)   Social Drivers of Corporate investment banker Strain: Low Risk  (01/25/2023)   Overall Financial Resource Strain (CARDIA)    Difficulty of Paying Living Expenses: Not very hard  Food Insecurity: No Food Insecurity (01/25/2023)   Hunger Vital Sign    Worried About Running Out of Food in the Last Year: Never true    Ran Out of Food in the Last Year: Never true  Transportation Needs: No Transportation Needs (01/25/2023)   PRAPARE - Administrator, Civil Service (Medical): No    Lack of  Transportation (Non-Medical): No  Physical Activity: Inactive (01/25/2023)   Exercise Vital Sign    Days of Exercise per Week: 0 days    Minutes of Exercise per Session: 0 min  Stress: Not on file  Social Connections: Socially Isolated (01/25/2023)   Social Connection and Isolation Panel [NHANES]    Frequency of Communication with Friends and Family: Never    Frequency of Social Gatherings with Friends and Family: More than three times a week    Attends Religious Services: Never    Database administrator or Organizations: No    Attends Banker Meetings: Never    Marital Status: Widowed    Tobacco  Counseling Counseling given: Not Answered   Clinical Intake:  Pre-visit preparation completed: Yes  Pain : No/denies pain     BMI - recorded: 20.2 Nutritional Status: BMI of 19-24  Normal Nutritional Risks: None Diabetes: Yes CBG done?: No (FBS 69 at 4:30am per daughter) Did pt. bring in CBG monitor from home?: No  How often do you need to have someone help you when you read instructions, pamphlets, or other written materials from your doctor or pharmacy?: 5 - Always (daughter reads for her, she has macular degeneration)  Interpreter Needed?: No  Information entered by :: Tora Kindred, CMA   Activities of Daily Living    01/25/2023    1:57 PM  In your present state of health, do you have any difficulty performing the following activities:  Hearing? 0  Vision? 1  Comment macular degeneration  Difficulty concentrating or making decisions? 1  Comment dementia  Walking or climbing stairs? 1  Comment uses walker (rollator)  Dressing or bathing? 1  Comment daughter is caregiver  Doing errands, shopping? 1  Comment daughter is caregiver  Quarry manager and eating ? Y  Comment daughter is caregiver  Using the Toilet? N  In the past six months, have you accidently leaked urine? Y  Comment wears depend  Do you have problems with loss of bowel control? N   Managing your Medications? Y  Comment daughter is caregiver  Managing your Finances? Y  Comment daughter is caregiver  Housekeeping or managing your Housekeeping? Y  Comment daughter is caregiver    Patient Care Team: Dorcas Carrow, DO as PCP - General (Family Medicine) Solum, Marlana Salvage, MD as Physician Assistant (Endocrinology) Alwyn Pea, MD as Consulting Physician (Cardiology)  Indicate any recent Medical Services you may have received from other than [facility] providers in the past year (date may be approximate).     Assessment:   This is a routine wellness examination for Rateel.  Hearing/Vision screen Hearing Screening - Comments:: Has hearing loss, needs cochlear implant, but won't due surgery because of age Vision Screening - Comments:: Gets eye exams. [facility], Dr. Dellie Burns   Goals Addressed             This Visit's Progress    Patient Stated       Maintain current health      Depression Screen    01/25/2023    2:11 PM 08/17/2022    1:21 PM 01/19/2022    1:11 PM 12/22/2021    1:14 PM 10/05/2021    1:24 PM 06/04/2021    2:12 PM 01/27/2021    9:51 AM  PHQ 2/9 Scores  PHQ - 2 Score  2 0 6 2 0 2  PHQ- 9 Score  8 1 16 9  0 11  Exception Documentation Other- indicate reason in comment box        Not completed dementia          Fall Risk    01/25/2023    2:16 PM 08/17/2022    1:20 PM 01/19/2022    1:12 PM 12/22/2021    1:13 PM 10/05/2021    1:24 PM  Fall Risk   Falls in the past year? 1 1 1 1  0  Number falls in past yr: 0 1 0 0 0  Injury with Fall? 1 1 0 0 0  Risk for fall due to : History of fall(s);Impaired balance/gait;Orthopedic patient;Impaired mobility;Impaired vision Impaired balance/gait;Impaired mobility History of fall(s) History of fall(s) No Fall  Risks  Follow up Education provided;Falls prevention discussed;Falls evaluation completed Falls evaluation completed Falls evaluation completed Falls evaluation completed Falls evaluation completed     MEDICARE RISK AT HOME: Medicare Risk at Home Any stairs in or around the home?: Yes If so, are there any without handrails?: No Home free of loose throw rugs in walkways, pet beds, electrical cords, etc?: Yes Adequate lighting in your home to reduce risk of falls?: Yes Life alert?: No Use of a cane, walker or w/c?: Yes (rollator) Grab bars in the bathroom?: Yes Shower chair or bench in shower?: Yes Elevated toilet seat or a handicapped toilet?: No  TIMED UP AND GO:  Was the test performed?  No    Cognitive Function:        01/25/2023    2:18 PM 01/19/2022    1:32 PM  6CIT Screen  What Year? 4 points 4 points  What month? 3 points 3 points  What time? 3 points 3 points  Count back from 20 4 points 4 points  Months in reverse 4 points 2 points  Repeat phrase 10 points 10 points  Total Score 28 points 26 points    Immunizations Immunization History  Administered Date(s) Administered   Fluad Quad(high Dose 65+) 12/07/2019, 10/02/2020, 10/05/2021   Fluad Trivalent(High Dose 65+) 11/18/2022   Influenza, High Dose Seasonal PF 09/12/2013, 09/18/2014, 11/22/2017   Influenza, Seasonal, Injecte, Preservative Fre 12/05/2015, 11/19/2018   Influenza-Unspecified 09/30/2010, 10/04/2011, 09/15/2012   Moderna Sars-Covid-2 Vaccination 02/17/2019, 03/17/2019   Pneumococcal Conjugate-13 09/18/2014   Pneumococcal Polysaccharide-23 02/13/2016   Td 03/10/2006    TDAP status: Due, Education has been provided regarding the importance of this vaccine. Advised may receive this vaccine at local pharmacy or Health Dept. Aware to provide a copy of the vaccination record if obtained from local pharmacy or Health Dept. Verbalized acceptance and understanding.  Flu Vaccine status: Up to date  Pneumococcal vaccine status: Up to date  Covid-19 vaccine status: Declined, Education has been provided regarding the importance of this vaccine but patient still declined. Advised may receive this  vaccine at local pharmacy or Health Dept.or vaccine clinic. Aware to provide a copy of the vaccination record if obtained from local pharmacy or Health Dept. Verbalized acceptance and understanding.  Qualifies for Shingles Vaccine? Yes   Zostavax completed No   Shingrix Completed?: No.    Education has been provided regarding the importance of this vaccine. Patient has been advised to call insurance company to determine out of pocket expense if they have not yet received this vaccine. Advised may also receive vaccine at local pharmacy or Health Dept. Verbalized acceptance and understanding. Declined  Screening Tests Health Maintenance  Topic Date Due   Zoster Vaccines- Shingrix (1 of 2) Never done   DTaP/Tdap/Td (2 - Tdap) 03/09/2016   HEMOGLOBIN A1C  05/17/2023   FOOT EXAM  11/17/2023   OPHTHALMOLOGY EXAM  11/22/2023   Medicare Annual Wellness (AWV)  01/25/2024   Pneumonia Vaccine 16+ Years old  Completed   INFLUENZA VACCINE  Completed   DEXA SCAN  Completed   HPV VACCINES  Aged Out   COVID-19 Vaccine  Discontinued    Health Maintenance  Health Maintenance Due  Topic Date Due   Zoster Vaccines- Shingrix (1 of 2) Never done   DTaP/Tdap/Td (2 - Tdap) 03/09/2016    Colorectal cancer screening: No longer required.   Mammogram status: No longer required due to age.  Bone Density Status: followed by endocrinology, Dr. Tedd Sias @ [facility]. Last done 02/23/22  Lung Cancer Screening: (Low Dose CT Chest recommended if Age 1-80 years, 20 pack-year currently smoking OR have quit w/in 15years.) does not qualify.   Lung Cancer Screening Referral: n/a  Additional Screening:  Hepatitis C Screening: does not qualify; Completed n/a  Vision Screening: Recommended annual ophthalmology exams for early detection of glaucoma and other disorders of the eye. Is the patient up to date with their annual eye exam?  Yes  Who is the provider or what is the name of the office in which the patient  attends annual eye exams? Dr. Dellie Burns @ [facility] If pt is not established with a provider, would they like to be referred to a provider to establish care? No .   Dental Screening: Recommended annual dental exams for proper oral hygiene  Diabetic Foot Exam: Diabetic Foot Exam: Completed 11/17/22  Community Resource Referral / Chronic Care Management: CRR required this visit?  No   CCM required this visit?  No     Plan:     I have personally reviewed and noted the following in the patient's chart:   Medical and social history Use of alcohol, tobacco or illicit drugs  Current medications and supplements including opioid prescriptions. Patient is not currently taking opioid prescriptions. Functional ability and status Nutritional status Physical activity Advanced directives List of other physicians Hospitalizations, surgeries, and ER visits in previous 12 months Vitals Screenings to include cognitive, depression, and falls Referrals and appointments  In addition, I have reviewed and discussed with patient certain preventive protocols, quality metrics, and best practice recommendations. A written personalized care plan for preventive services as well as general preventive health recommendations were provided to patient.     Tora Kindred, CMA   01/25/2023   After Visit Summary: (Mail) Due to this being a telephonic visit, the after visit summary with patients personalized plan was offered to patient via mail   Nurse Notes:  6 CIT Score - 28 Carolyn, patient's daughter, was present during today's visit. Declined Covid and shingles vaccines Unsure if tetanus is UTD. Eber Jones thinks patient had one when she fell July of 2024. Last DEXA scan 02/23/22 osteoporosis, followed by Dr Tedd Sias

## 2023-01-25 NOTE — Patient Instructions (Signed)
Ms. Geffrard , Thank you for taking time to come for your Medicare Wellness Visit. I appreciate your ongoing commitment to your health goals. Please review the following plan we discussed and let me know if I can assist you in the future.   Referrals/Orders/Follow-Ups/Clinician Recommendations: may need tetanus shot if not given in July 2024 after her fall.  This is a list of the screening recommended for you and due dates:  Health Maintenance  Topic Date Due   Zoster (Shingles) Vaccine (1 of 2) Never done   DTaP/Tdap/Td vaccine (2 - Tdap) 03/09/2016   Hemoglobin A1C  05/17/2023   Complete foot exam   11/17/2023   Eye exam for diabetics  11/22/2023   Medicare Annual Wellness Visit  01/25/2024   Pneumonia Vaccine  Completed   Flu Shot  Completed   DEXA scan (bone density measurement)  Completed   HPV Vaccine  Aged Out   COVID-19 Vaccine  Discontinued    Advanced directives: (Copy Requested) Please bring a copy of your health care power of attorney and living will to the office to be added to your chart at your convenience.  Next Medicare Annual Wellness Visit scheduled for next year: Yes, 01/31/24 @ 1:50pm  Fall Prevention in the Home, Adult Falls can cause injuries and affect people of all ages. There are many simple things that you can do to make your home safe and to help prevent falls. If you need it, ask for help making these changes. What actions can I take to prevent falls? General information Use good lighting in all rooms. Make sure to: Replace any light bulbs that burn out. Turn on lights if it is dark and use night-lights. Keep items that you use often in easy-to-reach places. Lower the shelves around your home if needed. Move furniture so that there are clear paths around it. Do not keep throw rugs or other things on the floor that can make you trip. If any of your floors are uneven, fix them. Add color or contrast paint or tape to clearly mark and help you see: Grab  bars or handrails. First and last steps of staircases. Where the edge of each step is. If you use a ladder or stepladder: Make sure that it is fully opened. Do not climb a closed ladder. Make sure the sides of the ladder are locked in place. Have someone hold the ladder while you use it. Know where your pets are as you move through your home. What can I do in the bathroom?     Keep the floor dry. Clean up any water that is on the floor right away. Remove soap buildup in the bathtub or shower. Buildup makes bathtubs and showers slippery. Use non-skid mats or decals on the floor of the bathtub or shower. Attach bath mats securely with double-sided, non-slip rug tape. If you need to sit down while you are in the shower, use a non-slip stool. Install grab bars by the toilet and in the bathtub and shower. Do not use towel bars as grab bars. What can I do in the bedroom? Make sure that you have a light by your bed that is easy to reach. Do not use any sheets or blankets on your bed that hang to the floor. Have a firm bench or chair with side arms that you can use for support when you get dressed. What can I do in the kitchen? Clean up any spills right away. If you need to reach something  above you, use a sturdy step stool that has a grab bar. Keep electrical cables out of the way. Do not use floor polish or wax that makes floors slippery. What can I do with my stairs? Do not leave anything on the stairs. Make sure that you have a light switch at the top and the bottom of the stairs. Have them installed if you do not have them. Make sure that there are handrails on both sides of the stairs. Fix handrails that are broken or loose. Make sure that handrails are as long as the staircases. Install non-slip stair treads on all stairs in your home if they do not have carpet. Avoid having throw rugs at the top or bottom of stairs, or secure the rugs with carpet tape to prevent them from  moving. Choose a carpet design that does not hide the edge of steps on the stairs. Make sure that carpet is firmly attached to the stairs. Fix any carpet that is loose or worn. What can I do on the outside of my home? Use bright outdoor lighting. Repair the edges of walkways and driveways and fix any cracks. Clear paths of anything that can make you trip, such as tools or rocks. Add color or contrast paint or tape to clearly mark and help you see high doorway thresholds. Trim any bushes or trees on the main path into your home. Check that handrails are securely fastened and in good repair. Both sides of all steps should have handrails. Install guardrails along the edges of any raised decks or porches. Have leaves, snow, and ice cleared regularly. Use sand, salt, or ice melt on walkways during winter months if you live where there is ice and snow. In the garage, clean up any spills right away, including grease or oil spills. What other actions can I take? Review your medicines with your health care provider. Some medicines can make you confused or feel dizzy. This can increase your chance of falling. Wear closed-toe shoes that fit well and support your feet. Wear shoes that have rubber soles and low heels. Use a cane, walker, scooter, or crutches that help you move around if needed. Talk with your provider about other ways that you can decrease your risk of falls. This may include seeing a physical therapist to learn to do exercises to improve movement and strength. Where to find more information Centers for Disease Control and Prevention, STEADI: TonerPromos.no [employer] on Aging: BaseRingTones.pl National Institute on Aging: BaseRingTones.pl Contact a health care provider if: You are afraid of falling at home. You feel weak, drowsy, or dizzy at home. You fall at home. Get help right away if you: Lose consciousness or have trouble moving after a fall. Have a fall that causes a head injury. These  symptoms may be an emergency. Get help right away. Call 911. Do not wait to see if the symptoms will go away. Do not drive yourself to the hospital. This information is not intended to replace advice given to you by your health care provider. Make sure you discuss any questions you have with your health care provider. Document Revised: 08/24/2021 Document Reviewed: 08/24/2021 Elsevier Patient Education  2024 [employer].

## 2023-01-30 DIAGNOSIS — E1122 Type 2 diabetes mellitus with diabetic chronic kidney disease: Secondary | ICD-10-CM | POA: Diagnosis not present

## 2023-02-01 DIAGNOSIS — J45909 Unspecified asthma, uncomplicated: Secondary | ICD-10-CM | POA: Diagnosis not present

## 2023-02-01 DIAGNOSIS — R9431 Abnormal electrocardiogram [ECG] [EKG]: Secondary | ICD-10-CM | POA: Diagnosis not present

## 2023-02-01 DIAGNOSIS — I10 Essential (primary) hypertension: Secondary | ICD-10-CM | POA: Diagnosis not present

## 2023-02-01 DIAGNOSIS — I169 Hypertensive crisis, unspecified: Secondary | ICD-10-CM | POA: Diagnosis not present

## 2023-02-01 DIAGNOSIS — Z79899 Other long term (current) drug therapy: Secondary | ICD-10-CM | POA: Diagnosis not present

## 2023-02-01 DIAGNOSIS — I119 Hypertensive heart disease without heart failure: Secondary | ICD-10-CM | POA: Diagnosis not present

## 2023-02-01 DIAGNOSIS — Z8572 Personal history of non-Hodgkin lymphomas: Secondary | ICD-10-CM | POA: Diagnosis not present

## 2023-02-01 DIAGNOSIS — Z8673 Personal history of transient ischemic attack (TIA), and cerebral infarction without residual deficits: Secondary | ICD-10-CM | POA: Diagnosis not present

## 2023-02-01 DIAGNOSIS — N1831 Chronic kidney disease, stage 3a: Secondary | ICD-10-CM | POA: Diagnosis not present

## 2023-02-01 DIAGNOSIS — J9811 Atelectasis: Secondary | ICD-10-CM | POA: Diagnosis not present

## 2023-02-01 DIAGNOSIS — Z87891 Personal history of nicotine dependence: Secondary | ICD-10-CM | POA: Diagnosis not present

## 2023-02-01 DIAGNOSIS — R739 Hyperglycemia, unspecified: Secondary | ICD-10-CM | POA: Diagnosis not present

## 2023-02-01 DIAGNOSIS — I129 Hypertensive chronic kidney disease with stage 1 through stage 4 chronic kidney disease, or unspecified chronic kidney disease: Secondary | ICD-10-CM | POA: Diagnosis not present

## 2023-02-01 DIAGNOSIS — R000 Tachycardia, unspecified: Secondary | ICD-10-CM | POA: Diagnosis not present

## 2023-02-01 DIAGNOSIS — E1122 Type 2 diabetes mellitus with diabetic chronic kidney disease: Secondary | ICD-10-CM | POA: Diagnosis not present

## 2023-02-02 DIAGNOSIS — I10 Essential (primary) hypertension: Secondary | ICD-10-CM | POA: Diagnosis not present

## 2023-02-03 ENCOUNTER — Telehealth: Payer: Self-pay

## 2023-02-03 NOTE — Transitions of Care (Post Inpatient/ED Visit) (Signed)
02/03/2023  Name: Angela Neal MRN: [id number] DOB: [birth year]  Today's TOC FU Call Status: Today's TOC FU Call Status:: Successful TOC FU Call Completed TOC FU Call Complete Date: 02/03/23 Patient's Name and Date of Birth confirmed.  Transition Care Management Follow-up Telephone Call Date of Discharge: 02/02/23 Discharge Facility: Other ([facility]) Name of Other ([facility]) Discharge Facility: [facility] Type of Discharge: Emergency Department Reason for ED Visit: Other: (hypertension) How have you been since you were released from the hospital?: Better Any questions or concerns?: No  Items Reviewed: Did you receive and understand the discharge instructions provided?: Yes Medications obtained,verified, and reconciled?: Yes (Medications Reviewed) Any new allergies since your discharge?: No Dietary orders reviewed?: Yes Do you have support at home?: Yes People in Home: child(ren), adult  Medications Reviewed Today: Medications Reviewed Today     Reviewed by Karena Addison, LPN (Licensed Practical Nurse) on 02/03/23 at [phone]  Med List Status: <None>   Medication Order Taking? Sig Documenting Provider Last Dose Status Informant  alendronate (FOSAMAX) 70 MG tablet [id number]  Take by mouth. [provider]  Active            Med Note Maisie Fus, PAULA   Tue Jan 25, 2023  1:59 PM) weekly  atorvastatin (LIPITOR) 40 MG tablet [id number]  TAKE 1 TABLET BY MOUTH EVERY DAY Johnson, Megan P, DO  Active   Continuous Glucose Receiver (FREESTYLE LIBRE 3 READER) DEVI [id number]  Use 1 each as directed [provider]  Active   [facility] NATURAL FIBER 0.52 g capsule [id number]  TAKE 1 CAPSULE BY MOUTH ONCE DAILY AT Adella Nissen, Megan P, DO  Active            Med Note Maisie Fus, PAULA   Tue Jan 25, 2023  2:00 PM) Twice daily  GNP VITAMIN D3 EXTRA STRENGTH 25 MCG (1000 UT) tablet [id number]  TAKE 1 TABLET BY MOUTH ONCE EVERY DAY FOR SUPPLEMENT Johnson, Megan P, DO  Active    hydrochlorothiazide (MICROZIDE) 12.5 MG capsule [id number] Yes Take 12.5 mg by mouth daily. [provider]  Active   JANUVIA 100 MG tablet [id number]  Take 1 tablet (100 mg total) by mouth daily. Johnson, Megan P, DO  Active   LEVEMIR FLEXTOUCH 100 UNIT/ML FlexTouch Pen [id number]  Inject 8 Units into the skin at bedtime. Weber Cooks, NP  Expired 01/25/23 2359   lidocaine (LIDODERM) 5 % [id number]  Place 1 patch onto the skin daily. Remove & Discard patch within 12 hours or as directed by MD  Patient not taking: Reported on 01/25/2023   Olevia Perches P, DO  Active   losartan (COZAAR) 100 MG tablet [id number]  Take 1 tablet (100 mg total) by mouth daily. Johnson, Megan P, DO  Active   mirtazapine (REMERON) 30 MG tablet [id number]  Take 1 tablet (30 mg total) by mouth at bedtime. Johnson, Megan P, DO  Active   montelukast (SINGULAIR) 10 MG tablet [id number]  Take 1 tablet (10 mg total) by mouth at bedtime. TAKE 1 TABLET(10 MG) BY MOUTH NIGHTLY Johnson, Megan P, DO  Active   Multiple Vitamins-Minerals (GNP HEALTHY EYES SUPERVISION 2) CAPS [id number]  TAKE 1 CAPSULE BY MOUTH 2 TIMES DAILY Johnson, Megan P, DO  Active   naproxen (NAPROSYN) 500 MG tablet [id number]  Take 1 tablet (500 mg total) by mouth 2 (two) times daily with a meal.  Patient not taking: Reported on 01/25/2023   Olevia Perches P, DO  Active   NOVOLOG FLEXPEN  100 UNIT/ML FlexPen [id number]  Inject 4 Units into the skin 3 (three) times daily before meals. (plus up to 5u sliding scale correction) Laural Benes, Megan P, DO  Active   sertraline (ZOLOFT) 50 MG tablet [id number]  Take 1.5 tablets (75 mg total) by mouth daily. Laural Benes, Megan P, DO  Active   Tiotropium Bromide Monohydrate 2.5 MCG/ACT AERS [id number]  Inhale 3 puffs into the lungs 2 (two) times daily as needed.  Patient not taking: Reported on 01/25/2023   [provider]  Active   TRESIBA FLEXTOUCH 100 UNIT/ML FlexTouch Pen [id number]  Inject into the skin at  bedtime. [provider]  Active            Med Note Maisie Fus, PAULA   Tue Jan 25, 2023  2:06 PM) Replaces Levemir (has 2 doses left of Levemir)  TRUE METRIX BLOOD GLUCOSE TEST test strip [id number]  TEST BLOOD SUGAR 4 TIMES DAILY BEFORE LEVEMIR IS GIVEN OR AS DIRECTED BY PHYSICIAN Dorcas Carrow, DO  Active   TRUEplus Safety Lancets 28G MISC [id number]  USE FOR FINGER STICK BLOOD SUGARS (CURRENTLY 4 TIMES DAILY) Johnson, Megan P, DO  Active   ULTRACARE PEN NEEDLES 33G X 4 MM MISC [id number]  USE WITH INSULIN PENS AS DIRECTED BY PHYSICIAN (CURRENTLY 4 TIMES DAILY) Dorcas Carrow, DO  Active             Home Care and Equipment/Supplies: Were Home Health Services Ordered?: NA Any new equipment or medical supplies ordered?: NA  Functional Questionnaire: Do you need assistance with bathing/showering or dressing?: No Do you need assistance with meal preparation?: No Do you need assistance with eating?: No Do you have difficulty maintaining continence: No Do you need assistance with getting out of bed/getting out of a chair/moving?: No Do you have difficulty managing or taking your medications?: No  Follow up appointments reviewed: PCP Follow-up appointment confirmed?: Yes Date of PCP follow-up appointment?: 02/04/23 Follow-up Provider: [facility] Follow-up appointment confirmed?: NA Do you need transportation to your follow-up appointment?: No Do you understand care options if your condition(s) worsen?: Yes-patient verbalized understanding    SIGNATURE Karena Addison, LPN [facility] Nurse Health Advisor Direct Dial [phone]

## 2023-02-04 ENCOUNTER — Ambulatory Visit (INDEPENDENT_AMBULATORY_CARE_PROVIDER_SITE_OTHER): Payer: PPO | Admitting: Family Medicine

## 2023-02-04 ENCOUNTER — Encounter: Payer: Self-pay | Admitting: Family Medicine

## 2023-02-04 VITALS — BP 114/78 | HR 87 | Wt 101.8 lb

## 2023-02-04 DIAGNOSIS — R0989 Other specified symptoms and signs involving the circulatory and respiratory systems: Secondary | ICD-10-CM | POA: Diagnosis not present

## 2023-02-04 DIAGNOSIS — I129 Hypertensive chronic kidney disease with stage 1 through stage 4 chronic kidney disease, or unspecified chronic kidney disease: Secondary | ICD-10-CM | POA: Diagnosis not present

## 2023-02-04 MED ORDER — HYDROCHLOROTHIAZIDE 12.5 MG PO CAPS: 12.5000 mg | ORAL_CAPSULE | Freq: Every day | ORAL | 1 refills | Status: DC

## 2023-02-04 MED ORDER — FLUTICASONE PROPIONATE 50 MCG/ACT NA SUSP: 2.0000 | Freq: Every day | NASAL | 6 refills | Status: DC

## 2023-02-04 NOTE — Progress Notes (Signed)
BP 114/78   Pulse 87   Wt 101 lb 12.8 oz (46.2 kg)   SpO2 100%   BMI 20.56 kg/m    Subjective:    Patient ID: Angela Neal, female    DOB: 11/29/1921, 89 y.o.   MRN: [id number]  HPI: Angela Neal is a 89 y.o. female  Chief Complaint  Patient presents with   Hypertension    Patient was recently hospitalized for Hypertension 263/157 and sugar levels were elevated. Patient was prescribed 12.5 MG HCTZ at night. Patient was discharged and her blood pressure was 147 as the top number. Patient daughter has still noticed an elevation in the at home readings.    Hospitalization Follow-up   Cough    Patient was sick about two weeks ago, with runny nose, coughing and sneezing, but no fevers. Patient daughter says the patient still has some congestion, runny nose and coughing. Patient has tried over the counter medication DayQuil and NyQuil.    ER FOLLOW UP Time since discharge: 3 days  Hospital/facility: [facility] Diagnosis: hypertension Procedures/tests: labs, EKG Consultants: none New medications: 12.5mg  HCTZ Discharge instructions:  follow up here Status: better  HYPERTENSION  Hypertension status: better  Satisfied with current treatment? yes Duration of hypertension: chronic BP monitoring frequency:  a few times a week BP medication side effects:  no Medication compliance: excellent compliance Previous BP meds:losartan, hydrochlorothiazide,  Aspirin: no Recurrent headaches: no Visual changes: no Palpitations: no Dyspnea: no Chest pain: no Lower extremity edema: no Dizzy/lightheaded: no  UPPER RESPIRATORY TRACT INFECTION- Was sick about 2 weeks ago- still a bit congested.  Duration: about 2 weeks Worst symptom: runny nose Fever: no Cough: yes Shortness of breath: no Wheezing: no Chest pain: no Chest tightness: no Chest congestion: no Nasal congestion: yes Runny nose: no Post nasal drip: no Sneezing: no Sore throat: no Swollen glands: no Sinus pressure:  no Headache: no Face pain: no Toothache: no Ear pain: no  Ear pressure: no  Eyes red/itching:no Eye drainage/crusting: no  Vomiting: no Rash: no Fatigue: no Sick contacts: no Strep contacts: no  Context: better Recurrent sinusitis: no Relief with OTC cold/cough medications: no  Treatments attempted: cough syrup    Relevant past medical, surgical, family and social history reviewed and updated as indicated. Interim medical history since our last visit reviewed. Allergies and medications reviewed and updated.  Review of Systems  Constitutional: Negative.   Respiratory: Negative.    Cardiovascular: Negative.   Musculoskeletal: Negative.   Neurological: Negative.   Psychiatric/Behavioral: Negative.      Per HPI unless specifically indicated above     Objective:    BP 114/78   Pulse 87   Wt 101 lb 12.8 oz (46.2 kg)   SpO2 100%   BMI 20.56 kg/m   Wt Readings from Last 3 Encounters:  02/04/23 101 lb 12.8 oz (46.2 kg)  01/25/23 100 lb (45.4 kg)  11/17/22 99 lb 12.8 oz (45.3 kg)    Physical Exam Vitals and nursing note reviewed.  Constitutional:      General: She is not in acute distress.    Appearance: Normal appearance. She is not ill-appearing, toxic-appearing or diaphoretic.  HENT:     Head: Normocephalic and atraumatic.     Right Ear: Tympanic membrane, ear canal and external ear normal.     Left Ear: Tympanic membrane, ear canal and external ear normal.     Nose: Rhinorrhea present. No congestion.     Mouth/Throat:  Mouth: Mucous membranes are moist.     Pharynx: Oropharynx is clear. No oropharyngeal exudate or posterior oropharyngeal erythema.  Eyes:     General: No scleral icterus.       Right eye: No discharge.        Left eye: No discharge.     Extraocular Movements: Extraocular movements intact.     Conjunctiva/sclera: Conjunctivae normal.     Pupils: Pupils are equal, round, and reactive to light.  Cardiovascular:     Rate and Rhythm: Normal  rate and regular rhythm.     Pulses: Normal pulses.     Heart sounds: Normal heart sounds. No murmur heard.    No friction rub. No gallop.  Pulmonary:     Effort: Pulmonary effort is normal. No respiratory distress.     Breath sounds: Normal breath sounds. No stridor. No wheezing, rhonchi or rales.  Chest:     Chest wall: No tenderness.  Musculoskeletal:        General: Normal range of motion.     Cervical back: Normal range of motion and neck supple.  Skin:    General: Skin is warm and dry.     Capillary Refill: Capillary refill takes less than 2 seconds.     Coloration: Skin is not jaundiced or pale.     Findings: No bruising, erythema, lesion or rash.  Neurological:     General: No focal deficit present.     Mental Status: She is alert and oriented to person, place, and time. Mental status is at baseline.  Psychiatric:        Mood and Affect: Mood normal.        Behavior: Behavior normal.        Thought Content: Thought content normal.        Judgment: Judgment normal.     Results for orders placed or performed in visit on 12/08/22  HM DIABETES EYE EXAM   Collection Time: 11/22/22  2:12 PM  Result Value Ref Range   HM Diabetic Eye Exam Retinopathy (A) No Retinopathy      Assessment & Plan:   Problem List Items Addressed This Visit       Genitourinary   Benign hypertensive renal disease - Primary   Under good control on current regimen. Continue current regimen. Continue to monitor. Call with any concerns. Refills given. Too soon for blood work- will recheck in 1 month. Call with any concerns.       Other Visit Diagnoses       Runny nose       Will start flonase. Call with any concerns. Continue to monitor.        Follow up plan: Return in about 4 weeks (around 03/04/2023).

## 2023-02-04 NOTE — Assessment & Plan Note (Signed)
Under good control on current regimen. Continue current regimen. Continue to monitor. Call with any concerns. Refills given. Too soon for blood work- will recheck in 1 month. Call with any concerns.

## 2023-03-02 DIAGNOSIS — E1122 Type 2 diabetes mellitus with diabetic chronic kidney disease: Secondary | ICD-10-CM | POA: Diagnosis not present

## 2023-03-04 ENCOUNTER — Ambulatory Visit: Payer: PPO | Admitting: Family Medicine

## 2023-03-11 ENCOUNTER — Other Ambulatory Visit: Payer: Self-pay

## 2023-03-11 ENCOUNTER — Observation Stay
Admission: EM | Admit: 2023-03-11 | Discharge: 2023-03-12 | Disposition: A | Discharge status: Home / Self Care | Attending: Internal Medicine | Admitting: Internal Medicine

## 2023-03-11 ENCOUNTER — Emergency Department

## 2023-03-11 DIAGNOSIS — G9340 Encephalopathy, unspecified: Principal | ICD-10-CM | POA: Diagnosis present

## 2023-03-11 DIAGNOSIS — F32A Depression, unspecified: Secondary | ICD-10-CM | POA: Insufficient documentation

## 2023-03-11 DIAGNOSIS — E785 Hyperlipidemia, unspecified: Secondary | ICD-10-CM | POA: Insufficient documentation

## 2023-03-11 DIAGNOSIS — F0390 Unspecified dementia without behavioral disturbance: Secondary | ICD-10-CM | POA: Insufficient documentation

## 2023-03-11 DIAGNOSIS — J449 Chronic obstructive pulmonary disease, unspecified: Secondary | ICD-10-CM | POA: Insufficient documentation

## 2023-03-11 DIAGNOSIS — Z96649 Presence of unspecified artificial hip joint: Secondary | ICD-10-CM | POA: Insufficient documentation

## 2023-03-11 DIAGNOSIS — I160 Hypertensive urgency: Secondary | ICD-10-CM | POA: Diagnosis not present

## 2023-03-11 DIAGNOSIS — E11649 Type 2 diabetes mellitus with hypoglycemia without coma: Secondary | ICD-10-CM

## 2023-03-11 DIAGNOSIS — I6782 Cerebral ischemia: Secondary | ICD-10-CM | POA: Diagnosis not present

## 2023-03-11 DIAGNOSIS — R29818 Other symptoms and signs involving the nervous system: Secondary | ICD-10-CM | POA: Diagnosis not present

## 2023-03-11 DIAGNOSIS — Z8673 Personal history of transient ischemic attack (TIA), and cerebral infarction without residual deficits: Secondary | ICD-10-CM | POA: Insufficient documentation

## 2023-03-11 DIAGNOSIS — E162 Hypoglycemia, unspecified: Secondary | ICD-10-CM

## 2023-03-11 DIAGNOSIS — R4182 Altered mental status, unspecified: Secondary | ICD-10-CM | POA: Diagnosis not present

## 2023-03-11 DIAGNOSIS — Z794 Long term (current) use of insulin: Secondary | ICD-10-CM | POA: Diagnosis not present

## 2023-03-11 DIAGNOSIS — E1165 Type 2 diabetes mellitus with hyperglycemia: Secondary | ICD-10-CM | POA: Insufficient documentation

## 2023-03-11 DIAGNOSIS — Z79899 Other long term (current) drug therapy: Secondary | ICD-10-CM | POA: Insufficient documentation

## 2023-03-11 DIAGNOSIS — Z87891 Personal history of nicotine dependence: Secondary | ICD-10-CM | POA: Diagnosis not present

## 2023-03-11 LAB — URINALYSIS, ROUTINE W REFLEX MICROSCOPIC
Bilirubin Urine: NEGATIVE
Glucose, UA: NEGATIVE mg/dL
Hgb urine dipstick: NEGATIVE
Ketones, ur: NEGATIVE mg/dL
Leukocytes,Ua: NEGATIVE
Nitrite: NEGATIVE
Protein, ur: NEGATIVE mg/dL
Specific Gravity, Urine: 1.01 (ref 1.005–1.030)
pH: 5 (ref 5.0–8.0)

## 2023-03-11 LAB — BASIC METABOLIC PANEL
Anion gap: 13 (ref 5–15)
BUN: 29 mg/dL — ABNORMAL HIGH (ref 8–23)
CO2: 25 mmol/L (ref 22–32)
Calcium: 9.5 mg/dL (ref 8.9–10.3)
Chloride: 97 mmol/L — ABNORMAL LOW (ref 98–111)
Creatinine, Ser: 1.21 mg/dL — ABNORMAL HIGH (ref 0.44–1.00)
GFR, Estimated: 40 mL/min — ABNORMAL LOW (ref >60)
Glucose, Bld: 57 mg/dL — ABNORMAL LOW (ref 70–99)
Potassium: 3.6 mmol/L (ref 3.5–5.1)
Sodium: 135 mmol/L (ref 135–145)

## 2023-03-11 LAB — CBC
HCT: 41.4 % (ref 36.0–46.0)
Hemoglobin: 13.8 g/dL (ref 12.0–15.0)
MCH: 31.9 pg (ref 26.0–34.0)
MCHC: 33.3 g/dL (ref 30.0–36.0)
MCV: 95.6 fL (ref 80.0–100.0)
Platelets: 172 10*3/uL (ref 150–400)
RBC: 4.33 MIL/uL (ref 3.87–5.11)
RDW: 13.5 % (ref 11.5–15.5)
WBC: 12.1 10*3/uL — ABNORMAL HIGH (ref 4.0–10.5)
nRBC: 0 % (ref 0.0–0.2)

## 2023-03-11 LAB — CBG MONITORING, ED
Glucose-Capillary: 101 mg/dL — ABNORMAL HIGH (ref 70–99)
Glucose-Capillary: 115 mg/dL — ABNORMAL HIGH (ref 70–99)
Glucose-Capillary: 51 mg/dL — ABNORMAL LOW (ref 70–99)

## 2023-03-11 LAB — TROPONIN I (HIGH SENSITIVITY)
Troponin I (High Sensitivity): 20 ng/L — ABNORMAL HIGH (ref <18)
Troponin I (High Sensitivity): 23 ng/L — ABNORMAL HIGH (ref <18)

## 2023-03-11 MED ORDER — ACETAMINOPHEN 650 MG RE SUPP: 650.0000 mg | Freq: Four times a day (QID) | RECTAL | Status: DC | PRN

## 2023-03-11 MED ORDER — MIRTAZAPINE 15 MG PO TABS: 30.0000 mg | ORAL_TABLET | Freq: Every day | ORAL | Status: DC

## 2023-03-11 MED ORDER — MAGNESIUM HYDROXIDE 400 MG/5ML PO SUSP: 30.0000 mL | Freq: Every day | ORAL | Status: DC | PRN

## 2023-03-11 MED ORDER — ONDANSETRON HCL 4 MG PO TABS: 4.0000 mg | ORAL_TABLET | Freq: Four times a day (QID) | ORAL | Status: DC | PRN

## 2023-03-11 MED ORDER — LABETALOL HCL 5 MG/ML IV SOLN: 20.0000 mg | INTRAVENOUS | Status: DC | PRN

## 2023-03-11 MED ORDER — FLUTICASONE PROPIONATE 50 MCG/ACT NA SUSP: 2.0000 | Freq: Every day | NASAL | Status: DC | PRN

## 2023-03-11 MED ORDER — ACETAMINOPHEN 325 MG PO TABS: 650.0000 mg | ORAL_TABLET | Freq: Four times a day (QID) | ORAL | Status: DC | PRN

## 2023-03-11 MED ORDER — SODIUM CHLORIDE 0.9 % IV SOLN: INTRAVENOUS | Status: DC

## 2023-03-11 MED ORDER — ATORVASTATIN CALCIUM 20 MG PO TABS
40.0000 mg | ORAL_TABLET | Freq: Every day | ORAL | Status: DC
Start: 2023-03-12 — End: 2023-03-12
  Administered 2023-03-12: 40 mg via ORAL
  Filled 2023-03-11: qty 2

## 2023-03-11 MED ORDER — INSULIN ASPART 100 UNIT/ML IJ SOLN
0.0000 [IU] | Freq: Three times a day (TID) | INTRAMUSCULAR | Status: DC
  Administered 2023-03-12: 2 [IU] via SUBCUTANEOUS
  Filled 2023-03-11 (×2): qty 1

## 2023-03-11 MED ORDER — LISINOPRIL 5 MG PO TABS
2.5000 mg | ORAL_TABLET | Freq: Once | ORAL | Status: AC
  Administered 2023-03-11: 2.5 mg via ORAL
  Filled 2023-03-11: qty 1

## 2023-03-11 MED ORDER — ENOXAPARIN SODIUM 30 MG/0.3ML IJ SOSY
30.0000 mg | PREFILLED_SYRINGE | INTRAMUSCULAR | Status: DC
  Administered 2023-03-12: 30 mg via SUBCUTANEOUS
  Filled 2023-03-11: qty 0.3

## 2023-03-11 MED ORDER — ALENDRONATE SODIUM 70 MG PO TABS: 70.0000 mg | ORAL_TABLET | ORAL | Status: DC

## 2023-03-11 MED ORDER — SODIUM CHLORIDE 0.9 % IV SOLN
1.0000 g | INTRAVENOUS | Status: DC
  Administered 2023-03-11: 1 g via INTRAVENOUS
  Filled 2023-03-11: qty 10

## 2023-03-11 MED ORDER — LOSARTAN POTASSIUM 50 MG PO TABS
100.0000 mg | ORAL_TABLET | Freq: Every day | ORAL | Status: DC
  Administered 2023-03-12: 100 mg via ORAL
  Filled 2023-03-11: qty 2

## 2023-03-11 MED ORDER — INSULIN ASPART 100 UNIT/ML IJ SOLN: 0.0000 [IU] | Freq: Every day | INTRAMUSCULAR | Status: DC

## 2023-03-11 MED ORDER — UMECLIDINIUM BROMIDE 62.5 MCG/ACT IN AEPB
1.0000 | INHALATION_SPRAY | Freq: Every day | RESPIRATORY_TRACT | Status: DC
  Administered 2023-03-12: 1 via RESPIRATORY_TRACT
  Filled 2023-03-11 (×2): qty 7

## 2023-03-11 MED ORDER — MONTELUKAST SODIUM 10 MG PO TABS: 10.0000 mg | ORAL_TABLET | Freq: Every day | ORAL | Status: DC

## 2023-03-11 MED ORDER — INSULIN GLARGINE-YFGN 100 UNIT/ML ~~LOC~~ SOLN
8.0000 [IU] | Freq: Every day | SUBCUTANEOUS | Status: DC
  Filled 2023-03-11: qty 0.08

## 2023-03-11 MED ORDER — ONDANSETRON HCL 4 MG/2ML IJ SOLN: 4.0000 mg | Freq: Four times a day (QID) | INTRAMUSCULAR | Status: DC | PRN

## 2023-03-11 MED ORDER — SERTRALINE HCL 50 MG PO TABS
75.0000 mg | ORAL_TABLET | Freq: Every day | ORAL | Status: DC
  Administered 2023-03-12: 75 mg via ORAL
  Filled 2023-03-11: qty 2

## 2023-03-11 MED ORDER — TRAZODONE HCL 50 MG PO TABS: 25.0000 mg | ORAL_TABLET | Freq: Every evening | ORAL | Status: DC | PRN

## 2023-03-11 MED ORDER — HYDROCHLOROTHIAZIDE 12.5 MG PO TABS
12.5000 mg | ORAL_TABLET | Freq: Every day | ORAL | Status: DC
  Administered 2023-03-12: 12.5 mg via ORAL
  Filled 2023-03-11: qty 1

## 2023-03-11 MED ORDER — PROSIGHT PO TABS
1.0000 | ORAL_TABLET | Freq: Two times a day (BID) | ORAL | Status: DC
  Administered 2023-03-12: 1 via ORAL
  Filled 2023-03-11: qty 1

## 2023-03-11 MED ORDER — HYDRALAZINE HCL 20 MG/ML IJ SOLN: 10.0000 mg | Freq: Four times a day (QID) | INTRAMUSCULAR | Status: DC | PRN

## 2023-03-11 MED ORDER — LINAGLIPTIN 5 MG PO TABS
5.0000 mg | ORAL_TABLET | Freq: Every day | ORAL | Status: DC
  Administered 2023-03-12: 5 mg via ORAL
  Filled 2023-03-11: qty 1

## 2023-03-11 NOTE — ED Triage Notes (Signed)
 Pt to ED via POV c/o AMS. Per daughter she was tryong to give pt her nighttim meds at around 7pm and when she did so pt had a blank starte, unable to say her name or tell who daughter was, unable to walk. Pt A&Ox1 (self) at baseline. Pt back to normal at this time. Hx of HTN, stroke, macular degen. Denies CP, SHOB, dizziness

## 2023-03-11 NOTE — Assessment & Plan Note (Signed)
-   The patient will be placed on as needed IV labetalol. - We will continue her antihypertensive therapy.

## 2023-03-11 NOTE — Assessment & Plan Note (Signed)
-   This likely secondary to hypertensive urgency and hypoglycemia. - The patient will be admitted to a progressive unit observation bed. - We will follow neurochecks every 4 hours for 24 hours. - Management otherwise as below. - Will monitor mental status.

## 2023-03-11 NOTE — Assessment & Plan Note (Addendum)
-   We will continue Zoloft and Remeron.

## 2023-03-11 NOTE — Assessment & Plan Note (Signed)
 -  We will continue statin therapy.

## 2023-03-11 NOTE — ED Notes (Signed)
 Pt given orange juice.

## 2023-03-11 NOTE — ED Notes (Signed)
 CCMD aware of monitoring.

## 2023-03-11 NOTE — ED Provider Notes (Signed)
 [facility] Provider Note    Event Date/Time   First MD Initiated Contact with Patient 03/11/23 2151     (approximate)   History   Altered Mental Status   HPI  Angela Neal is a 89 y.o. female with a history of hypertension diabetes with insulin use  At about 7 PM daughter was getting her ready for bed when she noticed that she was sort of just blankly staring not as interactive as typical.  This lasted about 2 hours.  Blood sugar was 60 at the house which daughter reports is not particularly low, she has had lower with less profound symptoms.  Due to the ongoing nature of her symptoms she brought her to the ER for evaluation.  She did have something to eat and juice.  Now seeing her at approximately 10 PM her symptoms of all resolved she is now speaking normally and talking to her daughter normally acting behaving normally.  There was no facial droop the patient did not complain of anything.  Her speech was not slurred.  She was just kind of "blankly staring" for about an hour or 2.  Daughter reports she had a similar hospitalization at [facility], couple months ago and they attributed some of the symptoms possibly due to high blood pressure     Physical Exam   Triage Vital Signs: ED Triage Vitals  Encounter Vitals Group     BP 03/11/23 2022 (!) 219/102     Systolic BP Percentile --      Diastolic BP Percentile --      Pulse Rate 03/11/23 2022 95     Resp 03/11/23 2022 20     Temp 03/11/23 2041 97.6 F (36.4 C)     Temp Source 03/11/23 2041 Oral     SpO2 03/11/23 2022 98 %     Weight --      Height --      Head Circumference --      Peak Flow --      Pain Score 03/11/23 2022 0     Pain Loc --      Pain Education --      Exclude from Growth Chart --     Most recent vital signs: Vitals:   03/11/23 2152 03/11/23 2154  BP: (!) 232/88   Pulse: 83   Resp: 20   Temp:    SpO2: 97% 97%     General: Awake, no distress.  Alert oriented very  pleasant communicates with her daughter speaks clearly.  Moves all extremities 5 out of 5 strength no facial droop.  Extraocular movements normal CV:  Good peripheral perfusion.  Normal rate.  Severe holosystolic murmur Resp:  Normal effort.  Clear bilateral Abd:  No distention.  Soft nontender nondistended Other:  No lower extremity edema.  Patient's daughter reports amlodipine had a unfortunate side effect of coughing significant edema for her historically   ED Results / Procedures / Treatments   Labs (all labs ordered are listed, but only abnormal results are displayed) Labs Reviewed  BASIC METABOLIC PANEL - Abnormal; Notable for the following components:      Result Value   Chloride 97 (*)    Glucose, Bld 57 (*)    BUN 29 (*)    Creatinine, Ser 1.21 (*)    GFR, Estimated 40 (*)    All other components within normal limits  CBC - Abnormal; Notable for the following components:   WBC 12.1 (*)  All other components within normal limits  URINALYSIS, ROUTINE W REFLEX MICROSCOPIC - Abnormal; Notable for the following components:   Color, Urine STRAW (*)    APPearance CLEAR (*)    All other components within normal limits  CBG MONITORING, ED - Abnormal; Notable for the following components:   Glucose-Capillary 51 (*)    All other components within normal limits  CBG MONITORING, ED - Abnormal; Notable for the following components:   Glucose-Capillary 101 (*)    All other components within normal limits  TROPONIN I (HIGH SENSITIVITY) - Abnormal; Notable for the following components:   Troponin I (High Sensitivity) 20 (*)    All other components within normal limits  CBG MONITORING, ED  CBG MONITORING, ED  CBG MONITORING, ED  CBG MONITORING, ED  TROPONIN I (HIGH SENSITIVITY)   Labs notable for hypoglycemia this has been corrected up to 101 after oral glucose containing solutions.  Very mild leukocytosis.  Creatinine 1.2  EKG  Interpreted me at 2040 heart rate 90 QRS 90 QTc  470 Left ventricular hypertrophy.  Probable repolarization abnormality.  No STEMI   RADIOLOGY  No results found.   CT head report pending final read by radiologist.  To my gross inspection no acute intracranial hemorrhage or obvious large mass effect is noted   PROCEDURES:  Critical Care performed: No  Procedures   MEDICATIONS ORDERED IN ED: Medications  lisinopril (ZESTRIL) tablet 2.5 mg (has no administration in time range)     IMPRESSION / MDM / ASSESSMENT AND PLAN / ED COURSE  I reviewed the triage vital signs and the nursing notes.                              Differential diagnosis includes, but is not limited to, possible hypertensive urgency versus emergency or hypoglycemic episode, less likely acute central neurologic process such as TIA or stroke she did not have any obvious focal symptoms.  Her symptoms have improved now her blood pressure remains hypertensive but her glucose has improved.  Consider possible episode of hypoglycemia, but also hypertensive urgency is strongly considered.  She is alert and oriented now complains of no headache no nausea no vomiting no central chest pain or other features to highly suggest hypertensive emergency noted at this time.  CT head no obvious findings but radiology with significant delays recently final read is pending at time of admission decision  Patient's presentation is most consistent with acute complicated illness / injury requiring diagnostic workup.   Given the patient's clinical history previous medications taken will trial low-dose lisinopril as an initial step to's gain additional blood pressure control.  Suspect hydrochlorothiazide would be somewhat slow in its onset effect, she is already taking losartan and has previously tried amlodipine without success.  ----------------------------------------- 10:32 PM on 03/11/2023 ----------------------------------------- Patient's family and patient understand agreeable  with plan for admission for further observation treatment and evaluation under the medicine service.  Is excepted to admission by Dr. Arville Care       FINAL CLINICAL IMPRESSION(S) / ED DIAGNOSES   Final diagnoses:  Altered mental status, unspecified altered mental status type  Hypertensive urgency  Hypoglycemia     Rx / DC Orders   ED Discharge Orders     None        Note:  This document was prepared using Dragon voice recognition software and may include unintentional dictation errors.   Sharyn Creamer, MD 03/11/23 2233

## 2023-03-11 NOTE — ED Notes (Signed)
CBG 51 

## 2023-03-11 NOTE — Assessment & Plan Note (Signed)
-   We will hold off long-acting basal coverage with [place]. - The patient will be placed on supplemental coverage with sensitive NovoLog.

## 2023-03-11 NOTE — H&P (Signed)
 [place]   PATIENT NAME: Angela Neal    MR#:  [id number]  DATE OF BIRTH:  January 26, 1921  DATE OF ADMISSION:  03/11/2023  PRIMARY CARE PHYSICIAN: Angela Carrow, DO   Patient is coming from: Home  REQUESTING/REFERRING PHYSICIAN: Sharyn Creamer, MD  CHIEF COMPLAINT:   Chief Complaint  Patient presents with   Altered Mental Status    HISTORY OF PRESENT ILLNESS:  Angela Neal is a 89 y.o. Caucasian female with medical history significant for asthma, COPD, type 2 diabetes mellitus, hypertension and CVA, who presented to the emergency room with a Angela Neal of altered mental status.  The patient was watching TV around 7 PM his daughter noticed that he was starting and not responding to her.  He did not recognize her either.  She denied any diaphoresis or jitteriness.  His blood pressure was 187/81 with heart rate of 90 and blood sugar was 60.  She given half a glass of orange juice.  He was still not able to stand.  No reported nausea or vomiting or diarrhea.  No chest pain or palpitations.  No cough or wheezing.  The patient is a fairly poor historian due to altered mental status.  ED Course: When the patient came to the ER, BP was 219/102 and later 232/88 with otherwise normal vital signs.  Labs revealed a chloride of 97 with a blood glucose of 57 and BUN of 29 with creatinine 1.21.  High sensitive troponin I was 20 and later 23 and CBC showed leukocytosis of 12.1.  UA was unremarkable. EKG as reviewed by me : EKG showed normal sinus rhythm with a rate of 88 and LVH with repolarization abnormality and T wave inversion laterally. Imaging: Noncontrasted CT scan revealed no acute intracranial abnormalities.  It showed remote left frontal infarct and chronic microvascular ischemic disease.  The patient was given 2.5 mg of Neal.o. lisinopril.  She will be admitted to a progressive unit observation bed for further evaluation and management. PAST MEDICAL HISTORY:   Past Medical History:   Diagnosis Date   Asthma    COPD (chronic obstructive pulmonary disease) (HCC)    Diabetes mellitus without complication (HCC)    Hypertension    Macular degeneration    Stroke (HCC)   -Dementia  PAST SURGICAL HISTORY:   Past Surgical History:  Procedure Laterality Date   ABDOMINAL HYSTERECTOMY     HIP ARTHROPLASTY      SOCIAL HISTORY:   Social History   Tobacco Use   Smoking status: Former    Current packs/day: 0.00    Average packs/day: 1.3 packs/day for 10.0 years (12.5 ttl pk-yrs)    Types: Cigarettes    Start date: 29    Quit date: 30    Years since quitting: 70.2   Smokeless tobacco: Never  Substance Use Topics   Alcohol use: Never    FAMILY HISTORY:   Family History  Problem Relation Age of Onset   Arthritis Mother    Heart disease Mother    Heart attack Father 54    DRUG ALLERGIES:   Allergies  Allergen Reactions   Sulfa Antibiotics Anaphylaxis   Amlodipine Other (See Comments)    Leg swelling    REVIEW OF SYSTEMS:   ROS As per history of present illness. All pertinent systems were reviewed above. Constitutional, HEENT, cardiovascular, respiratory, GI, GU, musculoskeletal, neuro, psychiatric, endocrine, integumentary and hematologic systems were reviewed and are otherwise negative/unremarkable except for positive findings mentioned above  in the HPI.   MEDICATIONS AT HOME:   Prior to Admission medications   Medication Sig Start Date End Date Taking? Authorizing Provider  alendronate (FOSAMAX) 70 MG tablet Take by mouth. 09/21/22 09/21/23 Yes [provider]  atorvastatin (LIPITOR) 40 MG tablet TAKE 1 TABLET BY MOUTH EVERY DAY 11/17/22  Yes Angela Neal, Angela P, DO  fluticasone (FLONASE) 50 MCG/ACT nasal spray Place 2 sprays into both nostrils daily. 02/04/23  Yes Angela Neal, Angela P, DO  GNP NATURAL FIBER 0.52 g capsule TAKE 1 CAPSULE BY MOUTH ONCE DAILY AT NOON 03/19/22  Yes Angela Neal, Angela P, DO  GNP VITAMIN D3 EXTRA STRENGTH 25 MCG (1000  UT) tablet TAKE 1 TABLET BY MOUTH ONCE EVERY DAY FOR SUPPLEMENT 11/19/21  Yes Angela Neal, Angela P, DO  hydrochlorothiazide (MICROZIDE) 12.5 MG capsule Take 1 capsule (12.5 mg total) by mouth daily. 02/04/23 03/11/23 Yes Angela Neal, Angela P, DO  JANUVIA 100 MG tablet Take 1 tablet (100 mg total) by mouth daily. 11/17/22  Yes Angela Neal, Angela P, DO  LEVEMIR FLEXTOUCH 100 UNIT/ML FlexTouch Pen Inject 8 Units into the skin at bedtime. 08/17/22 03/11/23 Yes Angela Neal, Angela Needs, Angela Neal  losartan (COZAAR) 100 MG tablet Take 1 tablet (100 mg total) by mouth daily. 11/17/22  Yes Angela Neal, Angela P, DO  mirtazapine (REMERON) 30 MG tablet Take 1 tablet (30 mg total) by mouth at bedtime. 11/17/22  Yes Angela Neal, Angela P, DO  montelukast (SINGULAIR) 10 MG tablet Take 1 tablet (10 mg total) by mouth at bedtime. TAKE 1 TABLET(10 MG) BY MOUTH NIGHTLY 11/17/22  Yes Angela Neal, Angela P, DO  Multiple Vitamins-Minerals (GNP HEALTHY EYES SUPERVISION 2) CAPS TAKE 1 CAPSULE BY MOUTH 2 TIMES DAILY 05/25/21  Yes Angela Neal, Angela P, DO  NOVOLOG FLEXPEN 100 UNIT/ML FlexPen Inject 4 Units into the skin 3 (three) times daily before meals. (plus up to 5u sliding scale correction) 04/22/22  Yes Angela Neal, Angela P, DO  sertraline (ZOLOFT) 50 MG tablet Take 1.5 tablets (75 mg total) by mouth daily. 11/17/22  Yes Angela Neal, Angela P, DO  Tiotropium Bromide Monohydrate 2.5 MCG/ACT AERS Inhale 3 puffs into the lungs 2 (two) times daily as needed. 07/20/22 07/20/23 Yes [provider]  Continuous Glucose Receiver (FREESTYLE LIBRE 3 READER) DEVI Use 1 each as directed 12/22/22   [provider]  lidocaine (LIDODERM) 5 % Place 1 patch onto the skin daily. Remove & Discard patch within 12 hours or as directed by MD Patient not taking: Reported on 03/11/2023 12/22/21   Angela Neal P, DO  naproxen (NAPROSYN) 500 MG tablet Take 1 tablet (500 mg total) by mouth 2 (two) times daily with a meal. Patient not taking: Reported on 03/11/2023 12/22/21   Angela Neal P, DO  TRESIBA FLEXTOUCH 100 UNIT/ML FlexTouch Pen Inject into the skin at bedtime. 01/24/23   [provider]  TRUE METRIX BLOOD GLUCOSE TEST test strip TEST BLOOD SUGAR 4 TIMES DAILY BEFORE LEVEMIR IS GIVEN OR AS DIRECTED BY PHYSICIAN 04/10/21   Angela Neal, Angela P, DO  TRUEplus Safety Lancets 28G MISC USE FOR FINGER STICK BLOOD SUGARS (CURRENTLY 4 TIMES DAILY) 06/19/21   Angela Neal, Angela P, DO  ULTRACARE PEN NEEDLES 33G X 4 MM MISC USE WITH INSULIN PENS AS DIRECTED BY PHYSICIAN (CURRENTLY 4 TIMES DAILY) 03/25/22   Angela Neal, Angela P, DO      VITAL SIGNS:  Blood pressure (!) 186/93, pulse 88, temperature 97.6 F (36.4 C), temperature source Oral, resp. rate 16, SpO2 96%.  PHYSICAL EXAMINATION:  Physical  Exam  GENERAL:  89 y.o.-year-old female patient lying in the bed with no acute distress.  EYES: Pupils equal, round, reactive to light and accommodation. No scleral icterus. Extraocular muscles intact.  HEENT: Head atraumatic, normocephalic. Oropharynx and nasopharynx clear.  NECK:  Supple, no jugular venous distention. No thyroid enlargement, no tenderness.  LUNGS: Normal breath sounds bilaterally, no wheezing, rales,rhonchi or crepitation. No use of accessory muscles of respiration.  CARDIOVASCULAR: Regular rate and rhythm, S1, S2 normal. No murmurs, rubs, or gallops.  ABDOMEN: Soft, nondistended, nontender. Bowel sounds present. No organomegaly or mass.  EXTREMITIES: No pedal edema, cyanosis, or clubbing.  NEUROLOGIC: Cranial nerves II through XII are intact. Muscle strength 5/5 in all extremities. Sensation intact. Gait not checked.  PSYCHIATRIC: The patient is alert and oriented x 2 to place and person but not to time.  Normal affect and good eye contact. SKIN: No obvious rash, lesion, or ulcer.   LABORATORY PANEL:   CBC Recent Labs  Lab 03/11/23 2028  WBC 12.1*  HGB 13.8  HCT 41.4  PLT 172    ------------------------------------------------------------------------------------------------------------------  Chemistries  Recent Labs  Lab 03/11/23 2028  NA 135  K 3.6  CL 97*  CO2 25  GLUCOSE 57*  BUN 29*  CREATININE 1.21*  CALCIUM 9.5   ------------------------------------------------------------------------------------------------------------------  Cardiac Enzymes No results for input(s): "TROPONINI" in the last 168 hours. ------------------------------------------------------------------------------------------------------------------  RADIOLOGY:  CT Head Wo Contrast Result Date: 03/11/2023 CLINICAL DATA:  Neuro deficit, acute, stroke suspected EXAM: CT HEAD WITHOUT CONTRAST TECHNIQUE: Contiguous axial images were obtained from the base of the skull through the vertex without intravenous contrast. RADIATION DOSE REDUCTION: This exam was performed according to the departmental dose-optimization program which includes automated exposure control, adjustment of the mA and/or kV according to patient size and/or use of iterative reconstruction technique. COMPARISON:  CT head 12/10/2018 FINDINGS: Brain: No evidence of acute infarction, hemorrhage, hydrocephalus, extra-axial collection or mass lesion/mass effect. Moderate patchy white matter hypodensities, compatible with chronic microvascular disease. Remote left frontal infarct. Vascular: Calcific atherosclerosis. Skull: No acute fracture. Sinuses/Orbits: Moderate left maxillary sinus mucosal thickening. No acute orbital findings. Other: No mastoid effusions. IMPRESSION: 1. No evidence of acute intracranial abnormality. 2. Remote left frontal infarct and chronic microvascular ischemic disease. Electronically Signed   By: Feliberto Harts M.D.   On: 03/11/2023 23:10      IMPRESSION AND PLAN:  Assessment and Plan: * Acute encephalopathy - This likely secondary to hypertensive urgency and hypoglycemia. - The patient will be  admitted to a progressive unit observation bed. - We will follow neurochecks every 4 hours for 24 hours. - Management otherwise as below. - Will monitor mental status.  Uncontrolled type 2 diabetes mellitus with hypoglycemia, with long-term current use of insulin (HCC) - We will hold off long-acting basal coverage with [place]. - The patient will be placed on supplemental coverage with sensitive NovoLog.  Hypertensive urgency - The patient will be placed on as needed IV labetalol. - We will continue her antihypertensive therapy.  Depression - We will continue Zoloft and Remeron.  Dyslipidemia - We will continue statin therapy.   DVT prophylaxis: Lovenox.  Advanced Care Planning:  Code Status: Patient is DNR and DNI. Family Communication:  The plan of care was discussed in details with the patient (and family). I answered all questions. The patient agreed to proceed with the above mentioned plan. Further management will depend upon hospital course. Disposition Plan: Back to previous home environment Consults called: none. All the records  are reviewed and case discussed with ED provider.  Status is: Observation  I certify that at the time of admission, it is my clinical judgment that the patient will require  hospital care extending less than 2 midnights.                            Dispo: The patient is from: Home              Anticipated d/c is to: Home              Patient currently is not medically stable to d/c.              Difficult to place patient: No  Hannah Beat M.D on 03/11/2023 at 11:32 PM  [facility]   From 7 PM-7 AM, contact night-coverage [email or web address]  CC: Primary care physician; Angela Carrow, DO

## 2023-03-11 NOTE — ED Provider Notes (Signed)
 CT Head Wo Contrast Result Date: 03/11/2023 CLINICAL DATA:  Neuro deficit, acute, stroke suspected EXAM: CT HEAD WITHOUT CONTRAST TECHNIQUE: Contiguous axial images were obtained from the base of the skull through the vertex without intravenous contrast. RADIATION DOSE REDUCTION: This exam was performed according to the departmental dose-optimization program which includes automated exposure control, adjustment of the mA and/or kV according to patient size and/or use of iterative reconstruction technique. COMPARISON:  CT head 12/10/2018 FINDINGS: Brain: No evidence of acute infarction, hemorrhage, hydrocephalus, extra-axial collection or mass lesion/mass effect. Moderate patchy white matter hypodensities, compatible with chronic microvascular disease. Remote left frontal infarct. Vascular: Calcific atherosclerosis. Skull: No acute fracture. Sinuses/Orbits: Moderate left maxillary sinus mucosal thickening. No acute orbital findings. Other: No mastoid effusions. IMPRESSION: 1. No evidence of acute intracranial abnormality. 2. Remote left frontal infarct and chronic microvascular ischemic disease. Electronically Signed   By: Feliberto Harts M.D.   On: 03/11/2023 23:10       Sharyn Creamer, MD 03/22/23 [phone]

## 2023-03-12 DIAGNOSIS — I160 Hypertensive urgency: Secondary | ICD-10-CM | POA: Diagnosis not present

## 2023-03-12 DIAGNOSIS — G9340 Encephalopathy, unspecified: Secondary | ICD-10-CM | POA: Diagnosis not present

## 2023-03-12 DIAGNOSIS — E11649 Type 2 diabetes mellitus with hypoglycemia without coma: Secondary | ICD-10-CM | POA: Diagnosis not present

## 2023-03-12 DIAGNOSIS — E785 Hyperlipidemia, unspecified: Secondary | ICD-10-CM | POA: Diagnosis not present

## 2023-03-12 DIAGNOSIS — Z794 Long term (current) use of insulin: Secondary | ICD-10-CM | POA: Diagnosis not present

## 2023-03-12 LAB — BASIC METABOLIC PANEL
Anion gap: 15 (ref 5–15)
BUN: 23 mg/dL (ref 8–23)
CO2: 22 mmol/L (ref 22–32)
Calcium: 8.8 mg/dL — ABNORMAL LOW (ref 8.9–10.3)
Chloride: 99 mmol/L (ref 98–111)
Creatinine, Ser: 0.88 mg/dL (ref 0.44–1.00)
GFR, Estimated: 59 mL/min — ABNORMAL LOW (ref >60)
Glucose, Bld: 208 mg/dL — ABNORMAL HIGH (ref 70–99)
Potassium: 3.7 mmol/L (ref 3.5–5.1)
Sodium: 136 mmol/L (ref 135–145)

## 2023-03-12 LAB — HEMOGLOBIN A1C
Hgb A1c MFr Bld: 6.4 % — ABNORMAL HIGH (ref 4.8–5.6)
Mean Plasma Glucose: 136.98 mg/dL

## 2023-03-12 LAB — CBC
HCT: 37.8 % (ref 36.0–46.0)
Hemoglobin: 12.6 g/dL (ref 12.0–15.0)
MCH: 31.6 pg (ref 26.0–34.0)
MCHC: 33.3 g/dL (ref 30.0–36.0)
MCV: 94.7 fL (ref 80.0–100.0)
Platelets: 133 10*3/uL — ABNORMAL LOW (ref 150–400)
RBC: 3.99 MIL/uL (ref 3.87–5.11)
RDW: 13.4 % (ref 11.5–15.5)
WBC: 9.5 10*3/uL (ref 4.0–10.5)
nRBC: 0 % (ref 0.0–0.2)

## 2023-03-12 LAB — CBG MONITORING, ED
Glucose-Capillary: 186 mg/dL — ABNORMAL HIGH (ref 70–99)
Glucose-Capillary: 186 mg/dL — ABNORMAL HIGH (ref 70–99)
Glucose-Capillary: 187 mg/dL — ABNORMAL HIGH (ref 70–99)
Glucose-Capillary: 209 mg/dL — ABNORMAL HIGH (ref 70–99)
Glucose-Capillary: 220 mg/dL — ABNORMAL HIGH (ref 70–99)

## 2023-03-12 NOTE — Progress Notes (Signed)
 PHARMACIST - PHYSICIAN COMMUNICATION  CONCERNING:  Enoxaparin (Lovenox) for DVT Prophylaxis    RECOMMENDATION: Patient was prescribed enoxaprin 40mg  q24 hours for VTE prophylaxis.   There were no vitals filed for this visit.  There is no height or weight on file to calculate BMI.  CrCl cannot be calculated (Unknown ideal weight.).  Patient is candidate for enoxaparin 30mg  every 24 hours based on CrCl <86ml/min or Weight <45kg  DESCRIPTION: Pharmacy has adjusted enoxaparin dose per [facility] policy.  Patient is now receiving enoxaparin 30 mg every 24 hours   Otelia Sergeant, PharmD, [facility] 03/12/2023 1:36 AM

## 2023-03-12 NOTE — ED Notes (Signed)
 Pt up to bedside toilet. Tolerated well. Pt back to bed without incident.

## 2023-03-12 NOTE — Discharge Instructions (Signed)
 Keep log of blood sugars and blood pressure at home. Hold long-acting insulin till further instructions by endocrinology Dr. Tedd Sias

## 2023-03-12 NOTE — Discharge Summary (Signed)
 Physician Discharge Summary   Patient: Angela Neal MRN: [id number] DOB: [birth year]  Admit date:     03/11/2023  Discharge date: 03/12/23  Discharge Physician: Enedina Finner   PCP: Dorcas Carrow, DO   Recommendations at discharge:    Keep log of blood sugars and blood pressure at home. Hold long-acting insulin till further instructions by endocrinology Dr. Tedd Sias follow-up PCP in 1 to 2 weeks  Discharge Diagnoses: Principal Problem:   Acute encephalopathy Active Problems:   Uncontrolled type 2 diabetes mellitus with hypoglycemia, with long-term current use of insulin (HCC)   Hypertensive urgency   Dyslipidemia   Depression  Angela Neal is a 89 y.o. Caucasian female with medical history significant for asthma, COPD, type 2 diabetes mellitus, hypertension and CVA, who presented to the emergency room with c/o altered mental status. Patient was ready to have elevated blood pressure in the ER 219/102 later 232/88 and her sugar was down to 60.  Noncontrasted CT scan revealed no acute intracranial abnormalities.  It showed remote left frontal infarct and chronic microvascular ischemic disease.  UA was unremarkable.   Acute metabolic encephalopathy improved -- suspected due to hypertensive urgency and hypoglycemia -- patient's mentation is back to baseline -- she is hard on hearing and has macular degeneration -- blood pressure is stable and now back on her home meds  Hypertensive urgency -- received PRN IV labetalol and one dose of lisinopril -- currently on home medicine with losartan and hydrochlorothiazide -- patient's daughter to keep log of blood pressure at home and discussed with PCP  Type II diabetes, hyperglycemia, hypoglycemia with long-term use of insulin -- A1c 6.4 -- will hold long-acting insulin till seen by Dr. Tedd Sias -- continue Januvia -- continue sliding scale insulin for sugars greater than 250 this was discussed with patient's daughter -- sugars are  improved  Dyslipidemia -- on statins  Overall fairly independent at home uses walker. Patient lives with daughter and son-in-law at home. Discharge plan with daughter at bedside she is in agreement.      Disposition: Home Diet recommendation:  Discharge Diet Orders (From admission, onward)     Start     Ordered   03/12/23 0000  Diet - low sodium heart healthy        03/12/23 1439           Cardiac and Carb modified diet DISCHARGE MEDICATION: Allergies as of 03/12/2023       Reactions   Sulfa Antibiotics Anaphylaxis   Amlodipine Other (See Comments)   Leg swelling        Medication List     PAUSE taking these medications    Levemir FlexTouch 100 UNIT/ML FlexTouch Pen Wait to take this until your doctor or other care provider tells you to start again. Generic drug: insulin detemir Inject 8 Units into the skin at bedtime.   Evaristo Bury FlexTouch 100 UNIT/ML FlexTouch Pen Wait to take this until your doctor or other care provider tells you to start again. Generic drug: insulin degludec Inject into the skin at bedtime.       STOP taking these medications    lidocaine 5 % Commonly known as: Lidoderm   naproxen 500 MG tablet Commonly known as: Naprosyn       TAKE these medications    alendronate 70 MG tablet Commonly known as: FOSAMAX Take by mouth.   atorvastatin 40 MG tablet Commonly known as: LIPITOR TAKE 1 TABLET BY MOUTH EVERY DAY   fluticasone 50  MCG/ACT nasal spray Commonly known as: FLONASE Place 2 sprays into both nostrils daily.   FreeStyle Libre 3 Reader [employer] Use 1 each as directed   [employer] Healthy Eyes SuperVision 2 Caps TAKE 1 CAPSULE BY MOUTH 2 TIMES DAILY   GNP Natural Fiber 0.52 g capsule Generic drug: psyllium TAKE 1 CAPSULE BY MOUTH ONCE DAILY AT NOON   GNP Vitamin D3 Extra Strength 25 MCG (1000 UT) tablet Generic drug: Cholecalciferol TAKE 1 TABLET BY MOUTH ONCE EVERY DAY FOR SUPPLEMENT   hydrochlorothiazide 12.5 MG  capsule Commonly known as: MICROZIDE Take 1 capsule (12.5 mg total) by mouth daily.   Januvia 100 MG tablet Generic drug: sitaGLIPtin Take 1 tablet (100 mg total) by mouth daily.   losartan 100 MG tablet Commonly known as: COZAAR Take 1 tablet (100 mg total) by mouth daily.   mirtazapine 30 MG tablet Commonly known as: REMERON Take 1 tablet (30 mg total) by mouth at bedtime.   montelukast 10 MG tablet Commonly known as: SINGULAIR Take 1 tablet (10 mg total) by mouth at bedtime. TAKE 1 TABLET(10 MG) BY MOUTH NIGHTLY   NovoLOG FlexPen 100 UNIT/ML FlexPen Generic drug: insulin aspart Inject 4 Units into the skin 3 (three) times daily before meals. (plus up to 5u sliding scale correction)   sertraline 50 MG tablet Commonly known as: ZOLOFT Take 1.5 tablets (75 mg total) by mouth daily.   Tiotropium Bromide Monohydrate 2.5 MCG/ACT Aers Inhale 3 puffs into the lungs 2 (two) times daily as needed.   True Metrix Blood Glucose Test test strip Generic drug: glucose blood TEST BLOOD SUGAR 4 TIMES DAILY BEFORE LEVEMIR IS GIVEN OR AS DIRECTED BY PHYSICIAN   TRUEplus Safety Lancets 28G Misc USE FOR FINGER STICK BLOOD SUGARS (CURRENTLY 4 TIMES DAILY)   Ultracare Pen Needles 33G X 4 MM Misc Generic drug: Insulin Pen Needle USE WITH INSULIN PENS AS DIRECTED BY PHYSICIAN (CURRENTLY 4 TIMES DAILY)        Follow-up Information     Johnson, Megan P, DO. Schedule an appointment as soon as possible for a visit in 1 week(s).   Specialty: Family Medicine Contact information: [address] [place] [address] [phone]         Raj Janus, MD. Go to.   Specialty: Endocrinology Why: On your scheduled appointment Contact information: [address] [facility] [place] [address] [phone]                Discharge Exam: alert and oriented times three. Hard on hearing has macular degeneration cardiovascular both heart sounds normal respiratory  clear to auscultation neuro- grossly intact Condition at discharge: fair  The results of significant diagnostics from this hospitalization (including imaging, microbiology, ancillary and laboratory) are listed below for reference.   Imaging Studies: CT Head Wo Contrast Result Date: 03/11/2023 CLINICAL DATA:  Neuro deficit, acute, stroke suspected EXAM: CT HEAD WITHOUT CONTRAST TECHNIQUE: Contiguous axial images were obtained from the base of the skull through the vertex without intravenous contrast. RADIATION DOSE REDUCTION: This exam was performed according to the departmental dose-optimization program which includes automated exposure control, adjustment of the mA and/or kV according to patient size and/or use of iterative reconstruction technique. COMPARISON:  CT head 12/10/2018 FINDINGS: Brain: No evidence of acute infarction, hemorrhage, hydrocephalus, extra-axial collection or mass lesion/mass effect. Moderate patchy white matter hypodensities, compatible with chronic microvascular disease. Remote left frontal infarct. Vascular: Calcific atherosclerosis. Skull: No acute fracture. Sinuses/Orbits: Moderate left maxillary sinus  mucosal thickening. No acute orbital findings. Other: No mastoid effusions. IMPRESSION: 1. No evidence of acute intracranial abnormality. 2. Remote left frontal infarct and chronic microvascular ischemic disease. Electronically Signed   By: Feliberto Harts M.D.   On: 03/11/2023 23:10    Microbiology: Results for orders placed or performed in visit on 04/20/22  Microscopic Examination     Status: None   Collection Time: 04/20/22  1:30 PM   BLD  Result Value Ref Range Status   WBC, UA None seen 0 - 5 /hpf Final   RBC, Urine 0-2 0 - 2 /hpf Final   Epithelial Cells (non renal) 0-10 0 - 10 /hpf Final   Bacteria, UA None seen None seen/Few Final    Labs: CBC: Recent Labs  Lab 03/11/23 2028 03/12/23 0456  WBC 12.1* 9.5  HGB 13.8 12.6  HCT 41.4 37.8  MCV 95.6 94.7   PLT 172 133*   Basic Metabolic Panel: Recent Labs  Lab 03/11/23 2028 03/12/23 0456  NA 135 136  K 3.6 3.7  CL 97* 99  CO2 25 22  GLUCOSE 57* 208*  BUN 29* 23  CREATININE 1.21* 0.88  CALCIUM 9.5 8.8*    CBG: Recent Labs  Lab 03/12/23 0337 03/12/23 0454 03/12/23 0729 03/12/23 1126 03/12/23 1335  GLUCAP 186* 186* 187* 209* 220*    Discharge time spent: greater than 30 minutes.  Signed: Enedina Finner, MD [facility] 03/12/2023

## 2023-03-14 ENCOUNTER — Telehealth: Payer: Self-pay

## 2023-03-14 NOTE — Transitions of Care (Post Inpatient/ED Visit) (Signed)
 03/14/2023  Name: Angela Neal MRN: [id number] DOB: 1921-03-30  Today's TOC FU Call Status: Today's TOC FU Call Status:: Successful TOC FU Call Completed TOC FU Call Complete Date: 03/14/23 Patient's Name and Date of Birth confirmed.  Transition Care Management Follow-up Telephone Call Date of Discharge: 03/12/23 Discharge Facility: [facility]) Type of Discharge: Emergency Department Reason for ED Visit: Other: (hypertensive urgency) How have you been since you were released from the hospital?: Better Any questions or concerns?: No  Items Reviewed: Did you receive and understand the discharge instructions provided?: Yes Medications obtained,verified, and reconciled?: Yes (Medications Reviewed) Any new allergies since your discharge?: No Dietary orders reviewed?: Yes Do you have support at home?: Yes People in Home: child(ren), adult  Medications Reviewed Today: Medications Reviewed Today     Reviewed by Karena Addison, LPN (Licensed Practical Nurse) on 03/14/23 at 1452  Med List Status: <None>   Medication Order Taking? Sig Documenting Provider Last Dose Status Informant  alendronate (FOSAMAX) 70 MG tablet [id number] No Take by mouth. [provider] Taking Active Child           Med Note Tora Kindred   Tue Jan 25, 2023  1:59 PM) weekly  atorvastatin (LIPITOR) 40 MG tablet [id number] No TAKE 1 TABLET BY MOUTH EVERY DAY Dorcas Carrow, DO 03/11/2023 Active Child  Continuous Glucose Receiver (FREESTYLE LIBRE 3 READER) DEVI [id number] No Use 1 each as directed [provider] Taking Active Child  fluticasone (FLONASE) 50 MCG/ACT nasal spray [id number]  Place 2 sprays into both nostrils daily. Dorcas Carrow, DO  Active Child           Med Note Katrinka Blazing, Alona Bene   Fri Mar 11, 2023 10:39 PM) prn  [facility] NATURAL FIBER 0.52 g capsule [id number] No TAKE 1 CAPSULE BY MOUTH ONCE DAILY AT Adella Nissen, Megan P, DO 03/11/2023 Active Child            Med Note Maisie Fus, PAULA   Tue Jan 25, 2023  2:00 PM) Twice daily  GNP VITAMIN D3 EXTRA STRENGTH 25 MCG (1000 UT) tablet [id number] No TAKE 1 TABLET BY MOUTH ONCE EVERY DAY FOR SUPPLEMENT Dorcas Carrow, DO 03/11/2023 Active Child  hydrochlorothiazide (MICROZIDE) 12.5 MG capsule [id number]  Take 1 capsule (12.5 mg total) by mouth daily. Olevia Perches P, DO  Expired 03/11/23 2359 Child  JANUVIA 100 MG tablet [id number] No Take 1 tablet (100 mg total) by mouth daily. Olevia Perches P, DO 03/11/2023 Active Child  LEVEMIR FLEXTOUCH 100 UNIT/ML FlexTouch Pen [id number] No Inject 8 Units into the skin at bedtime. Weber Cooks, NP 03/11/2023 Expired 03/11/23 2359 Child  losartan (COZAAR) 100 MG tablet [id number] No Take 1 tablet (100 mg total) by mouth daily. Olevia Perches P, DO 03/11/2023 Active Child  mirtazapine (REMERON) 30 MG tablet [id number] No Take 1 tablet (30 mg total) by mouth at bedtime. Dorcas Carrow, DO 03/11/2023 Evening Active Child  montelukast (SINGULAIR) 10 MG tablet [id number] No Take 1 tablet (10 mg total) by mouth at bedtime. TAKE 1 TABLET(10 MG) BY MOUTH NIGHTLY Dorcas Carrow, DO 03/11/2023 Active Child  Multiple Vitamins-Minerals (GNP HEALTHY EYES SUPERVISION 2) CAPS [id number] No TAKE 1 CAPSULE BY MOUTH 2 TIMES DAILY Olevia Perches P, DO 03/11/2023 Active Child  NOVOLOG FLEXPEN 100 UNIT/ML FlexPen [id number] No Inject 4 Units into the skin 3 (three) times daily before meals. (plus up to 5u sliding scale correction) Dorcas Carrow, DO 03/11/2023 Active Child  sertraline (ZOLOFT) 50 MG tablet [id number] No Take 1.5 tablets (75 mg total) by mouth daily. Olevia Perches P, DO 03/11/2023 Active Child  Tiotropium Bromide Monohydrate 2.5 MCG/ACT AERS [id number] No Inhale 3 puffs into the lungs 2 (two) times daily as needed. [provider] Taking Active Child  TRESIBA FLEXTOUCH 100 UNIT/ML FlexTouch Pen [id number] No Inject into the skin at bedtime. [provider] Taking Active  Child           Med Note Katrinka Blazing, Alona Bene   Fri Mar 11, 2023 10:43 PM) Not started  TRUE METRIX BLOOD GLUCOSE TEST test strip [id number] No TEST BLOOD SUGAR 4 TIMES DAILY BEFORE LEVEMIR IS GIVEN OR AS DIRECTED BY PHYSICIAN Dorcas Carrow, DO Taking Active Child  TRUEplus Safety Lancets 28G MISC [id number] No USE FOR FINGER STICK BLOOD SUGARS (CURRENTLY 4 TIMES DAILY) Johnson, Megan P, DO Taking Active Child  ULTRACARE PEN NEEDLES 33G X 4 MM MISC [id number] No USE WITH INSULIN PENS AS DIRECTED BY PHYSICIAN (CURRENTLY 4 TIMES DAILY) Dorcas Carrow, DO Taking Active Child            Home Care and Equipment/Supplies: Were Home Health Services Ordered?: NA Any new equipment or medical supplies ordered?: NA  Functional Questionnaire: Do you need assistance with bathing/showering or dressing?: Yes Do you need assistance with meal preparation?: No Do you have difficulty maintaining continence: Yes Do you need assistance with getting out of bed/getting out of a chair/moving?: No Do you have difficulty managing or taking your medications?: Yes  Follow up appointments reviewed: PCP Follow-up appointment confirmed?: Yes Date of PCP follow-up appointment?: 03/18/23 Follow-up Provider: [facility] Follow-up appointment confirmed?: Yes Date of Specialist follow-up appointment?: 03/22/23 Follow-Up Specialty Provider:: Endo Do you need transportation to your follow-up appointment?: No Do you understand care options if your condition(s) worsen?: Yes-patient verbalized understanding    SIGNATURE Karena Addison, LPN [facility] Nurse Health Advisor Direct Dial [phone]

## 2023-03-14 NOTE — Patient Instructions (Signed)

## 2023-03-14 NOTE — Telephone Encounter (Signed)
 Noted.

## 2023-03-18 ENCOUNTER — Encounter: Payer: Self-pay | Admitting: Nurse Practitioner

## 2023-03-18 ENCOUNTER — Ambulatory Visit (INDEPENDENT_AMBULATORY_CARE_PROVIDER_SITE_OTHER): Admitting: Nurse Practitioner

## 2023-03-18 VITALS — BP 150/86 | HR 86 | Temp 97.7°F | Ht 59.0 in | Wt 99.8 lb

## 2023-03-18 DIAGNOSIS — I129 Hypertensive chronic kidney disease with stage 1 through stage 4 chronic kidney disease, or unspecified chronic kidney disease: Secondary | ICD-10-CM

## 2023-03-18 MED ORDER — HYDRALAZINE HCL 10 MG PO TABS: 10.0000 mg | ORAL_TABLET | Freq: Two times a day (BID) | ORAL | 2 refills | Status: DC | PRN

## 2023-03-18 MED ORDER — HYDROCHLOROTHIAZIDE 25 MG PO TABS
25.0000 mg | ORAL_TABLET | Freq: Every day | ORAL | 2 refills | Status: DC
Start: 2023-03-18 — End: 2023-05-14

## 2023-03-18 NOTE — Progress Notes (Signed)
 BP (!) 150/86 (BP Location: Left Arm, Patient Position: Sitting, Cuff Size: Normal)   Pulse 86   Temp 97.7 F (36.5 C) (Oral)   Ht 4\' 11"  (1.499 m)   Wt 99 lb 12.8 oz (45.3 kg)   SpO2 98%   BMI 20.16 kg/m    Subjective:    Patient ID: Angela Neal, female    DOB: 01-30-1921, 89 y.o.   MRN: [id number]  HPI: Angela Neal is a 89 y.o. female  Chief Complaint  Patient presents with   ER Follow Up   Hypertension   Daughter at bedside to assist with HPI.  ER FOLLOW UP Seen in ER on 03/11/23 for elevations in BP + low blood sugar with altered mental status.  They gave her one dose of Lisinopril.  Sugar was in 50 to 60 in ER initially, they fluctuate at home to per her daughter.  CT head overall reassuring, showed a remote left frontal infarct.  Currently takes Levemir and Novolog for diabetes, sees endocrinology. Cannot wear Libre or Dexcom -- they would not register correctly.  Since discharge BP has been 115/64 to 205/98.  Her daughter monitors BP and sugars consistently at home.  Sugars also fluctuate 50 to 600.    For HTN takes Losartan and HCTZ.  Took Amlodipine in past, but this caused severe swelling. Time since discharge: 03/12/23 was discharged Hospital/facility: [facility] Diagnosis: Altered mental status and hypertensive urgency Procedures/tests: CT scan and blood work Consultants: none New medications: none Discharge instructions:  follow-up with PCP. Status: stable   Relevant past medical, surgical, family and social history reviewed and updated as indicated. Interim medical history since our last visit reviewed. Allergies and medications reviewed and updated.  Review of Systems  Constitutional:  Negative for activity change, appetite change, diaphoresis, fatigue and fever.  Respiratory:  Negative for cough, chest tightness and shortness of breath.   Cardiovascular:  Negative for chest pain, palpitations and leg swelling.  Gastrointestinal: Negative.   Endocrine:  Negative for polydipsia, polyphagia and polyuria.  Neurological: Negative.   Psychiatric/Behavioral: Negative.      Per HPI unless specifically indicated above     Objective:    BP (!) 150/86 (BP Location: Left Arm, Patient Position: Sitting, Cuff Size: Normal)   Pulse 86   Temp 97.7 F (36.5 C) (Oral)   Ht 4\' 11"  (1.499 m)   Wt 99 lb 12.8 oz (45.3 kg)   SpO2 98%   BMI 20.16 kg/m   Wt Readings from Last 3 Encounters:  03/18/23 99 lb 12.8 oz (45.3 kg)  02/04/23 101 lb 12.8 oz (46.2 kg)  01/25/23 100 lb (45.4 kg)    Physical Exam Vitals and nursing note reviewed.  Constitutional:      General: She is awake. She is not in acute distress.    Appearance: She is well-developed and well-groomed. She is not ill-appearing or toxic-appearing.  HENT:     Head: Normocephalic.     Right Ear: Hearing and external ear normal.     Left Ear: Hearing and external ear normal.  Eyes:     General: Lids are normal.        Right eye: No discharge.        Left eye: No discharge.     Conjunctiva/sclera: Conjunctivae normal.     Pupils: Pupils are equal, round, and reactive to light.  Neck:     Thyroid: No thyromegaly.     Vascular: No carotid bruit.  Cardiovascular:  Rate and Rhythm: Normal rate and regular rhythm.     Heart sounds: Normal heart sounds. No murmur heard.    No gallop.  Pulmonary:     Effort: Pulmonary effort is normal. No accessory muscle usage or respiratory distress.     Breath sounds: Normal breath sounds.  Abdominal:     General: Bowel sounds are normal. There is no distension.     Palpations: Abdomen is soft.     Tenderness: There is no abdominal tenderness.  Musculoskeletal:     Cervical back: Normal range of motion and neck supple.     Right lower leg: No edema.     Left lower leg: No edema.  Lymphadenopathy:     Cervical: No cervical adenopathy.  Skin:    General: Skin is warm and dry.  Neurological:     Mental Status: She is alert.     Deep Tendon  Reflexes: Reflexes are normal and symmetric.     Reflex Scores:      Brachioradialis reflexes are 2+ on the right side and 2+ on the left side.      Patellar reflexes are 2+ on the right side and 2+ on the left side.    Comments: Very pleasant and quiet. Alert.  Psychiatric:        Attention and Perception: Attention normal.        Mood and Affect: Mood normal.        Speech: Speech normal.        Behavior: Behavior normal. Behavior is cooperative.        Thought Content: Thought content normal.    Results for orders placed or performed during the hospital encounter of 03/11/23  Urinalysis, Routine w reflex microscopic -Urine, Clean Catch   Collection Time: 03/11/23  8:25 PM  Result Value Ref Range   Color, Urine STRAW (A) YELLOW   APPearance CLEAR (A) CLEAR   Specific Gravity, Urine 1.010 1.005 - 1.030   pH 5.0 5.0 - 8.0   Glucose, UA NEGATIVE NEGATIVE mg/dL   Hgb urine dipstick NEGATIVE NEGATIVE   Bilirubin Urine NEGATIVE NEGATIVE   Ketones, ur NEGATIVE NEGATIVE mg/dL   Protein, ur NEGATIVE NEGATIVE mg/dL   Nitrite NEGATIVE NEGATIVE   Leukocytes,Ua NEGATIVE NEGATIVE  Basic metabolic panel   Collection Time: 03/11/23  8:28 PM  Result Value Ref Range   Sodium 135 135 - 145 mmol/L   Potassium 3.6 3.5 - 5.1 mmol/L   Chloride 97 (L) 98 - 111 mmol/L   CO2 25 22 - 32 mmol/L   Glucose, Bld 57 (L) 70 - 99 mg/dL   BUN 29 (H) 8 - 23 mg/dL   Creatinine, Ser 9.14 (H) 0.44 - 1.00 mg/dL   Calcium 9.5 8.9 - 78.2 mg/dL   GFR, Estimated 40 (L) >60 mL/min   Anion gap 13 5 - 15  CBC   Collection Time: 03/11/23  8:28 PM  Result Value Ref Range   WBC 12.1 (H) 4.0 - 10.5 K/uL   RBC 4.33 3.87 - 5.11 MIL/uL   Hemoglobin 13.8 12.0 - 15.0 g/dL   HCT 95.6 21.3 - 08.6 %   MCV 95.6 80.0 - 100.0 fL   MCH 31.9 26.0 - 34.0 pg   MCHC 33.3 30.0 - 36.0 g/dL   RDW 57.8 46.9 - 62.9 %   Platelets 172 150 - 400 K/uL   nRBC 0.0 0.0 - 0.2 %  Hemoglobin A1c   Collection Time: 03/11/23  8:28 PM  Result  Value Ref Range   Hgb A1c MFr Bld 6.4 (H) 4.8 - 5.6 %   Mean Plasma Glucose 136.98 mg/dL  Troponin I (High Sensitivity)   Collection Time: 03/11/23  8:28 PM  Result Value Ref Range   Troponin I (High Sensitivity) 20 (H) <18 ng/L  CBG monitoring, ED   Collection Time: 03/11/23  8:32 PM  Result Value Ref Range   Glucose-Capillary 51 (L) 70 - 99 mg/dL  CBG monitoring, ED   Collection Time: 03/11/23  9:48 PM  Result Value Ref Range   Glucose-Capillary 101 (H) 70 - 99 mg/dL  Troponin I (High Sensitivity)   Collection Time: 03/11/23 10:44 PM  Result Value Ref Range   Troponin I (High Sensitivity) 23 (H) <18 ng/L  POC CBG, ED   Collection Time: 03/11/23 11:51 PM  Result Value Ref Range   Glucose-Capillary 115 (H) 70 - 99 mg/dL  POC CBG, ED   Collection Time: 03/12/23  3:37 AM  Result Value Ref Range   Glucose-Capillary 186 (H) 70 - 99 mg/dL  POC CBG, ED   Collection Time: 03/12/23  4:54 AM  Result Value Ref Range   Glucose-Capillary 186 (H) 70 - 99 mg/dL  Basic metabolic panel   Collection Time: 03/12/23  4:56 AM  Result Value Ref Range   Sodium 136 135 - 145 mmol/L   Potassium 3.7 3.5 - 5.1 mmol/L   Chloride 99 98 - 111 mmol/L   CO2 22 22 - 32 mmol/L   Glucose, Bld 208 (H) 70 - 99 mg/dL   BUN 23 8 - 23 mg/dL   Creatinine, Ser 1.61 0.44 - 1.00 mg/dL   Calcium 8.8 (L) 8.9 - 10.3 mg/dL   GFR, Estimated 59 (L) >60 mL/min   Anion gap 15 5 - 15  CBC   Collection Time: 03/12/23  4:56 AM  Result Value Ref Range   WBC 9.5 4.0 - 10.5 K/uL   RBC 3.99 3.87 - 5.11 MIL/uL   Hemoglobin 12.6 12.0 - 15.0 g/dL   HCT 09.6 04.5 - 40.9 %   MCV 94.7 80.0 - 100.0 fL   MCH 31.6 26.0 - 34.0 pg   MCHC 33.3 30.0 - 36.0 g/dL   RDW 81.1 91.4 - 78.2 %   Platelets 133 (L) 150 - 400 K/uL   nRBC 0.0 0.0 - 0.2 %  POC CBG, ED   Collection Time: 03/12/23  7:29 AM  Result Value Ref Range   Glucose-Capillary 187 (H) 70 - 99 mg/dL  CBG monitoring, ED   Collection Time: 03/12/23 11:26 AM  Result  Value Ref Range   Glucose-Capillary 209 (H) 70 - 99 mg/dL  CBG monitoring, ED   Collection Time: 03/12/23  1:35 PM  Result Value Ref Range   Glucose-Capillary 220 (H) 70 - 99 mg/dL      Assessment & Plan:   Problem List Items Addressed This Visit       Genitourinary   Benign hypertensive renal disease - Primary   Chronic with elevations in BP.  Will maintain Losartan at max dose.  Increase HCTZ to 25 MG, daughter would like to try this as that dose works for her.  Discussed with her that HCTZ can affect insulin when taken, to continue to monitor sugars consistently.  Add on Hydralazine 10 MG BID PRN to take for BP >140/90, may need to add on for daily use.  Educated her on medication changes and side effects to monitor for.  Goal for her age is  BP <140/90 and avoid hypotension.  Would avoid BB due to her blood sugar inconsistencies.  Recommend she monitor BP at least a few mornings a week at home and document.  DASH diet at home.  Labs today: up to date.  Return to see PCP as scheduled on the 19th.         Follow up plan: Return for Follow up as scheduled on the 19th with Dr. Laural Benes.

## 2023-03-18 NOTE — Assessment & Plan Note (Signed)
 Chronic with elevations in BP.  Will maintain Losartan at max dose.  Increase HCTZ to 25 MG, daughter would like to try this as that dose works for her.  Discussed with her that HCTZ can affect insulin when taken, to continue to monitor sugars consistently.  Add on Hydralazine 10 MG BID PRN to take for BP >140/90, may need to add on for daily use.  Educated her on medication changes and side effects to monitor for.  Goal for her age is BP <140/90 and avoid hypotension.  Would avoid BB due to her blood sugar inconsistencies.  Recommend she monitor BP at least a few mornings a week at home and document.  DASH diet at home.  Labs today: up to date.  Return to see PCP as scheduled on the 19th.

## 2023-03-22 DIAGNOSIS — Z794 Long term (current) use of insulin: Secondary | ICD-10-CM | POA: Diagnosis not present

## 2023-03-22 DIAGNOSIS — M810 Age-related osteoporosis without current pathological fracture: Secondary | ICD-10-CM | POA: Diagnosis not present

## 2023-03-22 DIAGNOSIS — R809 Proteinuria, unspecified: Secondary | ICD-10-CM | POA: Diagnosis not present

## 2023-03-22 DIAGNOSIS — N1831 Chronic kidney disease, stage 3a: Secondary | ICD-10-CM | POA: Diagnosis not present

## 2023-03-22 DIAGNOSIS — E1129 Type 2 diabetes mellitus with other diabetic kidney complication: Secondary | ICD-10-CM | POA: Diagnosis not present

## 2023-03-22 DIAGNOSIS — E1122 Type 2 diabetes mellitus with diabetic chronic kidney disease: Secondary | ICD-10-CM | POA: Diagnosis not present

## 2023-03-22 DIAGNOSIS — E113313 Type 2 diabetes mellitus with moderate nonproliferative diabetic retinopathy with macular edema, bilateral: Secondary | ICD-10-CM | POA: Diagnosis not present

## 2023-03-28 ENCOUNTER — Encounter: Payer: Self-pay | Admitting: Family Medicine

## 2023-03-28 ENCOUNTER — Ambulatory Visit (INDEPENDENT_AMBULATORY_CARE_PROVIDER_SITE_OTHER): Admitting: Family Medicine

## 2023-03-28 VITALS — BP 176/80 | HR 85 | Temp 97.6°F | Wt 106.0 lb

## 2023-03-28 DIAGNOSIS — R410 Disorientation, unspecified: Secondary | ICD-10-CM | POA: Diagnosis not present

## 2023-03-28 DIAGNOSIS — I129 Hypertensive chronic kidney disease with stage 1 through stage 4 chronic kidney disease, or unspecified chronic kidney disease: Secondary | ICD-10-CM | POA: Diagnosis not present

## 2023-03-28 MED ORDER — HYDRALAZINE HCL 10 MG PO TABS: 20.0000 mg | ORAL_TABLET | Freq: Two times a day (BID) | ORAL | Status: DC

## 2023-03-28 NOTE — Progress Notes (Signed)
 BP (!) 176/80   Pulse 85   Temp 97.6 F (36.4 C) (Oral)   Wt 106 lb (48.1 kg)   SpO2 97%   BMI 21.41 kg/m    Subjective:    Patient ID: Angela Neal, female    DOB: 1921/04/07, 89 y.o.   MRN: [id number]  HPI: Angela Neal is a 89 y.o. female  Chief Complaint  Patient presents with   Hypertension   HYPERTENSION-  has needed to take her hydralazine every day, BP still running high Hypertension status: uncontrolled  Satisfied with current treatment? no Duration of hypertension: chronic BP monitoring frequency:   2x a day BP range: see scanned document- running in the 170s-200s-70s-100s BP medication side effects:  no Medication compliance: excellent compliance Previous BP meds:losartan, hydrochlorothiazide, hydralazine Aspirin: no Recurrent headaches: no Visual changes: no Palpitations: no Dyspnea: no Chest pain: no Lower extremity edema: no Dizzy/lightheaded: no  Relevant past medical, surgical, family and social history reviewed and updated as indicated. Interim medical history since our last visit reviewed. Allergies and medications reviewed and updated.  Review of Systems  Respiratory:  Positive for cough. Negative for apnea, choking, chest tightness, shortness of breath, wheezing and stridor.   Cardiovascular: Negative.   Gastrointestinal: Negative.   Musculoskeletal: Negative.   Neurological: Negative.   Psychiatric/Behavioral:  Positive for confusion. Negative for agitation, behavioral problems, decreased concentration, dysphoric mood, hallucinations, self-injury, sleep disturbance and suicidal ideas. The patient is not nervous/anxious and is not hyperactive.     Per HPI unless specifically indicated above     Objective:    BP (!) 176/80   Pulse 85   Temp 97.6 F (36.4 C) (Oral)   Wt 106 lb (48.1 kg)   SpO2 97%   BMI 21.41 kg/m   Wt Readings from Last 3 Encounters:  03/28/23 106 lb (48.1 kg)  03/18/23 99 lb 12.8 oz (45.3 kg)  02/04/23 101  lb 12.8 oz (46.2 kg)    Physical Exam Vitals and nursing note reviewed.  Constitutional:      General: She is not in acute distress.    Appearance: Normal appearance. She is not ill-appearing, toxic-appearing or diaphoretic.  HENT:     Head: Normocephalic and atraumatic.     Right Ear: External ear normal.     Left Ear: External ear normal.     Nose: Nose normal.     Mouth/Throat:     Mouth: Mucous membranes are moist.     Pharynx: Oropharynx is clear.  Eyes:     General: No scleral icterus.       Right eye: No discharge.        Left eye: No discharge.     Extraocular Movements: Extraocular movements intact.     Conjunctiva/sclera: Conjunctivae normal.     Pupils: Pupils are equal, round, and reactive to light.  Cardiovascular:     Rate and Rhythm: Normal rate and regular rhythm.     Pulses: Normal pulses.     Heart sounds: Murmur heard.     No friction rub. No gallop.  Pulmonary:     Effort: Pulmonary effort is normal. No respiratory distress.     Breath sounds: Normal breath sounds. No stridor. No wheezing, rhonchi or rales.  Chest:     Chest wall: No tenderness.  Musculoskeletal:        General: Normal range of motion.     Cervical back: Normal range of motion and neck supple.  Skin:  General: Skin is warm and dry.     Capillary Refill: Capillary refill takes less than 2 seconds.     Coloration: Skin is not jaundiced or pale.     Findings: No bruising, erythema, lesion or rash.  Neurological:     General: No focal deficit present.     Mental Status: She is alert and oriented to person, place, and time. Mental status is at baseline.  Psychiatric:        Mood and Affect: Mood normal.        Behavior: Behavior normal.        Thought Content: Thought content normal.        Judgment: Judgment normal.     Results for orders placed or performed during the hospital encounter of 03/11/23  Urinalysis, Routine w reflex microscopic -Urine, Clean Catch   Collection Time:  03/11/23  8:25 PM  Result Value Ref Range   Color, Urine STRAW (A) YELLOW   APPearance CLEAR (A) CLEAR   Specific Gravity, Urine 1.010 1.005 - 1.030   pH 5.0 5.0 - 8.0   Glucose, UA NEGATIVE NEGATIVE mg/dL   Hgb urine dipstick NEGATIVE NEGATIVE   Bilirubin Urine NEGATIVE NEGATIVE   Ketones, ur NEGATIVE NEGATIVE mg/dL   Protein, ur NEGATIVE NEGATIVE mg/dL   Nitrite NEGATIVE NEGATIVE   Leukocytes,Ua NEGATIVE NEGATIVE  Basic metabolic panel   Collection Time: 03/11/23  8:28 PM  Result Value Ref Range   Sodium 135 135 - 145 mmol/L   Potassium 3.6 3.5 - 5.1 mmol/L   Chloride 97 (L) 98 - 111 mmol/L   CO2 25 22 - 32 mmol/L   Glucose, Bld 57 (L) 70 - 99 mg/dL   BUN 29 (H) 8 - 23 mg/dL   Creatinine, Ser 0.98 (H) 0.44 - 1.00 mg/dL   Calcium 9.5 8.9 - 11.9 mg/dL   GFR, Estimated 40 (L) >60 mL/min   Anion gap 13 5 - 15  CBC   Collection Time: 03/11/23  8:28 PM  Result Value Ref Range   WBC 12.1 (H) 4.0 - 10.5 K/uL   RBC 4.33 3.87 - 5.11 MIL/uL   Hemoglobin 13.8 12.0 - 15.0 g/dL   HCT 14.7 82.9 - 56.2 %   MCV 95.6 80.0 - 100.0 fL   MCH 31.9 26.0 - 34.0 pg   MCHC 33.3 30.0 - 36.0 g/dL   RDW 13.0 86.5 - 78.4 %   Platelets 172 150 - 400 K/uL   nRBC 0.0 0.0 - 0.2 %  Hemoglobin A1c   Collection Time: 03/11/23  8:28 PM  Result Value Ref Range   Hgb A1c MFr Bld 6.4 (H) 4.8 - 5.6 %   Mean Plasma Glucose 136.98 mg/dL  Troponin I (High Sensitivity)   Collection Time: 03/11/23  8:28 PM  Result Value Ref Range   Troponin I (High Sensitivity) 20 (H) <18 ng/L  CBG monitoring, ED   Collection Time: 03/11/23  8:32 PM  Result Value Ref Range   Glucose-Capillary 51 (L) 70 - 99 mg/dL  CBG monitoring, ED   Collection Time: 03/11/23  9:48 PM  Result Value Ref Range   Glucose-Capillary 101 (H) 70 - 99 mg/dL  Troponin I (High Sensitivity)   Collection Time: 03/11/23 10:44 PM  Result Value Ref Range   Troponin I (High Sensitivity) 23 (H) <18 ng/L  POC CBG, ED   Collection Time: 03/11/23 11:51  PM  Result Value Ref Range   Glucose-Capillary 115 (H) 70 - 99 mg/dL  POC  CBG, ED   Collection Time: 03/12/23  3:37 AM  Result Value Ref Range   Glucose-Capillary 186 (H) 70 - 99 mg/dL  POC CBG, ED   Collection Time: 03/12/23  4:54 AM  Result Value Ref Range   Glucose-Capillary 186 (H) 70 - 99 mg/dL  Basic metabolic panel   Collection Time: 03/12/23  4:56 AM  Result Value Ref Range   Sodium 136 135 - 145 mmol/L   Potassium 3.7 3.5 - 5.1 mmol/L   Chloride 99 98 - 111 mmol/L   CO2 22 22 - 32 mmol/L   Glucose, Bld 208 (H) 70 - 99 mg/dL   BUN 23 8 - 23 mg/dL   Creatinine, Ser 4.09 0.44 - 1.00 mg/dL   Calcium 8.8 (L) 8.9 - 10.3 mg/dL   GFR, Estimated 59 (L) >60 mL/min   Anion gap 15 5 - 15  CBC   Collection Time: 03/12/23  4:56 AM  Result Value Ref Range   WBC 9.5 4.0 - 10.5 K/uL   RBC 3.99 3.87 - 5.11 MIL/uL   Hemoglobin 12.6 12.0 - 15.0 g/dL   HCT 81.1 91.4 - 78.2 %   MCV 94.7 80.0 - 100.0 fL   MCH 31.6 26.0 - 34.0 pg   MCHC 33.3 30.0 - 36.0 g/dL   RDW 95.6 21.3 - 08.6 %   Platelets 133 (L) 150 - 400 K/uL   nRBC 0.0 0.0 - 0.2 %  POC CBG, ED   Collection Time: 03/12/23  7:29 AM  Result Value Ref Range   Glucose-Capillary 187 (H) 70 - 99 mg/dL  CBG monitoring, ED   Collection Time: 03/12/23 11:26 AM  Result Value Ref Range   Glucose-Capillary 209 (H) 70 - 99 mg/dL  CBG monitoring, ED   Collection Time: 03/12/23  1:35 PM  Result Value Ref Range   Glucose-Capillary 220 (H) 70 - 99 mg/dL      Assessment & Plan:   Problem List Items Addressed This Visit       Genitourinary   Benign hypertensive renal disease - Primary   Still running high. Will increase her hydralazine to 20mg  BID and recheck in 2 weeks. Call with any concerns. Continue to monitor.       Other Visit Diagnoses       Confusion       Will check UA. Await results. Treat as needed.   Relevant Orders   Urinalysis, Routine w reflex microscopic   Urine Culture        Follow up plan: Return in  about 2 weeks (around 04/11/2023).

## 2023-03-28 NOTE — Assessment & Plan Note (Signed)
 Still running high. Will increase her hydralazine to 20mg  BID and recheck in 2 weeks. Call with any concerns. Continue to monitor.

## 2023-03-30 DIAGNOSIS — E1122 Type 2 diabetes mellitus with diabetic chronic kidney disease: Secondary | ICD-10-CM | POA: Diagnosis not present

## 2023-04-04 DIAGNOSIS — R410 Disorientation, unspecified: Secondary | ICD-10-CM | POA: Diagnosis not present

## 2023-04-04 LAB — URINALYSIS, ROUTINE W REFLEX MICROSCOPIC
Bilirubin, UA: NEGATIVE
Glucose, UA: NEGATIVE
Ketones, UA: NEGATIVE
Leukocytes,UA: NEGATIVE
Nitrite, UA: NEGATIVE
Protein,UA: NEGATIVE
RBC, UA: NEGATIVE
Specific Gravity, UA: 1.01 (ref 1.005–1.030)
Urobilinogen, Ur: 0.2 mg/dL (ref 0.2–1.0)
pH, UA: 6 (ref 5.0–7.5)

## 2023-04-06 LAB — URINE CULTURE

## 2023-04-29 ENCOUNTER — Ambulatory Visit (INDEPENDENT_AMBULATORY_CARE_PROVIDER_SITE_OTHER): Admitting: Family Medicine

## 2023-04-29 ENCOUNTER — Encounter: Payer: Self-pay | Admitting: Family Medicine

## 2023-04-29 VITALS — BP 132/80 | HR 98 | Ht 59.0 in | Wt 103.5 lb

## 2023-04-29 DIAGNOSIS — Z794 Long term (current) use of insulin: Secondary | ICD-10-CM | POA: Diagnosis not present

## 2023-04-29 DIAGNOSIS — J449 Chronic obstructive pulmonary disease, unspecified: Secondary | ICD-10-CM | POA: Diagnosis not present

## 2023-04-29 DIAGNOSIS — F339 Major depressive disorder, recurrent, unspecified: Secondary | ICD-10-CM

## 2023-04-29 DIAGNOSIS — F0390 Unspecified dementia without behavioral disturbance: Secondary | ICD-10-CM

## 2023-04-29 DIAGNOSIS — R531 Weakness: Secondary | ICD-10-CM | POA: Diagnosis not present

## 2023-04-29 DIAGNOSIS — E1169 Type 2 diabetes mellitus with other specified complication: Secondary | ICD-10-CM

## 2023-04-29 DIAGNOSIS — I129 Hypertensive chronic kidney disease with stage 1 through stage 4 chronic kidney disease, or unspecified chronic kidney disease: Secondary | ICD-10-CM

## 2023-04-29 DIAGNOSIS — E1122 Type 2 diabetes mellitus with diabetic chronic kidney disease: Secondary | ICD-10-CM | POA: Diagnosis not present

## 2023-04-29 DIAGNOSIS — E785 Hyperlipidemia, unspecified: Secondary | ICD-10-CM

## 2023-04-29 DIAGNOSIS — N183 Chronic kidney disease, stage 3 unspecified: Secondary | ICD-10-CM | POA: Diagnosis not present

## 2023-04-29 MED ORDER — SPACER/AERO-HOLDING CHAMBERS DEVI: 0 refills | Status: AC

## 2023-04-29 MED ORDER — ALBUTEROL SULFATE HFA 108 (90 BASE) MCG/ACT IN AERS: 2.0000 | INHALATION_SPRAY | Freq: Four times a day (QID) | RESPIRATORY_TRACT | 0 refills | Status: DC | PRN

## 2023-04-29 MED ORDER — MIRTAZAPINE 45 MG PO TABS: 45.0000 mg | ORAL_TABLET | Freq: Every day | ORAL | 0 refills | Status: DC

## 2023-04-29 MED ORDER — HYDRALAZINE HCL 10 MG PO TABS: 30.0000 mg | ORAL_TABLET | Freq: Two times a day (BID) | ORAL | 1 refills | Status: DC

## 2023-04-29 NOTE — Assessment & Plan Note (Signed)
 Having issues with sleep. Will increase her mirtazapine  to 45mg  and recheck in 3 weeks. Call with any concerns.

## 2023-04-29 NOTE — Progress Notes (Signed)
 BP 132/80 (BP Location: Left Arm, Patient Position: Sitting, Cuff Size: Small)   Pulse 98   Ht 4\' 11"  (1.499 m)   Wt 103 lb 8 oz (46.9 kg)   SpO2 97%   BMI 20.90 kg/m    Subjective:    Patient ID: Angela Neal, female    DOB: 05-Jul-1921, 89 y.o.   MRN: [id number]  HPI: Angela Neal is a 89 y.o. female  Chief Complaint  Patient presents with   Hypertension   HYPERTENSION  Hypertension status: uncontrolled  Satisfied with current treatment? no Duration of hypertension: chronic BP monitoring frequency:  daily BP range: 170s-200s (see scanned document) BP medication side effects:  no Medication compliance: excellent compliance Aspirin: no Recurrent headaches: no Visual changes: no Palpitations: no Dyspnea: no Chest pain: no Lower extremity edema: no Dizzy/lightheaded: no  INSOMNIA Duration: chronic Satisfied with sleep quality: no Difficulty falling asleep: yes Difficulty staying asleep: no Waking a few hours after sleep onset: no Early morning awakenings: yes Daytime hypersomnolence: yes Wakes feeling refreshed: no Good sleep hygiene: yes Apnea: no Snoring: no Depressed/anxious mood: yes Recent stress: no Restless legs/nocturnal leg cramps: no Chronic pain/arthritis: no History of sleep study: no Treatments attempted: melatonin   Has been having issues with using her walker, has been feeling weaker. Daughter would like to get home health out to help make her stronger.   Relevant past medical, surgical, family and social history reviewed and updated as indicated. Interim medical history since our last visit reviewed. Allergies and medications reviewed and updated.  Review of Systems  Constitutional: Negative.   Respiratory: Negative.    Cardiovascular: Negative.   Musculoskeletal: Negative.   Neurological: Negative.   Psychiatric/Behavioral:  Positive for sleep disturbance. Negative for agitation, behavioral problems, confusion, decreased  concentration, dysphoric mood, hallucinations, self-injury and suicidal ideas. The patient is not nervous/anxious and is not hyperactive.     Per HPI unless specifically indicated above     Objective:    BP 132/80 (BP Location: Left Arm, Patient Position: Sitting, Cuff Size: Small)   Pulse 98   Ht 4\' 11"  (1.499 m)   Wt 103 lb 8 oz (46.9 kg)   SpO2 97%   BMI 20.90 kg/m   Wt Readings from Last 3 Encounters:  04/29/23 103 lb 8 oz (46.9 kg)  03/28/23 106 lb (48.1 kg)  03/18/23 99 lb 12.8 oz (45.3 kg)    Physical Exam Vitals and nursing note reviewed.  Constitutional:      General: She is not in acute distress.    Appearance: Normal appearance. She is not ill-appearing, toxic-appearing or diaphoretic.  HENT:     Head: Normocephalic and atraumatic.     Right Ear: External ear normal.     Left Ear: External ear normal.     Nose: Nose normal.     Mouth/Throat:     Mouth: Mucous membranes are moist.     Pharynx: Oropharynx is clear.  Eyes:     General: No scleral icterus.       Right eye: No discharge.        Left eye: No discharge.     Extraocular Movements: Extraocular movements intact.     Conjunctiva/sclera: Conjunctivae normal.     Pupils: Pupils are equal, round, and reactive to light.  Cardiovascular:     Rate and Rhythm: Normal rate and regular rhythm.     Pulses: Normal pulses.     Heart sounds: Murmur heard.  No friction rub. No gallop.  Pulmonary:     Effort: Pulmonary effort is normal. No respiratory distress.     Breath sounds: Normal breath sounds. No stridor. No wheezing, rhonchi or rales.  Chest:     Chest wall: No tenderness.  Musculoskeletal:        General: Normal range of motion.     Cervical back: Normal range of motion and neck supple.  Skin:    General: Skin is warm and dry.     Capillary Refill: Capillary refill takes less than 2 seconds.     Coloration: Skin is not jaundiced or pale.     Findings: No bruising, erythema, lesion or rash.   Neurological:     General: No focal deficit present.     Mental Status: She is alert and oriented to person, place, and time. Mental status is at baseline.  Psychiatric:        Mood and Affect: Mood normal.        Behavior: Behavior normal.        Thought Content: Thought content normal.        Judgment: Judgment normal.     Results for orders placed or performed in visit on 03/28/23  Urinalysis, Routine w reflex microscopic   Collection Time: 04/04/23  9:30 AM  Result Value Ref Range   Specific Gravity, UA 1.010 1.005 - 1.030   pH, UA 6.0 5.0 - 7.5   Color, UA Yellow Yellow   Appearance Ur Clear Clear   Leukocytes,UA Negative Negative   Protein,UA Negative Negative/Trace   Glucose, UA Negative Negative   Ketones, UA Negative Negative   RBC, UA Negative Negative   Bilirubin, UA Negative Negative   Urobilinogen, Ur 0.2 0.2 - 1.0 mg/dL   Nitrite, UA Negative Negative   Microscopic Examination Comment   Urine Culture   Collection Time: 04/04/23 10:20 AM   Specimen: Urine   UR  Result Value Ref Range   Urine Culture, Routine Final report    Organism ID, Bacteria Comment       Assessment & Plan:   Problem List Items Addressed This Visit       Respiratory   COPD (chronic obstructive pulmonary disease) (HCC)   Relevant Medications   albuterol  (VENTOLIN  HFA) 108 (90 Base) MCG/ACT inhaler   Other Relevant Orders   Ambulatory referral to Home Health     Endocrine   Type 2 diabetes mellitus with renal complication (HCC)   Relevant Orders   Ambulatory referral to Home Health   Hyperlipidemia associated with type 2 diabetes mellitus (HCC)   Relevant Medications   hydrALAZINE  (APRESOLINE ) 10 MG tablet   Other Relevant Orders   Ambulatory referral to Home Health     Nervous and Auditory   Chronic dementia without behavioral disturbance (HCC)   Having issues with sleep. Will increase her mirtazapine  to 45mg  and recheck in 3 weeks. Call with any concerns.       Relevant Medications   mirtazapine  (REMERON ) 45 MG tablet   Other Relevant Orders   Ambulatory referral to Home Health     Genitourinary   Benign hypertensive renal disease - Primary   Relevant Orders   Ambulatory referral to Home Health     Other   Depression, recurrent (HCC)   Having issues with sleep. Will increase her mirtazapine  to 45mg  and recheck in 3 weeks. Call with any concerns.      Relevant Medications   mirtazapine  (REMERON ) 45 MG tablet  Other Relevant Orders   Ambulatory referral to Home Health   Other Visit Diagnoses       Weakness       Would benefit from physical therapy. Will get her home health. Call with any concerns.        Follow up plan: Return in about 3 weeks (around 05/20/2023) for ok to use same day.

## 2023-04-30 DIAGNOSIS — E1122 Type 2 diabetes mellitus with diabetic chronic kidney disease: Secondary | ICD-10-CM | POA: Diagnosis not present

## 2023-05-04 DIAGNOSIS — F0393 Unspecified dementia, unspecified severity, with mood disturbance: Secondary | ICD-10-CM | POA: Diagnosis not present

## 2023-05-04 DIAGNOSIS — N183 Chronic kidney disease, stage 3 unspecified: Secondary | ICD-10-CM | POA: Diagnosis not present

## 2023-05-04 DIAGNOSIS — J449 Chronic obstructive pulmonary disease, unspecified: Secondary | ICD-10-CM | POA: Diagnosis not present

## 2023-05-04 DIAGNOSIS — E1169 Type 2 diabetes mellitus with other specified complication: Secondary | ICD-10-CM | POA: Diagnosis not present

## 2023-05-04 DIAGNOSIS — Z794 Long term (current) use of insulin: Secondary | ICD-10-CM | POA: Diagnosis not present

## 2023-05-04 DIAGNOSIS — F339 Major depressive disorder, recurrent, unspecified: Secondary | ICD-10-CM | POA: Diagnosis not present

## 2023-05-04 DIAGNOSIS — I129 Hypertensive chronic kidney disease with stage 1 through stage 4 chronic kidney disease, or unspecified chronic kidney disease: Secondary | ICD-10-CM | POA: Diagnosis not present

## 2023-05-04 DIAGNOSIS — E785 Hyperlipidemia, unspecified: Secondary | ICD-10-CM | POA: Diagnosis not present

## 2023-05-04 DIAGNOSIS — Z7984 Long term (current) use of oral hypoglycemic drugs: Secondary | ICD-10-CM | POA: Diagnosis not present

## 2023-05-04 DIAGNOSIS — E1122 Type 2 diabetes mellitus with diabetic chronic kidney disease: Secondary | ICD-10-CM | POA: Diagnosis not present

## 2023-05-04 DIAGNOSIS — Z9181 History of falling: Secondary | ICD-10-CM | POA: Diagnosis not present

## 2023-05-04 DIAGNOSIS — Z7983 Long term (current) use of bisphosphonates: Secondary | ICD-10-CM | POA: Diagnosis not present

## 2023-05-04 DIAGNOSIS — F5104 Psychophysiologic insomnia: Secondary | ICD-10-CM | POA: Diagnosis not present

## 2023-05-11 ENCOUNTER — Telehealth: Payer: Self-pay

## 2023-05-11 NOTE — Telephone Encounter (Signed)
 Copied from CRM [id number]. Topic: Clinical - Home Health Verbal Orders >> May 10, 2023  3:31 PM Felizardo Hotter wrote: Caller/Agency: [facility] per Geradine Kitten Number: [phone] secure line Service Requested: Physical Therapy Frequency: 2w2, 1w2,  Any new concerns about the patient? No

## 2023-05-13 ENCOUNTER — Other Ambulatory Visit: Payer: Self-pay | Admitting: Family Medicine

## 2023-05-14 ENCOUNTER — Other Ambulatory Visit: Payer: Self-pay | Admitting: Family Medicine

## 2023-05-16 NOTE — Telephone Encounter (Signed)
 Copied from CRM [id number]. Topic: Clinical - Medication Refill >> May 16, 2023  4:52 PM Tiffany S wrote: Medication: GNP VITAMIN D3 EXTRA STRENGTH 25 MCG (1000 UT) tablet [[id number]] LEVEMIR  FLEXTOUCH 100 UNIT/ML FlexTouch Pen [[id number]]  mirtazapine  (REMERON ) 45 MG tablet losartan  (COZAAR ) 100 MG tablet [ albuterol  (VENTOLIN  HFA) 108 (90 Base) MCG/ACT inhaler [[id number]] Multiple Vitamins-Minerals (GNP HEALTHY EYES SUPERVISION 2) CAPS [[id number]] GNP NATURAL FIBER 0.52 g capsule [[id number]]   Has the patient contacted their pharmacy? Yes (Agent: If no, request that the patient contact the pharmacy for the refill. If patient does not wish to contact the pharmacy document the reason why and proceed with request.) (Agent: If yes, when and what did the pharmacy advise?)  This is the patient's preferred pharmacy:   [facility], [place] - [address] [place] [address] Phone: [phone] Fax: [phone]  Is this the correct pharmacy for this prescription? Yes If no, delete pharmacy and type the correct one.   Has the prescription been filled recently? Yes  Is the patient out of the medication? Yes  Has the patient been seen for an appointment in the last year OR does the patient have an upcoming appointment? Yes  Can we respond through MyChart? No  Agent: Please be advised that Rx refills may take up to 3 business days. We ask that you follow-up with your pharmacy.

## 2023-05-16 NOTE — Telephone Encounter (Signed)
 Requested Prescriptions  Pending Prescriptions Disp Refills   insulin  aspart (NOVOLOG  FLEXPEN) 100 UNIT/ML FlexPen [Pharmacy Med Name: NOVOLOG  FLEXPEN 100 INSU] 30 mL 0    Sig: INJECT 10 UNITS SUBCUTANEOUSLY 3 TIMES PER DAY *NEW PRESCRIPTION REQUEST*     Endocrinology:  Diabetes - Insulins Failed - 05/16/2023  2:04 PM      Failed - Valid encounter within last 6 months    Recent Outpatient Visits           2 weeks ago Benign hypertensive renal disease   [facility] [place], Megan P, DO   1 month ago Benign hypertensive renal disease   [place] [facility] [place], Megan P, DO   1 month ago Benign hypertensive renal disease   [facility] [place], [place] T, NP              Passed - HBA1C is between 0 and 7.9 and within 180 days    Hemoglobin A1C  Date Value Ref Range Status  08/04/2020 7.1  Final   HB A1C (BAYER DCA - WAIVED)  Date Value Ref Range Status  11/17/2022 7.8 (H) 4.8 - 5.6 % Final    Comment:             Prediabetes: 5.7 - 6.4          Diabetes: >6.4          Glycemic control for adults with diabetes: <7.0    Hgb A1c MFr Bld  Date Value Ref Range Status  03/11/2023 6.4 (H) 4.8 - 5.6 % Final    Comment:    (NOTE) Pre diabetes:          5.7%-6.4%  Diabetes:              >6.4%  Glycemic control for   <7.0% adults with diabetes           montelukast  (SINGULAIR ) 10 MG tablet [Pharmacy Med Name: MONTELUKAST  10 MG TAB 10 Tablet] 90 tablet 0    Sig: TAKE 1 TABLET BY MOUTH NIGHTLY *NEW PRESCRIPTION REQUEST*     Pulmonology:  Leukotriene Inhibitors Failed - 05/16/2023  2:04 PM      Failed - Valid encounter within last 12 months    Recent Outpatient Visits           2 weeks ago Benign hypertensive renal disease   [place] [facility] [place], Megan P, DO   1 month ago Benign hypertensive renal disease   [place] [facility] [place], Megan P, DO   1 month  ago Benign hypertensive renal disease   [place] [facility] [place], [place] T, NP              Refused Prescriptions Disp Refills   mirtazapine  (REMERON ) 45 MG tablet [Pharmacy Med Name: MIRTAZAPINE  45 MG TABLET 45 Tablet] 90 tablet 11    Sig: TAKE 1 TABLET(S) BY MOUTH 1 TIMES PER DAY AT BEDTIME *NEW PRESCRIPTION REQUEST*     Psychiatry: Antidepressants - mirtazapine  Failed - 05/16/2023  2:04 PM      Failed - Valid encounter within last 6 months    Recent Outpatient Visits           2 weeks ago Benign hypertensive renal disease   [place] [facility] [place], Megan P, DO   1 month ago Benign hypertensive renal disease   [place] [facility] [place], [place], DO  1 month ago Benign hypertensive renal disease   [place] [facility] [place], [place] T, NP              Passed - Completed PHQ-2 or PHQ-9 in the last 360 days       losartan  (COZAAR ) 100 MG tablet [Pharmacy Med Name: LOSARTAN  100MG  TAB 100 Tablet] 90 tablet 11    Sig: TAKE 1 TABLET BY MOUTH EVERY DAY *NEW PRESCRIPTION REQUEST*     Cardiovascular:  Angiotensin Receptor Blockers Failed - 05/16/2023  2:04 PM      Failed - Valid encounter within last 6 months    Recent Outpatient Visits           2 weeks ago Benign hypertensive renal disease   [place] [facility] [place], Megan P, DO   1 month ago Benign hypertensive renal disease   [place] [facility] [place], Megan P, DO   1 month ago Benign hypertensive renal disease   [place] [facility] [place], Sanjuan [place]umb[place] T, NP              Passed - Cr in normal range and within 180 days    Creatinine  Date Value Ref Range Status  04/26/2014 0.82 mg/dL Final    Comment:    1.61-0.96 NOTE: New Reference Range  03/12/14    Creatinine, Ser  Date Value Ref Range Status  03/12/2023 0.88 0.44 - 1.00 mg/dL Final         Passed - K in normal range and  within 180 days    Potassium  Date Value Ref Range Status  03/12/2023 3.7 3.5 - 5.1 mmol/L Final  04/26/2014 3.8 mmol/L Final    Comment:    3.5-5.1 NOTE: New Reference Range  03/12/14          Passed - Patient is not pregnant      Passed - Last BP in normal range    BP Readings from Last 1 Encounters:  04/29/23 132/80

## 2023-05-16 NOTE — Telephone Encounter (Signed)
 Rx 04/29/23 8g- too soon Requested Prescriptions  Pending Prescriptions Disp Refills   albuterol  (VENTOLIN  HFA) 108 (90 Base) MCG/ACT inhaler [Pharmacy Med Name: ALBUTEROL  HFA*VENT* 108 (90 BAS Aerosol] 90 g 11    Sig: TAKE 2 PUFFS BY MOUTH EVERY 6 HOURS AS NEEDED FOR WHEEZE OR SHORTNESS OF BREATH *NEW PRESCRIPTION REQUEST*     Pulmonology:  Beta Agonists 2 Failed - 05/16/2023  1:57 PM      Failed - Valid encounter within last 12 months    Recent Outpatient Visits           2 weeks ago Benign hypertensive renal disease   [place] [facility] [place], Megan P, DO   1 month ago Benign hypertensive renal disease   [place] [facility] [place], Megan P, DO   1 month ago Benign hypertensive renal disease   [place] [facility] [place], [place] T, NP              Passed - Last BP in normal range    BP Readings from Last 1 Encounters:  04/29/23 132/80         Passed - Last Heart Rate in normal range    Pulse Readings from Last 1 Encounters:  04/29/23 98

## 2023-05-16 NOTE — Telephone Encounter (Signed)
 Requested Prescriptions  Pending Prescriptions Disp Refills   alendronate  (FOSAMAX ) 70 MG tablet [Pharmacy Med Name: ALENDRONATE  NA 70MG  TAB 70 Tablet] 12 tablet 11    Sig: TAKE 1 TABLET(S) BY MOUTH 1 TIMES PER WEEK SATURDAY *NEW PRESCRIPTION REQUEST*     Endocrinology:  Bisphosphonates Failed - 05/16/2023  1:47 PM      Failed - Ca in normal range and within 360 days    Calcium   Date Value Ref Range Status  03/12/2023 8.8 (L) 8.9 - 10.3 mg/dL Final   Calcium , Total  Date Value Ref Range Status  04/26/2014 11.4 (H) mg/dL Final    Comment:    4.0-98.1 NOTE: New Reference Range  03/12/14          Failed - Vitamin D in normal range and within 360 days    No results found for: "[id number]", "AO1308MV7", "[id number]", "25OHVITD3", "25OHVITD2", "25OHVITD1", "VD25OH"       Failed - Mg Level in normal range and within 360 days    Magnesium   Date Value Ref Range Status  05/01/2019 1.9 1.7 - 2.4 mg/dL Final    Comment:    Performed at [facility], [address] [place], [place] [address]  08/17/2013 1.5 (L) mg/dL Final    Comment:    0.1-0.2 THERAPEUTIC RANGE: 4-7 mg/dL TOXIC: > 10 mg/dL  -----------------------          Failed - Phosphate in normal range and within 360 days    No results found for: "PHOS"       Failed - Valid encounter within last 12 months    Recent Outpatient Visits           2 weeks ago Benign hypertensive renal disease   [place] [facility] [place], Megan P, DO   1 month ago Benign hypertensive renal disease   [place] [facility] [place], Megan P, DO   1 month ago Benign hypertensive renal disease   [place] [facility] [place], Sanjuan Crumb[place] T, NP              Failed - Bone Mineral Density or Dexa Scan completed in the last 2 years      Passed - Cr in normal range and within 360 days    Creatinine  Date Value Ref Range Status  04/26/2014 0.82 mg/dL Final    Comment:    7.25-3.66 NOTE:  New Reference Range  03/12/14    Creatinine, Ser  Date Value Ref Range Status  03/12/2023 0.88 0.44 - 1.00 mg/dL Final         Passed - eGFR is 30 or above and within 360 days    EGFR (African American)  Date Value Ref Range Status  04/26/2014 >60  Final   GFR calc Af Amer  Date Value Ref Range Status  08/11/2019 >60 >60 mL/min Final   EGFR (Non-African Amer.)  Date Value Ref Range Status  04/26/2014 >60  Final    Comment:    eGFR values <50mL/min/1.73 m2 may be an indication of chronic kidney disease (CKD). Calculated eGFR is useful in patients with stable renal function. The eGFR calculation will not be reliable in acutely ill patients when serum creatinine is changing rapidly. It is not useful in patients on dialysis. The eGFR calculation may not be applicable to patients at the low and high extremes of body sizes, pregnant women, and vegetarians.    GFR, Estimated  Date Value Ref Range Status  03/12/2023 59 (L) >  60 mL/min Final    Comment:    (NOTE) Calculated using the CKD-EPI Creatinine Equation (2021)    eGFR  Date Value Ref Range Status  11/17/2022 49 (L) >59 mL/min/1.73 Final          TRESIBA FLEXTOUCH 100 UNIT/ML FlexTouch Pen [Pharmacy Med Name: TRESIBA FLEX 100U X15ML 100 INSU] 15 mL 11    Sig: INJECT 10 UNITS SUBCUTANEOUSLY 1 TIMES PER DAY *NEW PRESCRIPTION REQUEST*     Endocrinology:  Diabetes - Insulins Failed - 05/16/2023  1:47 PM      Failed - Valid encounter within last 6 months    Recent Outpatient Visits           2 weeks ago Benign hypertensive renal disease   [place] [facility] [place], Megan P, DO   1 month ago Benign hypertensive renal disease   [place] [facility] [place], Megan P, DO   1 month ago Benign hypertensive renal disease   [place] [facility] [place], Kathleen T, NP              Passed - HBA1C is between 0 and 7.9 and within 180 days    Hemoglobin A1C  Date Value  Ref Range Status  08/04/2020 7.1  Final   HB A1C (BAYER DCA - WAIVED)  Date Value Ref Range Status  11/17/2022 7.8 (H) 4.8 - 5.6 % Final    Comment:             Prediabetes: 5.7 - 6.4          Diabetes: >6.4          Glycemic control for adults with diabetes: <7.0    Hgb A1c MFr Bld  Date Value Ref Range Status  03/11/2023 6.4 (H) 4.8 - 5.6 % Final    Comment:    (NOTE) Pre diabetes:          5.7%-6.4%  Diabetes:              >6.4%  Glycemic control for   <7.0% adults with diabetes           sertraline  (ZOLOFT ) 50 MG tablet [Pharmacy Med Name: SERTRALINE  50 MG TABLET 50 Tablet] 135 tablet 1    Sig: TAKE 1.5 TABLET(S) BY MOUTH 1 TIMES PER DAY *NEW PRESCRIPTION REQUEST*     Psychiatry:  Antidepressants - SSRI - sertraline  Failed - 05/16/2023  1:47 PM      Failed - AST in normal range and within 360 days    AST  Date Value Ref Range Status  11/17/2022 87 (H) 0 - 40 IU/L Final         Failed - ALT in normal range and within 360 days    ALT  Date Value Ref Range Status  11/17/2022 95 (H) 0 - 32 IU/L Final         Failed - Valid encounter within last 6 months    Recent Outpatient Visits           2 weeks ago Benign hypertensive renal disease   [place] [facility] [place], Megan P, DO   1 month ago Benign hypertensive renal disease   [place] [facility] [place], Megan P, DO   1 month ago Benign hypertensive renal disease    [facility] [place], Lavelle Posey, NP              Passed - Completed PHQ-2 or PHQ-9 in  the last 360 days

## 2023-05-16 NOTE — Telephone Encounter (Signed)
 Requested medications are due for refill today.  unsure  Requested medications are on the active medications list.  Insulin  is not.   Last refill. unsure  Future visit scheduled.   yes  Notes to clinic.  Please review for refill. Fosamax  is Historical. Insulin  never prescribed.     Requested Prescriptions  Pending Prescriptions Disp Refills   alendronate  (FOSAMAX ) 70 MG tablet [Pharmacy Med Name: ALENDRONATE  NA 70MG  TAB 70 Tablet] 12 tablet 11    Sig: TAKE 1 TABLET(S) BY MOUTH 1 TIMES PER WEEK SATURDAY *NEW PRESCRIPTION REQUEST*     Endocrinology:  Bisphosphonates Failed - 05/16/2023  1:48 PM      Failed - Ca in normal range and within 360 days    Calcium   Date Value Ref Range Status  03/12/2023 8.8 (L) 8.9 - 10.3 mg/dL Final   Calcium , Total  Date Value Ref Range Status  04/26/2014 11.4 (H) mg/dL Final    Comment:    4.0-98.1 NOTE: New Reference Range  03/12/14          Failed - Vitamin D in normal range and within 360 days    No results found for: "XB1478GN5", "AO1308MV7", "QI696EX5MWU", "25OHVITD3", "25OHVITD2", "25OHVITD1", "VD25OH"       Failed - Mg Level in normal range and within 360 days    Magnesium   Date Value Ref Range Status  05/01/2019 1.9 1.7 - 2.4 mg/dL Final    Comment:    Performed at [facility], [address] [place], [place] [address]  08/17/2013 1.5 (L) mg/dL Final    Comment:    0.1-0.2 THERAPEUTIC RANGE: 4-7 mg/dL TOXIC: > 10 mg/dL  -----------------------          Failed - Phosphate in normal range and within 360 days    No results found for: "PHOS"       Failed - Valid encounter within last 12 months    Recent Outpatient Visits           2 weeks ago Benign hypertensive renal disease   [place] [facility] [place], Megan P, DO   1 month ago Benign hypertensive renal disease   [place] [facility] [place], Megan P, DO   1 month ago Benign hypertensive renal disease   [place] [facility] [place], Sanjuan Crumb[place] T, NP              Failed - Bone Mineral Density or Dexa Scan completed in the last 2 years      Passed - Cr in normal range and within 360 days    Creatinine  Date Value Ref Range Status  04/26/2014 0.82 mg/dL Final    Comment:    7.25-3.66 NOTE: New Reference Range  03/12/14    Creatinine, Ser  Date Value Ref Range Status  03/12/2023 0.88 0.44 - 1.00 mg/dL Final         Passed - eGFR is 30 or above and within 360 days    EGFR (African American)  Date Value Ref Range Status  04/26/2014 >60  Final   GFR calc Af Amer  Date Value Ref Range Status  08/11/2019 >60 >60 mL/min Final   EGFR (Non-African Amer.)  Date Value Ref Range Status  04/26/2014 >60  Final    Comment:    eGFR values <60mL/min/1.73 m2 may be an indication of chronic kidney disease (CKD). Calculated eGFR is useful in patients with stable renal function. The eGFR calculation will not be reliable in acutely  ill patients when serum creatinine is changing rapidly. It is not useful in patients on dialysis. The eGFR calculation may not be applicable to patients at the low and high extremes of body sizes, pregnant women, and vegetarians.    GFR, Estimated  Date Value Ref Range Status  03/12/2023 59 (L) >60 mL/min Final    Comment:    (NOTE) Calculated using the CKD-EPI Creatinine Equation (2021)    eGFR  Date Value Ref Range Status  11/17/2022 49 (L) >59 mL/min/1.73 Final          TRESIBA FLEXTOUCH 100 UNIT/ML FlexTouch Pen [Pharmacy Med Name: TRESIBA FLEX 100U X15ML 100 INSU] 15 mL 11    Sig: INJECT 10 UNITS SUBCUTANEOUSLY 1 TIMES PER DAY *NEW PRESCRIPTION REQUEST*     Endocrinology:  Diabetes - Insulins Failed - 05/16/2023  1:48 PM      Failed - Valid encounter within last 6 months    Recent Outpatient Visits           2 weeks ago Benign hypertensive renal disease   [place] [facility] [place], Megan P, DO   1 month ago Benign hypertensive  renal disease   [place] [facility] [place], Megan P, DO   1 month ago Benign hypertensive renal disease   [place] [facility] [place], [place] T, NP              Passed - HBA1C is between 0 and 7.9 and within 180 days    Hemoglobin A1C  Date Value Ref Range Status  08/04/2020 7.1  Final   HB A1C (BAYER DCA - WAIVED)  Date Value Ref Range Status  11/17/2022 7.8 (H) 4.8 - 5.6 % Final    Comment:             Prediabetes: 5.7 - 6.4          Diabetes: >6.4          Glycemic control for adults with diabetes: <7.0    Hgb A1c MFr Bld  Date Value Ref Range Status  03/11/2023 6.4 (H) 4.8 - 5.6 % Final    Comment:    (NOTE) Pre diabetes:          5.7%-6.4%  Diabetes:              >6.4%  Glycemic control for   <7.0% adults with diabetes          Signed Prescriptions Disp Refills   sertraline  (ZOLOFT ) 50 MG tablet 135 tablet 1    Sig: TAKE 1.5 TABLET(S) BY MOUTH 1 TIMES PER DAY *NEW PRESCRIPTION REQUEST*     Psychiatry:  Antidepressants - SSRI - sertraline  Failed - 05/16/2023  1:48 PM      Failed - AST in normal range and within 360 days    AST  Date Value Ref Range Status  11/17/2022 87 (H) 0 - 40 IU/L Final         Failed - ALT in normal range and within 360 days    ALT  Date Value Ref Range Status  11/17/2022 95 (H) 0 - 32 IU/L Final         Failed - Valid encounter within last 6 months    Recent Outpatient Visits           2 weeks ago Benign hypertensive renal disease   [place] [facility] [place], Megan P, DO   1 month ago Benign hypertensive renal disease  [place] [place], [place] P, DO   1 month ago Benign hypertensive renal disease    [facility] [place], [place] T, NP              Passed - Completed PHQ-2 or PHQ-9 in the last 360 days

## 2023-05-16 NOTE — Telephone Encounter (Signed)
  Requested Prescriptions  Pending Prescriptions Disp Refills   losartan  (COZAAR ) 100 MG tablet [Pharmacy Med Name: LOSARTAN  POTASSIUM 100 MG TAB] 90 tablet 1    Sig: TAKE 1 TABLET BY MOUTH EVERY DAY     Cardiovascular:  Angiotensin Receptor Blockers Failed - 05/16/2023  5:56 PM      Failed - Valid encounter within last 6 months    Recent Outpatient Visits           2 weeks ago Benign hypertensive renal disease   [place] [place], Megan P, DO   1 month ago Benign hypertensive renal disease   [place] [facility] [place], Megan P, DO   1 month ago Benign hypertensive renal disease   [place] [facility] [place], Sanjuan Crumbly T, NP              Passed - Cr in normal range and within 180 days    Creatinine  Date Value Ref Range Status  04/26/2014 0.82 mg/dL Final    Comment:    5.62-1.30 NOTE: New Reference Range  03/12/14    Creatinine, Ser  Date Value Ref Range Status  03/12/2023 0.88 0.44 - 1.00 mg/dL Final         Passed - K in normal range and within 180 days    Potassium  Date Value Ref Range Status  03/12/2023 3.7 3.5 - 5.1 mmol/L Final  04/26/2014 3.8 mmol/L Final    Comment:    3.5-5.1 NOTE: New Reference Range  03/12/14          Passed - Patient is not pregnant      Passed - Last BP in normal range    BP Readings from Last 1 Encounters:  04/29/23 132/80

## 2023-05-17 ENCOUNTER — Ambulatory Visit: Payer: Self-pay | Admitting: Family Medicine

## 2023-05-18 ENCOUNTER — Other Ambulatory Visit: Payer: Self-pay | Admitting: Family Medicine

## 2023-05-18 NOTE — Telephone Encounter (Unsigned)
 Copied from CRM [id number]. Topic: Clinical - Medication Refill >> May 18, 2023  2:21 PM Santiya F wrote: Medication:mirtazapine  (REMERON ) 45 MG tablet [[id number]],  losartan  (COZAAR ) 100 MG tablet [[id number]], albuterol  microgram inhaler, vitanin d3, preservision areds 2,   Has the patient contacted their pharmacy? Yes  (Agent: If yes, when and what did the pharmacy advise?) pharmacy called in request  This is the patient's preferred pharmacy:   [facility] [place], [place] - [address] [place] [address] Phone: [phone] Fax: [phone]  Is this the correct pharmacy for this prescription? Yes If no, delete pharmacy and type the correct one.   Has the prescription been filled recently? Yes  Is the patient out of the medication? Yes  Has the patient been seen for an appointment in the last year OR does the patient have an upcoming appointment? Yes  Can we respond through MyChart? No  Agent: Please be advised that Rx refills may take up to 3 business days. We ask that you follow-up with your pharmacy.

## 2023-05-19 MED ORDER — MIRTAZAPINE 45 MG PO TABS: 45.0000 mg | ORAL_TABLET | Freq: Every day | ORAL | 0 refills | Status: DC

## 2023-05-19 MED ORDER — GNP VITAMIN D3 EXTRA STRENGTH 25 MCG (1000 UT) PO TABS: 1000.0000 [IU] | ORAL_TABLET | Freq: Every day | ORAL | 1 refills | Status: AC

## 2023-05-19 MED ORDER — ALBUTEROL SULFATE HFA 108 (90 BASE) MCG/ACT IN AERS: 2.0000 | INHALATION_SPRAY | Freq: Four times a day (QID) | RESPIRATORY_TRACT | 2 refills | Status: DC | PRN

## 2023-05-19 MED ORDER — LOSARTAN POTASSIUM 100 MG PO TABS: 100.0000 mg | ORAL_TABLET | Freq: Every day | ORAL | 1 refills | Status: DC

## 2023-05-19 NOTE — Telephone Encounter (Signed)
 Requested Prescriptions  Pending Prescriptions Disp Refills   mirtazapine  (REMERON ) 45 MG tablet 90 tablet 0    Sig: Take 1 tablet (45 mg total) by mouth at bedtime.     Psychiatry: Antidepressants - mirtazapine  Failed - 05/19/2023  4:28 PM      Failed - Valid encounter within last 6 months    Recent Outpatient Visits           2 weeks ago Benign hypertensive renal disease   [place] [facility] [place], Megan P, DO   1 month ago Benign hypertensive renal disease   [place] [facility] [place], Megan P, DO   2 months ago Benign hypertensive renal disease   [place] [facility] [place], Sanjuan Crumbly T, NP              Passed - Completed PHQ-2 or PHQ-9 in the last 360 days       albuterol  (VENTOLIN  HFA) 108 (90 Base) MCG/ACT inhaler 8 g 2    Sig: Inhale 2 puffs into the lungs every 6 (six) hours as needed for wheezing or shortness of breath.     Pulmonology:  Beta Agonists 2 Failed - 05/19/2023  4:28 PM      Failed - Valid encounter within last 12 months    Recent Outpatient Visits           2 weeks ago Benign hypertensive renal disease   [place] [facility] [place], Megan P, DO   1 month ago Benign hypertensive renal disease   [place] [facility] [place], Megan P, DO   2 months ago Benign hypertensive renal disease   [place] [facility] [place], [place] T, NP              Passed - Last BP in normal range    BP Readings from Last 1 Encounters:  04/29/23 132/80         Passed - Last Heart Rate in normal range    Pulse Readings from Last 1 Encounters:  04/29/23 98          losartan  (COZAAR ) 100 MG tablet 90 tablet 1    Sig: Take 1 tablet (100 mg total) by mouth daily.     Cardiovascular:  Angiotensin Receptor Blockers Failed - 05/19/2023  4:28 PM      Failed - Valid encounter within last 6 months    Recent Outpatient Visits           2 weeks ago Benign hypertensive  renal disease   [place] [facility] [place], Megan P, DO   1 month ago Benign hypertensive renal disease   [place] [facility] [place], Megan P, DO   2 months ago Benign hypertensive renal disease   [place] [facility] [place], Sanjuan Crumbly T, NP              Passed - Cr in normal range and within 180 days    Creatinine  Date Value Ref Range Status  04/26/2014 0.82 mg/dL Final    Comment:    4.09-8.11 NOTE: New Reference Range  03/12/14    Creatinine, Ser  Date Value Ref Range Status  03/12/2023 0.88 0.44 - 1.00 mg/dL Final         Passed - K in normal range and within 180 days    Potassium  Date Value Ref Range Status  03/12/2023 3.7 3.5 - 5.1 mmol/L Final  04/26/2014 3.8  mmol/L Final    Comment:    3.5-5.1 NOTE: New Reference Range  03/12/14          Passed - Patient is not pregnant      Passed - Last BP in normal range    BP Readings from Last 1 Encounters:  04/29/23 132/80          Multiple Vitamins-Minerals (GNP HEALTHY EYES SUPERVISION 2) CAPS 180 capsule 3    Sig: Take 1 capsule by mouth 2 (two) times daily.     There is no refill protocol information for this order     Cholecalciferol (GNP VITAMIN D3 EXTRA STRENGTH) 25 MCG (1000 UT) tablet 90 tablet 1     Endocrinology:  Vitamins - Vitamin D Supplementation 2 Failed - 05/19/2023  4:28 PM      Failed - Manual Review: Route requests for 50,000 IU strength to the provider      Failed - Ca in normal range and within 360 days    Calcium   Date Value Ref Range Status  03/12/2023 8.8 (L) 8.9 - 10.3 mg/dL Final   Calcium , Total  Date Value Ref Range Status  04/26/2014 11.4 (H) mg/dL Final    Comment:    1.6-10.9 NOTE: New Reference Range  03/12/14          Failed - Vitamin D in normal range and within 360 days    No results found for: "UE4540JW1", "XB1478GN5", "VD125OH2TOT", "25OHVITD3", "25OHVITD2", "25OHVITD1", "VD25OH"       Failed - Valid  encounter within last 12 months    Recent Outpatient Visits           2 weeks ago Benign hypertensive renal disease   [place] [facility] [place], Megan P, DO   1 month ago Benign hypertensive renal disease   [place] [facility] [place], Megan P, DO   2 months ago Benign hypertensive renal disease   [place] [facility] Lemar Pyles, NP

## 2023-05-19 NOTE — Telephone Encounter (Signed)
 Requested medications are due for refill today.  yes  Requested medications are on the active medications list.  yes  Last refill. Varied - pt wants meds from another pharmacy  Future visit scheduled.   yes  Notes to clinic.  No protocol for vitamins.    Requested Prescriptions  Pending Prescriptions Disp Refills   Multiple Vitamins-Minerals (GNP HEALTHY EYES SUPERVISION 2) CAPS 180 capsule 3    Sig: Take 1 capsule by mouth 2 (two) times daily.     There is no refill protocol information for this order    Signed Prescriptions Disp Refills   mirtazapine  (REMERON ) 45 MG tablet 90 tablet 0    Sig: Take 1 tablet (45 mg total) by mouth at bedtime.     Psychiatry: Antidepressants - mirtazapine  Failed - 05/19/2023  4:28 PM      Failed - Valid encounter within last 6 months    Recent Outpatient Visits           2 weeks ago Benign hypertensive renal disease   [place] [facility] [place], Megan P, DO   1 month ago Benign hypertensive renal disease   [place] [facility] [place], Megan P, DO   2 months ago Benign hypertensive renal disease   [place] [facility] [place], Lavelle Posey, NP              Passed - Completed PHQ-2 or PHQ-9 in the last 360 days       albuterol  (VENTOLIN  HFA) 108 (90 Base) MCG/ACT inhaler 8 g 2    Sig: Inhale 2 puffs into the lungs every 6 (six) hours as needed for wheezing or shortness of breath.     Pulmonology:  Beta Agonists 2 Failed - 05/19/2023  4:28 PM      Failed - Valid encounter within last 12 months    Recent Outpatient Visits           2 weeks ago Benign hypertensive renal disease   [place] [facility] [place], Megan P, DO   1 month ago Benign hypertensive renal disease   [place] [facility] [place], Megan P, DO   2 months ago Benign hypertensive renal disease   [place] [facility] [place], [place] T, NP              Passed - Last BP  in normal range    BP Readings from Last 1 Encounters:  04/29/23 132/80         Passed - Last Heart Rate in normal range    Pulse Readings from Last 1 Encounters:  04/29/23 98          losartan  (COZAAR ) 100 MG tablet 90 tablet 1    Sig: Take 1 tablet (100 mg total) by mouth daily.     Cardiovascular:  Angiotensin Receptor Blockers Failed - 05/19/2023  4:28 PM      Failed - Valid encounter within last 6 months    Recent Outpatient Visits           2 weeks ago Benign hypertensive renal disease   [place] [facility] [place], Megan P, DO   1 month ago Benign hypertensive renal disease   [place] [facility] [place], Megan P, DO   2 months ago Benign hypertensive renal disease    [facility] [place], Lavelle Posey, NP              Passed -  Cr in normal range and within 180 days    Creatinine  Date Value Ref Range Status  04/26/2014 0.82 mg/dL Final    Comment:    6.04-5.40 NOTE: New Reference Range  03/12/14    Creatinine, Ser  Date Value Ref Range Status  03/12/2023 0.88 0.44 - 1.00 mg/dL Final         Passed - K in normal range and within 180 days    Potassium  Date Value Ref Range Status  03/12/2023 3.7 3.5 - 5.1 mmol/L Final  04/26/2014 3.8 mmol/L Final    Comment:    3.5-5.1 NOTE: New Reference Range  03/12/14          Passed - Patient is not pregnant      Passed - Last BP in normal range    BP Readings from Last 1 Encounters:  04/29/23 132/80          Cholecalciferol (GNP VITAMIN D3 EXTRA STRENGTH) 25 MCG (1000 UT) tablet 90 tablet 1    Sig: Take 1 tablet (1,000 Units total) by mouth daily.     Endocrinology:  Vitamins - Vitamin D Supplementation 2 Failed - 05/19/2023  4:28 PM      Failed - Manual Review: Route requests for 50,000 IU strength to the provider      Failed - Ca in normal range and within 360 days    Calcium   Date Value Ref Range Status  03/12/2023 8.8 (L) 8.9 - 10.3 mg/dL  Final   Calcium , Total  Date Value Ref Range Status  04/26/2014 11.4 (H) mg/dL Final    Comment:    9.8-11.9 NOTE: New Reference Range  03/12/14          Failed - Vitamin D in normal range and within 360 days    No results found for: "JY7829FA2", "ZH0865HQ4", "VD125OH2TOT", "25OHVITD3", "25OHVITD2", "25OHVITD1", "VD25OH"       Failed - Valid encounter within last 12 months    Recent Outpatient Visits           2 weeks ago Benign hypertensive renal disease   [place] [facility] [place], Megan P, DO   1 month ago Benign hypertensive renal disease   [place] [facility] [place], Megan P, DO   2 months ago Benign hypertensive renal disease   [place] [facility] Lemar Pyles, NP

## 2023-05-20 ENCOUNTER — Other Ambulatory Visit: Payer: Self-pay | Admitting: Family Medicine

## 2023-05-20 MED ORDER — GNP HEALTHY EYES SUPERVISION 2 PO CAPS: 1.0000 | ORAL_CAPSULE | Freq: Two times a day (BID) | ORAL | 3 refills | Status: AC

## 2023-05-20 NOTE — Telephone Encounter (Signed)
 Copied from CRM [id number]. Topic: Clinical - Medication Refill >> May 20, 2023  2:27 PM Lynnie Saucier S wrote: Medication: Cholecalciferol (GNP VITAMIN D3 EXTRA STRENGTH) 25 MCG (1000 UT) tablet  Has the patient contacted their pharmacy? Yes (Agent: If no, request that the patient contact the pharmacy for the refill. If patient does not wish to contact the pharmacy document the reason why and proceed with request.) (Agent: If yes, when and what did the pharmacy advise?)Pharmacy called to say they did not receive the prescription.  This is the patient's preferred pharmacy:   [facility], [place] - [address] [place] [address] Phone: [phone] Fax: [phone]  Is this the correct pharmacy for this prescription? Yes If no, delete pharmacy and type the correct one.   Has the prescription been filled recently? No  Is the patient out of the medication? Yes  Has the patient been seen for an appointment in the last year OR does the patient have an upcoming appointment? Yes  Can we respond through MyChart? No  Agent: Please be advised that Rx refills may take up to 3 business days. We ask that you follow-up with your pharmacy.

## 2023-05-23 ENCOUNTER — Ambulatory Visit (INDEPENDENT_AMBULATORY_CARE_PROVIDER_SITE_OTHER): Admitting: Family Medicine

## 2023-05-23 ENCOUNTER — Encounter: Payer: Self-pay | Admitting: Family Medicine

## 2023-05-23 VITALS — BP 126/80 | HR 96 | Ht 59.0 in | Wt 102.6 lb

## 2023-05-23 DIAGNOSIS — F0390 Unspecified dementia without behavioral disturbance: Secondary | ICD-10-CM | POA: Diagnosis not present

## 2023-05-23 DIAGNOSIS — F339 Major depressive disorder, recurrent, unspecified: Secondary | ICD-10-CM | POA: Diagnosis not present

## 2023-05-23 MED ORDER — ATORVASTATIN CALCIUM 40 MG PO TABS: ORAL_TABLET | ORAL | 1 refills | Status: DC

## 2023-05-23 MED ORDER — MIRTAZAPINE 45 MG PO TABS: 45.0000 mg | ORAL_TABLET | Freq: Every day | ORAL | 1 refills | Status: DC

## 2023-05-23 NOTE — Telephone Encounter (Signed)
 Copied from CRM [id number]. Topic: Clinical - Medication Refill >> May 20, 2023  2:27 PM Lynnie Saucier S wrote: Medication: Cholecalciferol (GNP VITAMIN D3 EXTRA STRENGTH) 25 MCG (1000 UT) tablet  Has the patient contacted their pharmacy? Yes (Agent: If no, request that the patient contact the pharmacy for the refill. If patient does not wish to contact the pharmacy document the reason why and proceed with request.) (Agent: If yes, when and what did the pharmacy advise?)Pharmacy called to say they did not receive the prescription.  This is the patient's preferred pharmacy:   [facility], [place] - [address] [place] [address] Phone: [phone] Fax: [phone]  Is this the correct pharmacy for this prescription? Yes If no, delete pharmacy and type the correct one.   Has the prescription been filled recently? No  Is the patient out of the medication? Yes  Has the patient been seen for an appointment in the last year OR does the patient have an upcoming appointment? Yes  Can we respond through MyChart? No  Agent: Please be advised that Rx refills may take up to 3 business days. We ask that you follow-up with your pharmacy. >> May 23, 2023  9:41 AM [place] A wrote: Christa from Pharmacy called to follow up on medication refill

## 2023-05-23 NOTE — Telephone Encounter (Signed)
 Sent to pharmacy on 05/19/2023.

## 2023-05-23 NOTE — Progress Notes (Signed)
 BP 126/80 (BP Location: Left Arm, Patient Position: Sitting, Cuff Size: Small)   Pulse 96   Ht 4\' 11"  (1.499 m)   Wt 102 lb 9.6 oz (46.5 kg)   BMI 20.72 kg/m    Subjective:    Patient ID: Angela Neal, female    DOB: January 10, 1921, 89 y.o.   MRN: [id number]  HPI: Angela Neal is a 89 y.o. female  Chief Complaint  Patient presents with   Depression   Inhaler does seem like it's helping.   DEPRESSION- had 1 episode of wandering at night, but does seem to be sleeping better. Daughter notes that her mood is up and down. She notes that she is doing better.  Mood status: better Satisfied with current treatment?: yes Symptom severity: fluctuating  Duration of current treatment : months Side effects: no Medication compliance: good compliance Psychotherapy/counseling: no  Previous psychiatric medications: sertraline , mirtazapine  Depressed mood: no Anxious mood: no Anhedonia: no Significant weight loss or gain: no Insomnia: yes hard to stay asleep Fatigue: no Feelings of worthlessness or guilt: no Impaired concentration/indecisiveness: no Suicidal ideations: no Hopelessness: no Crying spells: no    05/23/2023    2:29 PM 03/28/2023    2:24 PM 02/04/2023    3:19 PM 08/17/2022    1:21 PM 01/19/2022    1:11 PM  Depression screen PHQ 2/9  Decreased Interest 3 0 3 2 0  Down, Depressed, Hopeless 2 0 0 0 0  PHQ - 2 Score 5 0 3 2 0  Altered sleeping 2 0 1 0 0  Tired, decreased energy 2 0 3 1 0  Change in appetite 3 0 3 3 1   Feeling bad or failure about yourself  2 0 0 1 0  Trouble concentrating 3 0 3 1 0  Moving slowly or fidgety/restless 2 0 0 0 0  Suicidal thoughts 1 0 0 0 0  PHQ-9 Score 20 0 13 8 1   Difficult doing work/chores Somewhat difficult Not difficult at all  Not difficult at all Very difficult    Relevant past medical, surgical, family and social history reviewed and updated as indicated. Interim medical history since our last visit reviewed. Allergies and  medications reviewed and updated.  Review of Systems  Constitutional: Negative.   Respiratory: Negative.    Cardiovascular: Negative.   Musculoskeletal: Negative.   Psychiatric/Behavioral:  Positive for agitation, confusion, dysphoric mood and sleep disturbance. Negative for behavioral problems, decreased concentration, hallucinations, self-injury and suicidal ideas. The patient is nervous/anxious. The patient is not hyperactive.     Per HPI unless specifically indicated above     Objective:     BP 126/80 (BP Location: Left Arm, Patient Position: Sitting, Cuff Size: Small)   Pulse 96   Ht 4\' 11"  (1.499 m)   Wt 102 lb 9.6 oz (46.5 kg)   BMI 20.72 kg/m   Wt Readings from Last 3 Encounters:  05/23/23 102 lb 9.6 oz (46.5 kg)  04/29/23 103 lb 8 oz (46.9 kg)  03/28/23 106 lb (48.1 kg)    Physical Exam Vitals and nursing note reviewed.  Constitutional:      General: She is not in acute distress.    Appearance: Normal appearance. She is not ill-appearing, toxic-appearing or diaphoretic.  HENT:     Head: Normocephalic and atraumatic.     Right Ear: External ear normal.     Left Ear: External ear normal.     Nose: Nose normal.     Mouth/Throat:  Mouth: Mucous membranes are moist.     Pharynx: Oropharynx is clear.  Eyes:     General: No scleral icterus.       Right eye: No discharge.        Left eye: No discharge.     Extraocular Movements: Extraocular movements intact.     Conjunctiva/sclera: Conjunctivae normal.     Pupils: Pupils are equal, round, and reactive to light.  Cardiovascular:     Rate and Rhythm: Normal rate and regular rhythm.     Pulses: Normal pulses.     Heart sounds: Murmur heard.     No friction rub. No gallop.  Pulmonary:     Effort: Pulmonary effort is normal. No respiratory distress.     Breath sounds: Normal breath sounds. No stridor. No wheezing, rhonchi or rales.  Chest:     Chest wall: No tenderness.  Musculoskeletal:        General: Normal  range of motion.     Cervical back: Normal range of motion and neck supple.  Skin:    General: Skin is warm and dry.     Capillary Refill: Capillary refill takes less than 2 seconds.     Coloration: Skin is not jaundiced or pale.     Findings: No bruising, erythema, lesion or rash.  Neurological:     General: No focal deficit present.     Mental Status: She is alert and oriented to person, place, and time. Mental status is at baseline.  Psychiatric:        Mood and Affect: Mood normal.        Behavior: Behavior normal.        Thought Content: Thought content normal.        Judgment: Judgment normal.     Results for orders placed or performed in visit on 03/28/23  Urinalysis, Routine w reflex microscopic   Collection Time: 04/04/23  9:30 AM  Result Value Ref Range   Specific Gravity, UA 1.010 1.005 - 1.030   pH, UA 6.0 5.0 - 7.5   Color, UA Yellow Yellow   Appearance Ur Clear Clear   Leukocytes,UA Negative Negative   Protein,UA Negative Negative/Trace   Glucose, UA Negative Negative   Ketones, UA Negative Negative   RBC, UA Negative Negative   Bilirubin, UA Negative Negative   Urobilinogen, Ur 0.2 0.2 - 1.0 mg/dL   Nitrite, UA Negative Negative   Microscopic Examination Comment   Urine Culture   Collection Time: 04/04/23 10:20 AM   Specimen: Urine   UR  Result Value Ref Range   Urine Culture, Routine Final report    Organism ID, Bacteria Comment       Assessment & Plan:   Problem List Items Addressed This Visit       Nervous and Auditory   Chronic dementia without behavioral disturbance (HCC) - Primary   Doing better on the higher dose of mirtazapine . Continue current regimen. Continue to monitor. Call with any concerns.       Relevant Medications   mirtazapine  (REMERON ) 45 MG tablet     Other   Depression, recurrent (HCC)   Significantly better despite PHQ9. Will continue current regimen. Continue to monitor. Call with any concerns.       Relevant  Medications   mirtazapine  (REMERON ) 45 MG tablet     Follow up plan: Return in about 3 months (around 08/23/2023).

## 2023-05-23 NOTE — Assessment & Plan Note (Signed)
 Significantly better despite PHQ9. Will continue current regimen. Continue to monitor. Call with any concerns.

## 2023-05-23 NOTE — Assessment & Plan Note (Signed)
 Doing better on the higher dose of mirtazapine . Continue current regimen. Continue to monitor. Call with any concerns.

## 2023-05-26 DIAGNOSIS — Z794 Long term (current) use of insulin: Secondary | ICD-10-CM | POA: Diagnosis not present

## 2023-05-26 DIAGNOSIS — F5104 Psychophysiologic insomnia: Secondary | ICD-10-CM | POA: Diagnosis not present

## 2023-05-26 DIAGNOSIS — Z7984 Long term (current) use of oral hypoglycemic drugs: Secondary | ICD-10-CM | POA: Diagnosis not present

## 2023-05-26 DIAGNOSIS — Z7983 Long term (current) use of bisphosphonates: Secondary | ICD-10-CM | POA: Diagnosis not present

## 2023-05-26 DIAGNOSIS — E1169 Type 2 diabetes mellitus with other specified complication: Secondary | ICD-10-CM | POA: Diagnosis not present

## 2023-05-26 DIAGNOSIS — E785 Hyperlipidemia, unspecified: Secondary | ICD-10-CM | POA: Diagnosis not present

## 2023-05-26 DIAGNOSIS — F0393 Unspecified dementia, unspecified severity, with mood disturbance: Secondary | ICD-10-CM | POA: Diagnosis not present

## 2023-05-26 DIAGNOSIS — I129 Hypertensive chronic kidney disease with stage 1 through stage 4 chronic kidney disease, or unspecified chronic kidney disease: Secondary | ICD-10-CM | POA: Diagnosis not present

## 2023-05-26 DIAGNOSIS — F339 Major depressive disorder, recurrent, unspecified: Secondary | ICD-10-CM | POA: Diagnosis not present

## 2023-05-26 DIAGNOSIS — J449 Chronic obstructive pulmonary disease, unspecified: Secondary | ICD-10-CM | POA: Diagnosis not present

## 2023-05-26 DIAGNOSIS — N183 Chronic kidney disease, stage 3 unspecified: Secondary | ICD-10-CM | POA: Diagnosis not present

## 2023-05-26 DIAGNOSIS — E1122 Type 2 diabetes mellitus with diabetic chronic kidney disease: Secondary | ICD-10-CM | POA: Diagnosis not present

## 2023-06-21 DIAGNOSIS — E1159 Type 2 diabetes mellitus with other circulatory complications: Secondary | ICD-10-CM | POA: Diagnosis not present

## 2023-06-21 DIAGNOSIS — N1831 Chronic kidney disease, stage 3a: Secondary | ICD-10-CM | POA: Diagnosis not present

## 2023-06-21 DIAGNOSIS — E1129 Type 2 diabetes mellitus with other diabetic kidney complication: Secondary | ICD-10-CM | POA: Diagnosis not present

## 2023-06-21 DIAGNOSIS — M810 Age-related osteoporosis without current pathological fracture: Secondary | ICD-10-CM | POA: Diagnosis not present

## 2023-06-21 DIAGNOSIS — R809 Proteinuria, unspecified: Secondary | ICD-10-CM | POA: Diagnosis not present

## 2023-06-21 DIAGNOSIS — E113313 Type 2 diabetes mellitus with moderate nonproliferative diabetic retinopathy with macular edema, bilateral: Secondary | ICD-10-CM | POA: Diagnosis not present

## 2023-06-21 DIAGNOSIS — E1122 Type 2 diabetes mellitus with diabetic chronic kidney disease: Secondary | ICD-10-CM | POA: Diagnosis not present

## 2023-06-21 DIAGNOSIS — Z8781 Personal history of (healed) traumatic fracture: Secondary | ICD-10-CM | POA: Diagnosis not present

## 2023-06-21 DIAGNOSIS — Z794 Long term (current) use of insulin: Secondary | ICD-10-CM | POA: Diagnosis not present

## 2023-06-21 NOTE — Progress Notes (Signed)
 Angela Neal is a 89 y.o. female seen in follow up. She was last seen on 03/22/2023. She is here today with her adult daughter who is her caretaker. Sofia has dementia and daughter gives 100% of the history.   Type 2 diabetes. Her Hb A1c is 5.9%. She brings in a blood glucose log. Sugars are in the 110 - 185 mg/dl range, fasting and 94 - 360 mg/dl range before supper. Her diabetes medications include: TRESIBA 8 units nightly NOVOLOG  TID before meals: NONE IF SUGAR 70 OR LESS.  6 UNITS IF 71-200 7 UNITS IF 201-300 8 UNITS IF SUGAR IS 301 OR HIGHER  Patient has variable PO intake. Appetite is unchanged. Some meals pt eats very little and other meals she will eat more. Breakfast tends to be the best meal.  She had been using the DexCom G7 CGM in 10/2022 however it was unreliable and her readings on DexCom did not match readings on her glucometer (too high at times and too low at other).   Her diabetes is complicated by stage 3a CKD, vascular disease, and diabetic retinopathy. She has macular degeneration and bilat moderate retinopathy. She follows with ophthalmology and last exam was on 11/22/2022.   Prior medications tried for diabetes:  Metformin - stopped in 2022 as it caused excessive loose stools.    Osteoporosis. She has history of multiple fractures including pelvis, vertebrae, right femur and left arm. Last DEXA was completed on 02/05/2022. She took alendronate  from 05/2014 - 05/2019 and it was restarted in 09/2022. Last DEXA scan on 02/2022 showed an interval improvement in her BMD at the spine, however she had persistent severe osteoporosis.   Past Medical History:  Diagnosis Date  . Adrenal adenoma, unspecified laterality 06/05/2017   Noted on Chest CT 05/27/2017  . Anemia    hospitalized in 3/10  . Cholelithiasis 06/05/2017   Asymptomatic  As of 05/2017 Incidental finding noted on CT chest at [facility] 05/27/2017  . Chronic insomnia   . CVA (cerebral vascular accident) (CMS/HHS-HCC) 09/2002    full recovery  . Depression   . Diverticulosis   . Hyperlipidemia   . Hypertension   . Macular degeneration   . Multinodular goiter 06/05/2017  . Nephrolithiasis    followed by Dr. Ike, s/p stent 6/09  . Osteoporosis   . Peptic ulcer   . Type 2 diabetes mellitus (CMS/HHS-HCC)     Outpatient Medications Marked as Taking for the 06/21/23 encounter (Office Visit) with Solum, Anna Melissa, MD  Medication Sig Dispense Refill  . albuterol  MDI, PROVENTIL , VENTOLIN , PROAIR , HFA 90 mcg/actuation inhaler Inhale 2 inhalations into the lungs every 6 (six) hours as needed for Wheezing    . alendronate  (FOSAMAX ) 70 MG tablet Take 1 tablet (70 mg total) by mouth every 7 (seven) days   12 tablet 3  . atorvastatin  (LIPITOR) 40 MG tablet TAKE 1 TABLET BY MOUTH ONCE EVERY DAY 30 tablet 5  . calcium  carbonate-vitamin D3 (CALTRATE 600+D) 600 mg-10 mcg (400 unit) tablet Take 1 tablet by mouth daily with breakfast    . CHOLECALCIFEROL 25 mcg (1,000 unit) tablet TAKE 1 TABLET BY MOUTH ONCE EVERY DAY FOR SUPPLEMENT 90 tablet 3  . fluticasone  propionate (FLONASE ) 50 mcg/actuation nasal spray Place 2 sprays into one nostril once daily    . hydrALAZINE  (APRESOLINE ) 10 MG tablet Take 1 tablet (10 mg total) by mouth 2 (two) times daily as needed. May give for blood pressure >140/90.    . hydroCHLOROthiazide  (HYDRODIURIL ) 25  MG tablet Take 1 tablet by mouth once daily    . insulin  DEGLUDEC (TRESIBA FLEXTOUCH U-100) pen injector (concentration 100 units/mL) Inject 8 units nightly  15 mL 5  . losartan  (COZAAR ) 100 MG tablet Take 1 tablet (100 mg total) by mouth once daily Do NOT resume until you talk with your PCP    . METAMUCIL 0.4 gram Cap TAKE 1 CAPSULE BY MOUTH ONCE DAILY 30 capsule 5  . mirtazapine  (REMERON ) 45 MG tablet Take 45 mg by mouth at bedtime    . montelukast  (SINGULAIR ) 10 mg tablet Take 1 tablet (10 mg total) by mouth nightly 90 tablet 1  . NOVOLOG  FLEXPEN U-100 INSULIN  pen injector (concentration 100  units/mL) Take as directed, max daily dose is 50 units. 15 mL 3  . sertraline  (ZOLOFT ) 50 MG tablet Take 1.5 tablets (75 mg total) by mouth once daily 135 tablet 2  . SITagliptin  phosphate (JANUVIA ) 100 MG tablet Take 1 tablet (100 mg total) by mouth once daily 90 tablet 1  . VIT C/E/ZN/COPPR/LUTEIN/ ZEAXAN (PRESERVISION AREDS 2 ORAL) Take by mouth 2 (two) times daily      Exam: BP (!) 140/70   Pulse 94   Ht 144.8 cm (4' 9)   Wt 46.7 kg (103 lb)   SpO2 100%   BMI 22.29 kg/m  GEN: underweight elderly female in NAD. SKIN: A bilateral bare foot exam shows no ulcerations or calluses.  EXT: No peripheral edema PSYC: alert and oriented, poor insight, speaks very little.   Labs  Lab Results  Component Value Date   HGBA1C 5.9 (H) 06/21/2023   HGBA1C 7.0 (H) 12/21/2022    Assessment: 1. Type 2 diabetes mellitus with both eyes affected by moderate nonproliferative retinopathy and macular edema, with long-term current use of insulin  (CMS/HHS-HCC)   2. Type 2 diabetes mellitus with stage 3a chronic kidney disease, with long-term current use of insulin  (CMS/HHS-HCC)   3. Type 2 diabetes mellitus with microalbuminuria, with long-term current use of insulin  (CMS/HHS-HCC)   4. Osteoporosis, postmenopausal   5. History of pelvic fracture     Plan: -  Counseled them that her diabetes is well controlled.  -  Continue Tresiba 8 units qhs.  -  Continue NovoLog  TID AC as she is doing.   -  Advised about hypoglycemia, what it is, signs/symptoms and appropriate treatment.    -  Confirmed that pt and daughter did not like CGMs.  -  Continue ARB for hypertension and microalbuminuria.  -  Continue annual eye exams, she is up to date.  -  Continue alendronate  70 mg weekly for osteoporosis.  -  Continue calcium  + Vit D.  -  Avoid falls. Use walker.  -  Follow up in 3 - 4 months or sooner if needed.

## 2023-06-23 ENCOUNTER — Other Ambulatory Visit: Payer: Self-pay | Admitting: Family Medicine

## 2023-06-24 NOTE — Telephone Encounter (Signed)
 Requested medication (s) are due for refill today: yes   Requested medication (s) are on the active medication list: yes   Last refill:  05/16/23 #30 ml 0 refills  Future visit scheduled: yes 08/25/23  Notes to clinic:  no refills remain. Do you want to refill Rx?     Requested Prescriptions  Pending Prescriptions Disp Refills   NOVOLOG  FLEXPEN 100 UNIT/ML FlexPen [Pharmacy Med Name: NOVOLOG  FLEXPEN 100 INSU] 30 mL 11    Sig: INJECT 10 UNITS SUBCUTANEOUSLY THREE TIMES A DAY     Endocrinology:  Diabetes - Insulins Failed - 06/24/2023  4:01 PM      Failed - Valid encounter within last 6 months    Recent Outpatient Visits           1 month ago Chronic dementia without behavioral disturbance (HCC)   [place] [facility] [place], Megan P, DO   1 month ago Benign hypertensive renal disease   [place] [facility] [place], Megan P, DO   2 months ago Benign hypertensive renal disease   [facility] [place], Megan P, DO   3 months ago Benign hypertensive renal disease   [place] [facility] [place], [place] T, NP              Passed - HBA1C is between 0 and 7.9 and within 180 days    Hemoglobin A1C  Date Value Ref Range Status  08/04/2020 7.1  Final   HB A1C (BAYER DCA - WAIVED)  Date Value Ref Range Status  11/17/2022 7.8 (H) 4.8 - 5.6 % Final    Comment:             Prediabetes: 5.7 - 6.4          Diabetes: >6.4          Glycemic control for adults with diabetes: <7.0    Hgb A1c MFr Bld  Date Value Ref Range Status  03/11/2023 6.4 (H) 4.8 - 5.6 % Final    Comment:    (NOTE) Pre diabetes:          5.7%-6.4%  Diabetes:              >6.4%  Glycemic control for   <7.0% adults with diabetes

## 2023-06-28 DIAGNOSIS — S92344A Nondisplaced fracture of fourth metatarsal bone, right foot, initial encounter for closed fracture: Secondary | ICD-10-CM | POA: Diagnosis not present

## 2023-06-28 DIAGNOSIS — S92301A Fracture of unspecified metatarsal bone(s), right foot, initial encounter for closed fracture: Secondary | ICD-10-CM | POA: Diagnosis not present

## 2023-06-29 ENCOUNTER — Other Ambulatory Visit: Payer: Self-pay | Admitting: Family Medicine

## 2023-06-30 NOTE — Telephone Encounter (Signed)
 Requested Prescriptions  Pending Prescriptions Disp Refills   montelukast  (SINGULAIR ) 10 MG tablet [Pharmacy Med Name: MONTELUKAST  10 MG TAB 10 Tablet] 90 tablet 11    Sig: TAKE 1 TABLET BY MOUTH NIGHTLY     Pulmonology:  Leukotriene Inhibitors Failed - 06/30/2023  5:21 PM      Failed - Valid encounter within last 12 months    Recent Outpatient Visits           1 month ago Chronic dementia without behavioral disturbance [facility])   [place] [facility] [place], Megan P, DO   2 months ago Benign hypertensive renal disease   [place] [facility] [place], Megan P, DO   3 months ago Benign hypertensive renal disease   [place] [facility] [place], Megan P, DO   3 months ago Benign hypertensive renal disease   [place] [facility] [place], Melanie DASEN, NP

## 2023-07-01 ENCOUNTER — Other Ambulatory Visit: Payer: Self-pay | Admitting: Family Medicine

## 2023-07-04 NOTE — Telephone Encounter (Signed)
 Too soon for refill, refilled on 05/19/23 for 90 days.  Requested Prescriptions  Pending Prescriptions Disp Refills   mirtazapine  (REMERON ) 45 MG tablet [Pharmacy Med Name: MIRTAZAPINE  45 MG TABLET 45 Tablet] 90 tablet 11    Sig: TAKE 1 TABLET BY MOUTH DAILY AT BEDTIME     Psychiatry: Antidepressants - mirtazapine  Failed - 07/04/2023  9:05 AM      Failed - Valid encounter within last 6 months    Recent Outpatient Visits           1 month ago Chronic dementia without behavioral disturbance [facility])   [place] [facility] [place], Megan P, DO   2 months ago Benign hypertensive renal disease   [place] [facility] [place], Megan P, DO   3 months ago Benign hypertensive renal disease   [place] [facility] [place], Megan P, DO   3 months ago Benign hypertensive renal disease   [place] [facility] [place], [place] T, NP              Passed - Completed PHQ-2 or PHQ-9 in the last 360 days

## 2023-07-17 ENCOUNTER — Other Ambulatory Visit: Payer: Self-pay | Admitting: Family Medicine

## 2023-07-18 DIAGNOSIS — H6123 Impacted cerumen, bilateral: Secondary | ICD-10-CM | POA: Diagnosis not present

## 2023-07-18 DIAGNOSIS — H9203 Otalgia, bilateral: Secondary | ICD-10-CM | POA: Diagnosis not present

## 2023-07-18 DIAGNOSIS — H903 Sensorineural hearing loss, bilateral: Secondary | ICD-10-CM | POA: Diagnosis not present

## 2023-07-19 NOTE — Telephone Encounter (Signed)
 Requested Prescriptions  Pending Prescriptions Disp Refills   albuterol  (VENTOLIN  HFA) 108 (90 Base) MCG/ACT inhaler [Pharmacy Med Name: ALBUTEROL  HFA*VENT* 108 (90 BAS Aerosol] 18 g 2    Sig: INHALE 2 PUFFS BY MOUTH EVERY 6 HOURS AS NEEDED FOR WHEEZING OR FOR SHORTNESS OF BREATH     Pulmonology:  Beta Agonists 2 Passed - 07/19/2023  2:14 PM      Passed - Last BP in normal range    BP Readings from Last 1 Encounters:  05/23/23 126/80         Passed - Last Heart Rate in normal range    Pulse Readings from Last 1 Encounters:  05/23/23 96         Passed - Valid encounter within last 12 months    Recent Outpatient Visits           1 month ago Chronic dementia without behavioral disturbance (HCC)   [place] [facility] [place], Megan P, DO   2 months ago Benign hypertensive renal disease   [place] [facility] [place], Megan P, DO   3 months ago Benign hypertensive renal disease   [place] [facility] [place], Megan P, DO   4 months ago Benign hypertensive renal disease   [place] [facility] [place], Melanie DASEN, NP

## 2023-07-20 DIAGNOSIS — S92344A Nondisplaced fracture of fourth metatarsal bone, right foot, initial encounter for closed fracture: Secondary | ICD-10-CM | POA: Diagnosis not present

## 2023-08-17 NOTE — Progress Notes (Signed)
 Pharmacy Quality Measure Review  This patient is appearing on a report for being at risk of failing the adherence measure for cholesterol (statin) medications this calendar year.   Medication: Atorvastatin  40 mg daily Last fill date: 08/12/23 for 60 day supply  Insurance report was not up to date. No action needed at this time.   Jenkins Graces, PharmD PGY1 Pharmacy Resident [phone]

## 2023-08-23 ENCOUNTER — Other Ambulatory Visit: Payer: Self-pay | Admitting: Family Medicine

## 2023-08-24 DIAGNOSIS — S92344A Nondisplaced fracture of fourth metatarsal bone, right foot, initial encounter for closed fracture: Secondary | ICD-10-CM | POA: Diagnosis not present

## 2023-08-24 NOTE — Telephone Encounter (Signed)
 Too soon for refill, LRF 05/23/23 for 90 and 1 RF.  Requested Prescriptions  Pending Prescriptions Disp Refills   atorvastatin  (LIPITOR) 40 MG tablet [Pharmacy Med Name: ATORVASTATIN  40MG  TABLET 40 Tablet] 90 tablet 11    Sig: TAKE 1 TABLET BY MOUTH DAILY     Cardiovascular:  Antilipid - Statins Failed - 08/24/2023  4:14 PM      Failed - Lipid Panel in normal range within the last 12 months    Cholesterol, Total  Date Value Ref Range Status  11/17/2022 124 100 - 199 mg/dL Final   LDL Chol Calc (NIH)  Date Value Ref Range Status  11/17/2022 54 0 - 99 mg/dL Final   HDL  Date Value Ref Range Status  11/17/2022 47 >39 mg/dL Final   Triglycerides  Date Value Ref Range Status  11/17/2022 131 0 - 149 mg/dL Final         Passed - Patient is not pregnant      Passed - Valid encounter within last 12 months    Recent Outpatient Visits           3 months ago Chronic dementia without behavioral disturbance (HCC)   [place] [facility] [place], Megan P, DO   3 months ago Benign hypertensive renal disease   [place] [facility] [place], Megan P, DO   4 months ago Benign hypertensive renal disease   [place] [facility] [place], Megan P, DO   5 months ago Benign hypertensive renal disease   [place] [facility] [place], Melanie DASEN, NP

## 2023-08-25 ENCOUNTER — Ambulatory Visit (INDEPENDENT_AMBULATORY_CARE_PROVIDER_SITE_OTHER): Admitting: Family Medicine

## 2023-08-25 ENCOUNTER — Encounter: Payer: Self-pay | Admitting: Family Medicine

## 2023-08-25 VITALS — BP 128/88 | HR 86 | Temp 98.1°F | Ht 59.0 in | Wt 105.0 lb

## 2023-08-25 DIAGNOSIS — Z794 Long term (current) use of insulin: Secondary | ICD-10-CM | POA: Diagnosis not present

## 2023-08-25 DIAGNOSIS — I129 Hypertensive chronic kidney disease with stage 1 through stage 4 chronic kidney disease, or unspecified chronic kidney disease: Secondary | ICD-10-CM

## 2023-08-25 DIAGNOSIS — J449 Chronic obstructive pulmonary disease, unspecified: Secondary | ICD-10-CM

## 2023-08-25 DIAGNOSIS — N183 Chronic kidney disease, stage 3 unspecified: Secondary | ICD-10-CM

## 2023-08-25 DIAGNOSIS — E1122 Type 2 diabetes mellitus with diabetic chronic kidney disease: Secondary | ICD-10-CM | POA: Diagnosis not present

## 2023-08-25 DIAGNOSIS — F339 Major depressive disorder, recurrent, unspecified: Secondary | ICD-10-CM

## 2023-08-25 MED ORDER — BUDESONIDE-FORMOTEROL FUMARATE 80-4.5 MCG/ACT IN AERO
2.0000 | INHALATION_SPRAY | Freq: Two times a day (BID) | RESPIRATORY_TRACT | 3 refills | Status: AC
Start: 2023-08-25 — End: ?

## 2023-08-25 NOTE — Progress Notes (Unsigned)
 BP 128/88   Pulse 86   Temp 98.1 F (36.7 C) (Oral)   Ht 4' 11 (1.499 m)   Wt 105 lb (47.6 kg)   SpO2 96%   BMI 21.21 kg/m    Subjective:    Patient ID: Angela Neal, female    DOB: Mar 05, 1921, 89 y.o.   MRN: [id number]  HPI: Angela Neal is a 89 y.o. female  Chief Complaint  Patient presents with   Cough    Onset a few months ago   Foot Injury    Fractured right foot 6 weeks ago   Depression   COPD COPD status: uncontrolled Satisfied with current treatment?: no Oxygen use: no Dyspnea frequency: never Cough frequency: multiple times a day Rescue inhaler frequency:  a couple of times a day Limitation of activity: yes Productive cough: no Pneumovax: Up to Date Influenza: Not up to Date  DEPRESSION Mood status: controlled Satisfied with current treatment?: yes Symptom severity: mild  Duration of current treatment : chronic Side effects: no Medication compliance: excellent compliance Psychotherapy/counseling: no  Previous psychiatric medications: Remeron , sertraline  Depressed mood: no Anxious mood: no Anhedonia: no Significant weight loss or gain: no Insomnia: no  Fatigue: yes Feelings of worthlessness or guilt: no Impaired concentration/indecisiveness: no Suicidal ideations: no Hopelessness: no Crying spells: no    08/25/2023    2:09 PM 05/23/2023    2:29 PM 03/28/2023    2:24 PM 02/04/2023    3:19 PM 08/17/2022    1:21 PM  Depression screen PHQ 2/9  Decreased Interest 3 3 0 3 2  Down, Depressed, Hopeless  2 0 0 0  PHQ - 2 Score 3 5 0 3 2  Altered sleeping 2 2 0 1 0  Tired, decreased energy 3 2 0 3 1  Change in appetite  3 0 3 3  Feeling bad or failure about yourself  2 2 0 0 1  Trouble concentrating 3 3 0 3 1  Moving slowly or fidgety/restless 1 2 0 0 0  Suicidal thoughts 1 1 0 0 0  PHQ-9 Score 15 20 0 13 8  Difficult doing work/chores Somewhat difficult Somewhat difficult Not difficult at all  Not difficult at all    DIABETES Hypoglycemic episodes:no Polydipsia/polyuria: no Visual disturbance: no Chest pain: no Paresthesias: no Glucose Monitoring: yes  Accucheck frequency: continuous Taking Insulin ?: yes Blood Pressure Monitoring: daily Retinal Examination: Up to Date Foot Exam: Up to Date Diabetic Education: Completed Pneumovax: Up to Date Influenza: Not up to Date Aspirin: no  HYPERTENSION  Hypertension status: running very high at home and normal here  Satisfied with current treatment? unsure Duration of hypertension: chronic BP monitoring frequency:  daily BP range: see scanned document BP medication side effects:  no Medication compliance: excellent compliance Previous BP meds:losatan, hydrochlorothiazide , hydralazine ,  Aspirin: yes Recurrent headaches: no Visual changes: no Palpitations: no Dyspnea: no Chest pain: no Lower extremity edema: no Dizzy/lightheaded: no    Relevant past medical, surgical, family and social history reviewed and updated as indicated. Interim medical history since our last visit reviewed. Allergies and medications reviewed and updated.  Review of Systems  Constitutional: Negative.   HENT: Negative.    Respiratory:  Positive for cough. Negative for apnea, choking, chest tightness, shortness of breath, wheezing and stridor.   Cardiovascular: Negative.   Musculoskeletal: Negative.   Skin: Negative.   Psychiatric/Behavioral: Negative.      Per HPI unless specifically indicated above     Objective:  BP 128/88   Pulse 86   Temp 98.1 F (36.7 C) (Oral)   Ht 4' 11 (1.499 m)   Wt 105 lb (47.6 kg)   SpO2 96%   BMI 21.21 kg/m   Wt Readings from Last 3 Encounters:  08/25/23 105 lb (47.6 kg)  05/23/23 102 lb 9.6 oz (46.5 kg)  04/29/23 103 lb 8 oz (46.9 kg)    Physical Exam Vitals and nursing note reviewed.  Constitutional:      General: She is not in acute distress.    Appearance: Normal appearance. She is not ill-appearing,  toxic-appearing or diaphoretic.  HENT:     Head: Normocephalic and atraumatic.     Right Ear: Tympanic membrane, ear canal and external ear normal.     Left Ear: Tympanic membrane, ear canal and external ear normal.     Nose: Nose normal. No congestion or rhinorrhea.     Mouth/Throat:     Mouth: Mucous membranes are moist.     Pharynx: Oropharynx is clear. No oropharyngeal exudate or posterior oropharyngeal erythema.  Eyes:     General: No scleral icterus.       Right eye: No discharge.        Left eye: No discharge.     Extraocular Movements: Extraocular movements intact.     Conjunctiva/sclera: Conjunctivae normal.     Pupils: Pupils are equal, round, and reactive to light.  Cardiovascular:     Rate and Rhythm: Normal rate and regular rhythm.     Pulses: Normal pulses.     Heart sounds: Normal heart sounds. No murmur heard.    No friction rub. No gallop.  Pulmonary:     Effort: Pulmonary effort is normal. No respiratory distress.     Breath sounds: Normal breath sounds. No stridor. No wheezing, rhonchi or rales.  Chest:     Chest wall: No tenderness.  Musculoskeletal:        General: Normal range of motion.     Cervical back: Normal range of motion and neck supple.  Skin:    General: Skin is warm and dry.     Capillary Refill: Capillary refill takes less than 2 seconds.     Coloration: Skin is not jaundiced or pale.     Findings: No bruising, erythema, lesion or rash.  Neurological:     General: No focal deficit present.     Mental Status: She is alert and oriented to person, place, and time. Mental status is at baseline.  Psychiatric:        Mood and Affect: Mood normal.        Behavior: Behavior normal.        Thought Content: Thought content normal.        Judgment: Judgment normal.     Results for orders placed or performed in visit on 03/28/23  Urinalysis, Routine w reflex microscopic   Collection Time: 04/04/23  9:30 AM  Result Value Ref Range   Specific  Gravity, UA 1.010 1.005 - 1.030   pH, UA 6.0 5.0 - 7.5   Color, UA Yellow Yellow   Appearance Ur Clear Clear   Leukocytes,UA Negative Negative   Protein,UA Negative Negative/Trace   Glucose, UA Negative Negative   Ketones, UA Negative Negative   RBC, UA Negative Negative   Bilirubin, UA Negative Negative   Urobilinogen, Ur 0.2 0.2 - 1.0 mg/dL   Nitrite, UA Negative Negative   Microscopic Examination Comment   Urine Culture   Collection Time:  04/04/23 10:20 AM   Specimen: Urine   UR  Result Value Ref Range   Urine Culture, Routine Final report    Organism ID, Bacteria Comment       Assessment & Plan:   Problem List Items Addressed This Visit       Respiratory   COPD (chronic obstructive pulmonary disease) (HCC) - Primary   Will start her on symbicort  using spacer. Recheck 3 months. Call with any concerns.       Relevant Medications   budesonide -formoterol  (SYMBICORT ) 80-4.5 MCG/ACT inhaler     Endocrine   Type 2 diabetes mellitus with renal complication [facility])   Doing well with endocrinology. Continue to follow with endocrinology. Call with any concerns. Continue to monitor.         Genitourinary   Benign hypertensive renal disease   Concern about inaccurate readings at home. Will bring in BP cuff for comparison. Continue current regimen for now. Call with ay concerns.         Other   Depression, recurrent (HCC)   Under good control on current regimen. Continue current regimen. Continue to monitor. Call with any concerns. Refills given.          Follow up plan: Return in about 3 months (around 11/25/2023) for physical.

## 2023-08-26 ENCOUNTER — Telehealth: Payer: Self-pay | Admitting: Family Medicine

## 2023-08-26 ENCOUNTER — Encounter: Payer: Self-pay | Admitting: Family Medicine

## 2023-08-26 NOTE — Telephone Encounter (Signed)
 Patients daughter came in with BP cuff and was under the impression that we could check her cuff compared to ours. I explained to her that the patient would need to be here in person and we would need to add her to the Nurse visit schedule to do this and she stated that she didn't want to do that and left.

## 2023-08-26 NOTE — Assessment & Plan Note (Signed)
 Concern about inaccurate readings at home. Will bring in BP cuff for comparison. Continue current regimen for now. Call with ay concerns.

## 2023-08-26 NOTE — Assessment & Plan Note (Signed)
 Under good control on current regimen. Continue current regimen. Continue to monitor. Call with any concerns. Refills given.

## 2023-08-26 NOTE — Assessment & Plan Note (Signed)
 Will start her on symbicort  using spacer. Recheck 3 months. Call with any concerns.

## 2023-08-26 NOTE — Assessment & Plan Note (Signed)
 Doing well with endocrinology. Continue to follow with endocrinology. Call with any concerns. Continue to monitor.

## 2023-09-12 ENCOUNTER — Other Ambulatory Visit: Payer: Self-pay | Admitting: Family Medicine

## 2023-09-13 NOTE — Telephone Encounter (Signed)
 Requested Prescriptions  Refused Prescriptions Disp Refills   sertraline  (ZOLOFT ) 50 MG tablet [Pharmacy Med Name: SERTRALINE  50 MG TABLET 50 Tablet] 135 tablet 11    Sig: TAKE 1 & 1/2 TABLETS BY MOUTH DAILY     Psychiatry:  Antidepressants - SSRI - sertraline  Failed - 09/13/2023  5:50 PM      Failed - AST in normal range and within 360 days    AST  Date Value Ref Range Status  11/17/2022 87 (H) 0 - 40 IU/L Final         Failed - ALT in normal range and within 360 days    ALT  Date Value Ref Range Status  11/17/2022 95 (H) 0 - 32 IU/L Final         Passed - Completed PHQ-2 or PHQ-9 in the last 360 days      Passed - Valid encounter within last 6 months    Recent Outpatient Visits           2 weeks ago Chronic obstructive pulmonary disease, unspecified COPD type (HCC)   [place] [facility] [place], Megan P, DO   3 months ago Chronic dementia without behavioral disturbance [facility])   [place] [facility] [place], Megan P, DO   4 months ago Benign hypertensive renal disease   [place] [facility] [place], Megan P, DO   5 months ago Benign hypertensive renal disease   [place] [facility] [place], Megan P, DO   5 months ago Benign hypertensive renal disease   [place] [facility] [place], Melanie DASEN, NP

## 2023-09-17 ENCOUNTER — Encounter: Payer: Self-pay | Admitting: Emergency Medicine

## 2023-09-17 ENCOUNTER — Other Ambulatory Visit: Payer: Self-pay

## 2023-09-17 ENCOUNTER — Emergency Department: Admission: EM | Admit: 2023-09-17 | Discharge: 2023-09-18 | Disposition: A | Discharge status: Home / Self Care

## 2023-09-17 DIAGNOSIS — E1122 Type 2 diabetes mellitus with diabetic chronic kidney disease: Secondary | ICD-10-CM | POA: Diagnosis not present

## 2023-09-17 DIAGNOSIS — E162 Hypoglycemia, unspecified: Secondary | ICD-10-CM | POA: Diagnosis not present

## 2023-09-17 DIAGNOSIS — R7401 Elevation of levels of liver transaminase levels: Secondary | ICD-10-CM | POA: Diagnosis not present

## 2023-09-17 DIAGNOSIS — E11649 Type 2 diabetes mellitus with hypoglycemia without coma: Secondary | ICD-10-CM | POA: Insufficient documentation

## 2023-09-17 DIAGNOSIS — Z7984 Long term (current) use of oral hypoglycemic drugs: Secondary | ICD-10-CM | POA: Diagnosis not present

## 2023-09-17 DIAGNOSIS — J4489 Other specified chronic obstructive pulmonary disease: Secondary | ICD-10-CM | POA: Diagnosis not present

## 2023-09-17 DIAGNOSIS — I517 Cardiomegaly: Secondary | ICD-10-CM | POA: Diagnosis not present

## 2023-09-17 DIAGNOSIS — J90 Pleural effusion, not elsewhere classified: Secondary | ICD-10-CM | POA: Diagnosis not present

## 2023-09-17 DIAGNOSIS — D72829 Elevated white blood cell count, unspecified: Secondary | ICD-10-CM | POA: Insufficient documentation

## 2023-09-17 DIAGNOSIS — N189 Chronic kidney disease, unspecified: Secondary | ICD-10-CM | POA: Insufficient documentation

## 2023-09-17 DIAGNOSIS — J9811 Atelectasis: Secondary | ICD-10-CM | POA: Diagnosis not present

## 2023-09-17 DIAGNOSIS — I129 Hypertensive chronic kidney disease with stage 1 through stage 4 chronic kidney disease, or unspecified chronic kidney disease: Secondary | ICD-10-CM | POA: Insufficient documentation

## 2023-09-17 DIAGNOSIS — R000 Tachycardia, unspecified: Secondary | ICD-10-CM | POA: Insufficient documentation

## 2023-09-17 DIAGNOSIS — E119 Type 2 diabetes mellitus without complications: Secondary | ICD-10-CM | POA: Diagnosis not present

## 2023-09-17 LAB — CBC WITH DIFFERENTIAL/PLATELET
Abs Immature Granulocytes: 0.08 K/uL — ABNORMAL HIGH (ref 0.00–0.07)
Basophils Absolute: 0.1 K/uL (ref 0.0–0.1)
Basophils Relative: 1 %
Eosinophils Absolute: 0.2 K/uL (ref 0.0–0.5)
Eosinophils Relative: 2 %
HCT: 36.5 % (ref 36.0–46.0)
Hemoglobin: 12.1 g/dL (ref 12.0–15.0)
Immature Granulocytes: 1 %
Lymphocytes Relative: 8 %
Lymphs Abs: 1 K/uL (ref 0.7–4.0)
MCH: 31.8 pg (ref 26.0–34.0)
MCHC: 33.2 g/dL (ref 30.0–36.0)
MCV: 96.1 fL (ref 80.0–100.0)
Monocytes Absolute: 1.4 K/uL — ABNORMAL HIGH (ref 0.1–1.0)
Monocytes Relative: 10 %
Neutro Abs: 10.5 K/uL — ABNORMAL HIGH (ref 1.7–7.7)
Neutrophils Relative %: 78 %
Platelets: 179 K/uL (ref 150–400)
RBC: 3.8 MIL/uL — ABNORMAL LOW (ref 3.87–5.11)
RDW: 14.4 % (ref 11.5–15.5)
WBC: 13.3 K/uL — ABNORMAL HIGH (ref 4.0–10.5)
nRBC: 0 % (ref 0.0–0.2)

## 2023-09-17 LAB — COMPREHENSIVE METABOLIC PANEL WITH GFR
ALT: 55 U/L — ABNORMAL HIGH (ref 0–44)
AST: 50 U/L — ABNORMAL HIGH (ref 15–41)
Albumin: 3.9 g/dL (ref 3.5–5.0)
Alkaline Phosphatase: 88 U/L (ref 38–126)
Anion gap: 14 (ref 5–15)
BUN: 33 mg/dL — ABNORMAL HIGH (ref 8–23)
CO2: 24 mmol/L (ref 22–32)
Calcium: 9.2 mg/dL (ref 8.9–10.3)
Chloride: 97 mmol/L — ABNORMAL LOW (ref 98–111)
Creatinine, Ser: 1.33 mg/dL — ABNORMAL HIGH (ref 0.44–1.00)
GFR, Estimated: 36 mL/min — ABNORMAL LOW (ref >60)
Glucose, Bld: 194 mg/dL — ABNORMAL HIGH (ref 70–99)
Potassium: 4 mmol/L (ref 3.5–5.1)
Sodium: 135 mmol/L (ref 135–145)
Total Bilirubin: 0.6 mg/dL (ref 0.0–1.2)
Total Protein: 7.2 g/dL (ref 6.5–8.1)

## 2023-09-17 LAB — CBG MONITORING, ED: Glucose-Capillary: 180 mg/dL — ABNORMAL HIGH (ref 70–99)

## 2023-09-17 NOTE — ED Notes (Signed)
 FN Note: BIB [facility] from home for low BGL. 66 on scene. Pt was given 30 G of oral glucose and improved to 77. Pt has  D10  running. Pt is caox2 at baseline per family.

## 2023-09-17 NOTE — ED Triage Notes (Signed)
 Patient BIB [facility] for hypoglycemia.  Per EMS, patient reports cbg normally between 120-200, daughter checked it at home and found it to be 60, and patient was confused.  EMS able to give oral glucose, patient still confused, giving bolus of D10 upon arrival.  Patient CAOx4, hard of hearing.    20 L AC

## 2023-09-18 ENCOUNTER — Emergency Department

## 2023-09-18 DIAGNOSIS — J90 Pleural effusion, not elsewhere classified: Secondary | ICD-10-CM | POA: Diagnosis not present

## 2023-09-18 DIAGNOSIS — I517 Cardiomegaly: Secondary | ICD-10-CM | POA: Diagnosis not present

## 2023-09-18 DIAGNOSIS — E162 Hypoglycemia, unspecified: Secondary | ICD-10-CM | POA: Diagnosis not present

## 2023-09-18 DIAGNOSIS — J9811 Atelectasis: Secondary | ICD-10-CM | POA: Diagnosis not present

## 2023-09-18 LAB — URINALYSIS, COMPLETE (UACMP) WITH MICROSCOPIC
Bacteria, UA: NONE SEEN
Bilirubin Urine: NEGATIVE
Glucose, UA: 150 mg/dL — AB
Hgb urine dipstick: NEGATIVE
Ketones, ur: NEGATIVE mg/dL
Leukocytes,Ua: NEGATIVE
Nitrite: NEGATIVE
Protein, ur: NEGATIVE mg/dL
Specific Gravity, Urine: 1.006 (ref 1.005–1.030)
Squamous Epithelial / HPF: 0 /HPF (ref 0–5)
WBC, UA: 0 WBC/hpf (ref 0–5)
pH: 6 (ref 5.0–8.0)

## 2023-09-18 LAB — CBG MONITORING, ED: Glucose-Capillary: 274 mg/dL — ABNORMAL HIGH (ref 70–99)

## 2023-09-18 LAB — RESP PANEL BY RT-PCR (RSV, FLU A&B, COVID)  RVPGX2
Influenza A by PCR: NEGATIVE
Influenza B by PCR: NEGATIVE
Resp Syncytial Virus by PCR: NEGATIVE
SARS Coronavirus 2 by RT PCR: NEGATIVE

## 2023-09-18 MED ORDER — SODIUM CHLORIDE 0.9 % IV BOLUS
500.0000 mL | Freq: Once | INTRAVENOUS | Status: AC
  Administered 2023-09-18: 500 mL via INTRAVENOUS

## 2023-09-18 NOTE — Discharge Instructions (Signed)
 Your mother's evaluation in the emergency department was overall reassuring.  I suspect she likely had a low glucose in the setting of poor p.o. intake after her NovoLog  this evening.  Please continue with her medications as prescribed and keep a close eye on her blood sugars.  Please follow-up with her primary care provider for reevaluation, and return to the emergency department with any new or worsening symptoms.  Hope you all enjoy her 89th birthday celebration this Halloween! Take care.

## 2023-09-18 NOTE — ED Provider Notes (Signed)
 [facility] Provider Note    Event Date/Time   First MD Initiated Contact with Patient 09/18/23 0015     (approximate)   History   Hypoglycemia  Patient BIB [facility] for hypoglycemia.  Per EMS, patient reports cbg normally between 120-200, daughter checked it at home and found it to be 60, and patient was confused.  EMS able to give oral glucose, patient still confused, giving bolus of D10 upon arrival.  Patient CAOx4, hard of hearing.    20 L AC   HPI Angela Neal is a 89 y.o. female PMH asthma/COPD, hypertension, CKD, DM2 presents for evaluation of hypoglycemia - Patient was reportedly was interactive than usual around 8 PM today.  Daughter checked her blood sugar and it was about 64.  Give her small amount of peanut butter and milk and recheck her blood sugar, it was about the same so she called an ambulance. - Ambulance gave D10 and mentation improved. - No recent infectious symptoms - No recent medication changes - Did get her NovoLog  around 5 PM and did not eat much for dinner.  Does have variable p.o. intake at baseline. - Took her Jardiance this morning.  Daughter manages all medications, states no possibility that she had an accidental overdose. - Notes patient is at her mental status baseline - Had 1 similar episode in July, treated by paramedics, did not come to ED at that time  Per chart review, last seen by endocrinology on 06/21/2023.  Was on Tresiba 8 units nightly as well as NovoLog  3 times daily AC. Is also on Jardiance.       Physical Exam   Triage Vital Signs: ED Triage Vitals  Encounter Vitals Group     BP 09/17/23 2214 (!) 216/80     Girls Systolic BP Percentile --      Girls Diastolic BP Percentile --      Boys Systolic BP Percentile --      Boys Diastolic BP Percentile --      Pulse Rate 09/17/23 2214 (!) 101     Resp 09/17/23 2214 20     Temp 09/17/23 2214 98.2 F (36.8 C)     Temp src --      SpO2 09/17/23  2214 100 %     Weight 09/17/23 2211 105 lb 13.1 oz (48 kg)     Height --      Head Circumference --      Peak Flow --      Pain Score 09/17/23 2211 0     Pain Loc --      Pain Education --      Exclude from Growth Chart --     Most recent vital signs: Vitals:   09/18/23 0100 09/18/23 0330  BP: (!) 186/77 (!) 208/89  Pulse: (!) 111 (!) 103  Resp: 19 18  Temp:  97.9 F (36.6 C)  SpO2: 98% 99%     General: Awake, no distress.  CV:  Good peripheral perfusion.  Mild tachycardia, regular rhythm, RP 2+ Resp:  Normal effort. CTAB, mild expiratory wheezing throughout Abd:  No distention. Nontender to deep palpation throughout Neuro:  Alert, interactive, responds to questions appropriately.  At mental status baseline per family bedside.   ED Results / Procedures / Treatments   Labs (all labs ordered are listed, but only abnormal results are displayed) Labs Reviewed  CBC WITH DIFFERENTIAL/PLATELET - Abnormal; Notable for the following components:  Result Value   WBC 13.3 (*)    RBC 3.80 (*)    Neutro Abs 10.5 (*)    Monocytes Absolute 1.4 (*)    Abs Immature Granulocytes 0.08 (*)    All other components within normal limits  COMPREHENSIVE METABOLIC PANEL WITH GFR - Abnormal; Notable for the following components:   Chloride 97 (*)    Glucose, Bld 194 (*)    BUN 33 (*)    Creatinine, Ser 1.33 (*)    AST 50 (*)    ALT 55 (*)    GFR, Estimated 36 (*)    All other components within normal limits  URINALYSIS, COMPLETE (UACMP) WITH MICROSCOPIC - Abnormal; Notable for the following components:   Color, Urine STRAW (*)    APPearance CLEAR (*)    Glucose, UA 150 (*)    All other components within normal limits  CBG MONITORING, ED - Abnormal; Notable for the following components:   Glucose-Capillary 180 (*)    All other components within normal limits  CBG MONITORING, ED - Abnormal; Notable for the following components:   Glucose-Capillary 274 (*)    All other components  within normal limits  RESP PANEL BY RT-PCR (RSV, FLU A&B, COVID)  [id number]     EKG  N/a   RADIOLOGY Radiology interpreted by myself and radiology report reviewed.  No acute pathology identified.    PROCEDURES:  Critical Care performed: No  Procedures   MEDICATIONS ORDERED IN ED: Medications  sodium chloride  0.9 % bolus 500 mL (0 mLs Intravenous Stopped 09/18/23 0200)     IMPRESSION / MDM / ASSESSMENT AND PLAN / ED COURSE  I reviewed the triage vital signs and the nursing notes.                              DDX/MDM/AP: Differential diagnosis includes, but is not limited to, likely episode of mild hypoglycemia in the setting of poor p.o. intake after having received her usual NovoLog  insulin  prior to meal.  Do not suspect hypoglycemia secondary to Jardiance at this time given history of consider possibility of worsened metabolism.  Also consider possibility of underlying infection including UTI or pneumonia or viral syndrome including COVID-19 or influenza--will screen.  Will monitor blood sugar here in emergency department as well. Mild tachycardia, suspect mild dehydration.  Is hypertensive here though daughter states she is regularly hypertensive to similar levels, no related symptoms.  Plan: - Labs - Chest x-ray - IV fluid - reassess  Patient's presentation is most consistent with acute presentation with potential threat to life or bodily function.  The patient is on the cardiac monitor to evaluate for evidence of arrhythmia and/or significant heart rate changes.  ED course below.  Workup unremarkable.  No recurrent hypoglycemia.  No evidence of underlying infection.  Suspect hypoglycemia in the setting of poor p.o. intake after having taken her NovoLog .  Discharged home in care of her daughter.  Plan for close PMD follow-up.  ED return precautions in place.  Patient agrees with plan.  Clinical Course as of 09/18/23 0737  Sun Sep 18, 2023  0046 CBC with mild  leukocytosis, no anemia [MM]  0046 CMP with mild bump in creatinine, otherwise unremarkable.  Very mild transaminitis that is downtrending from prior.  WBC normal.  No tenderness to be palpation of the abdomen. [MM]  0103 Chest x-ray interpreted by myself radiology report reviewed, no acute pathology identified  IMPRESSION: Bibasilar  atelectasis with small left effusion.   [MM]  0311 Urinalysis reviewed, unremarkable [MM]  0320 Patient reevaluated, remains well-appearing here.  Still hypertensive which daughter says patient is at baseline.  Fortunately asymptomatic at this time.  Mild tachycardia which daughter says is also baseline for her, does not appear to have heart rate in the high 90s on recent evals as well.  Mild leukocytosis no evidence of underlying infection at this time.  No recurrence of her hypoglycemia as well.  No clinical concern for oral antihyperglycemic overdose, suspect was secondary to poor p.o. intake after short acting insulin  around 5 PM.  While outside of window, tolerating p.o., stable for discharge home.  Family will continue to monitor closely at home.  Plan for PMD follow-up.  ED return precautions in place. [MM]    Clinical Course User Index [MM] Clarine Ozell LABOR, MD     FINAL CLINICAL IMPRESSION(S) / ED DIAGNOSES   Final diagnoses:  Hypoglycemia     Rx / DC Orders   ED Discharge Orders     None        Note:  This document was prepared using Dragon voice recognition software and may include unintentional dictation errors.   Clarine Ozell LABOR, MD 09/18/23 [id number]

## 2023-10-04 DIAGNOSIS — M810 Age-related osteoporosis without current pathological fracture: Secondary | ICD-10-CM | POA: Diagnosis not present

## 2023-10-04 DIAGNOSIS — E113313 Type 2 diabetes mellitus with moderate nonproliferative diabetic retinopathy with macular edema, bilateral: Secondary | ICD-10-CM | POA: Diagnosis not present

## 2023-10-04 DIAGNOSIS — Z794 Long term (current) use of insulin: Secondary | ICD-10-CM | POA: Diagnosis not present

## 2023-10-11 ENCOUNTER — Other Ambulatory Visit: Payer: Self-pay | Admitting: Family Medicine

## 2023-10-11 DIAGNOSIS — M810 Age-related osteoporosis without current pathological fracture: Secondary | ICD-10-CM | POA: Diagnosis not present

## 2023-10-11 DIAGNOSIS — I10 Essential (primary) hypertension: Secondary | ICD-10-CM | POA: Diagnosis not present

## 2023-10-11 DIAGNOSIS — E1159 Type 2 diabetes mellitus with other circulatory complications: Secondary | ICD-10-CM | POA: Diagnosis not present

## 2023-10-11 DIAGNOSIS — E1129 Type 2 diabetes mellitus with other diabetic kidney complication: Secondary | ICD-10-CM | POA: Diagnosis not present

## 2023-10-11 DIAGNOSIS — E113313 Type 2 diabetes mellitus with moderate nonproliferative diabetic retinopathy with macular edema, bilateral: Secondary | ICD-10-CM | POA: Diagnosis not present

## 2023-10-11 DIAGNOSIS — Z794 Long term (current) use of insulin: Secondary | ICD-10-CM | POA: Diagnosis not present

## 2023-10-11 DIAGNOSIS — N1831 Chronic kidney disease, stage 3a: Secondary | ICD-10-CM | POA: Diagnosis not present

## 2023-10-11 DIAGNOSIS — E1122 Type 2 diabetes mellitus with diabetic chronic kidney disease: Secondary | ICD-10-CM | POA: Diagnosis not present

## 2023-10-11 DIAGNOSIS — R809 Proteinuria, unspecified: Secondary | ICD-10-CM | POA: Diagnosis not present

## 2023-10-12 NOTE — Progress Notes (Signed)
   10/12/2023  Patient ID: Angela Neal, female   DOB: [birth year], 89 y.o.   MRN: [id number]  This patient is appearing on a report for being at risk of failing the adherence measure for cholesterol (statin) medications this calendar year.   Medication: atorvastatin  40 mg tablets Last fill date: 6/8 for 90 day supply and 8/8 for 90 day supply  Insurance report was not up to date. No action needed at this time.   Makaylin Carlo C. Kazaria Gaertner [facility] PharmD Candidate Class of [id number]

## 2023-10-13 ENCOUNTER — Other Ambulatory Visit: Payer: Self-pay | Admitting: Family Medicine

## 2023-10-13 NOTE — Telephone Encounter (Signed)
 Requested Prescriptions  Pending Prescriptions Disp Refills   hydrALAZINE  (APRESOLINE ) 10 MG tablet [Pharmacy Med Name: HYDRALAZINE  10 MG TABLET 10 Tablet] 90 tablet 11    Sig: TAKE THREE (3) TABLETS BY MOUTH TWICE DAILY     Cardiovascular:  Vasodilators Failed - 10/13/2023  1:38 PM      Failed - RBC in normal range and within 360 days    RBC  Date Value Ref Range Status  09/17/2023 3.80 (L) 3.87 - 5.11 MIL/uL Final         Failed - WBC in normal range and within 360 days    WBC  Date Value Ref Range Status  09/17/2023 13.3 (H) 4.0 - 10.5 K/uL Final         Failed - ANA Screen, Ifa, Serum in normal range and within 360 days    No results found for: ANA, ANATITER, LABANTI       Failed - Last BP in normal range    BP Readings from Last 1 Encounters:  09/18/23 (!) 208/89         Passed - HCT in normal range and within 360 days    HCT  Date Value Ref Range Status  09/17/2023 36.5 36.0 - 46.0 % Final   Hematocrit  Date Value Ref Range Status  11/17/2022 38.4 34.0 - 46.6 % Final         Passed - HGB in normal range and within 360 days    Hemoglobin  Date Value Ref Range Status  09/17/2023 12.1 12.0 - 15.0 g/dL Final  88/86/7975 87.7 11.1 - 15.9 g/dL Final         Passed - PLT in normal range and within 360 days    Platelets  Date Value Ref Range Status  09/17/2023 179 150 - 400 K/uL Final  11/17/2022 141 (L) 150 - 450 x10E3/uL Final         Passed - Valid encounter within last 12 months    Recent Outpatient Visits           1 month ago Chronic obstructive pulmonary disease, unspecified COPD type (HCC)   [place] [facility] [place], Megan P, DO   4 months ago Chronic dementia without behavioral disturbance (HCC)   [facility] [place], Megan P, DO   5 months ago Benign hypertensive renal disease   [place] [facility] [place], Megan P, DO   6 months ago Benign hypertensive renal disease   [place]  [facility] [place], Megan P, DO   6 months ago Benign hypertensive renal disease   [place] [facility] [place], Louise T, NP               albuterol  (VENTOLIN  HFA) 108 (90 Base) MCG/ACT inhaler [Pharmacy Med Name: ALBUTEROL  HFA*VENT* 108 (90 BAS Aerosol] 18 g 11    Sig: INHALE TWO (2) PUFFS BY MOUTH EVERY 6 HOURS AS NEEDED FOR WHEEZING OR SHORTNESS OF BREATH     Pulmonology:  Beta Agonists 2 Failed - 10/13/2023  1:38 PM      Failed - Last BP in normal range    BP Readings from Last 1 Encounters:  09/18/23 (!) 208/89         Passed - Last Heart Rate in normal range    Pulse Readings from Last 1 Encounters:  09/18/23 (!) 103         Passed - Valid encounter within last 12 months  Recent Outpatient Visits           1 month ago Chronic obstructive pulmonary disease, unspecified COPD type (HCC)   [place] [facility] [place], Megan P, DO   4 months ago Chronic dementia without behavioral disturbance [facility])   [place] [facility] [place], Megan P, DO   5 months ago Benign hypertensive renal disease   [place] [facility] [place], Megan P, DO   6 months ago Benign hypertensive renal disease   [place] [facility] [place], Megan P, DO   6 months ago Benign hypertensive renal disease   [place] [facility] [place], Melanie DASEN, NP

## 2023-10-14 NOTE — Telephone Encounter (Signed)
 Requested Prescriptions  Pending Prescriptions Disp Refills   losartan  (COZAAR ) 100 MG tablet [Pharmacy Med Name: LOSARTAN  100MG  TAB 100 Tablet] 90 tablet 1    Sig: TAKE 1 TABLET BY MOUTH ONCE DAILY     Cardiovascular:  Angiotensin Receptor Blockers Failed - 10/14/2023  3:57 PM      Failed - Cr in normal range and within 180 days    Creatinine  Date Value Ref Range Status  04/26/2014 0.82 mg/dL Final    Comment:    9.55-8.99 NOTE: New Reference Range  03/12/14    Creatinine, Ser  Date Value Ref Range Status  09/17/2023 1.33 (H) 0.44 - 1.00 mg/dL Final         Failed - Last BP in normal range    BP Readings from Last 1 Encounters:  09/18/23 (!) 208/89         Passed - K in normal range and within 180 days    Potassium  Date Value Ref Range Status  09/17/2023 4.0 3.5 - 5.1 mmol/L Final  04/26/2014 3.8 mmol/L Final    Comment:    3.5-5.1 NOTE: New Reference Range  03/12/14          Passed - Patient is not pregnant      Passed - Valid encounter within last 6 months    Recent Outpatient Visits           1 month ago Chronic obstructive pulmonary disease, unspecified COPD type (HCC)   [place] [facility] [place], Megan P, DO   4 months ago Chronic dementia without behavioral disturbance [facility])   [place] [facility] [place], Megan P, DO   5 months ago Benign hypertensive renal disease   [place] [facility] [place], Megan P, DO   6 months ago Benign hypertensive renal disease   [place] [facility] [place], Megan P, DO   7 months ago Benign hypertensive renal disease   [place] [facility] [place], Melanie DASEN, NP

## 2023-10-18 DIAGNOSIS — H6123 Impacted cerumen, bilateral: Secondary | ICD-10-CM | POA: Diagnosis not present

## 2023-10-18 DIAGNOSIS — J300 Vasomotor rhinitis: Secondary | ICD-10-CM | POA: Diagnosis not present

## 2023-10-25 NOTE — Progress Notes (Signed)
 Pt returns for blood pressure check.  Checked in R arm with pediatric cuff due to size of pt's arm.   BP: 210/88  Daughter brought in BP log for past 14 days. Placed on providers desk for review.

## 2023-10-30 ENCOUNTER — Emergency Department

## 2023-10-30 ENCOUNTER — Emergency Department: Admission: EM | Admit: 2023-10-30 | Discharge: 2023-10-31 | Disposition: A

## 2023-10-30 DIAGNOSIS — N189 Chronic kidney disease, unspecified: Secondary | ICD-10-CM | POA: Diagnosis not present

## 2023-10-30 DIAGNOSIS — I517 Cardiomegaly: Secondary | ICD-10-CM | POA: Diagnosis not present

## 2023-10-30 DIAGNOSIS — F0390 Unspecified dementia without behavioral disturbance: Secondary | ICD-10-CM | POA: Insufficient documentation

## 2023-10-30 DIAGNOSIS — D72829 Elevated white blood cell count, unspecified: Secondary | ICD-10-CM | POA: Insufficient documentation

## 2023-10-30 DIAGNOSIS — E1122 Type 2 diabetes mellitus with diabetic chronic kidney disease: Secondary | ICD-10-CM | POA: Diagnosis not present

## 2023-10-30 DIAGNOSIS — R7989 Other specified abnormal findings of blood chemistry: Secondary | ICD-10-CM | POA: Diagnosis not present

## 2023-10-30 DIAGNOSIS — I700 Atherosclerosis of aorta: Secondary | ICD-10-CM | POA: Diagnosis not present

## 2023-10-30 DIAGNOSIS — J441 Chronic obstructive pulmonary disease with (acute) exacerbation: Secondary | ICD-10-CM | POA: Diagnosis not present

## 2023-10-30 DIAGNOSIS — R918 Other nonspecific abnormal finding of lung field: Secondary | ICD-10-CM | POA: Diagnosis not present

## 2023-10-30 DIAGNOSIS — R0602 Shortness of breath: Secondary | ICD-10-CM | POA: Diagnosis not present

## 2023-10-30 LAB — COMPREHENSIVE METABOLIC PANEL WITH GFR
ALT: 46 U/L — ABNORMAL HIGH (ref 0–44)
AST: 36 U/L (ref 15–41)
Albumin: 4.2 g/dL (ref 3.5–5.0)
Alkaline Phosphatase: 78 U/L (ref 38–126)
Anion gap: 11 (ref 5–15)
BUN: 36 mg/dL — ABNORMAL HIGH (ref 8–23)
CO2: 22 mmol/L (ref 22–32)
Calcium: 8.8 mg/dL — ABNORMAL LOW (ref 8.9–10.3)
Chloride: 100 mmol/L (ref 98–111)
Creatinine, Ser: 1.04 mg/dL — ABNORMAL HIGH (ref 0.44–1.00)
GFR, Estimated: 48 mL/min — ABNORMAL LOW (ref >60)
Glucose, Bld: 157 mg/dL — ABNORMAL HIGH (ref 70–99)
Potassium: 4.1 mmol/L (ref 3.5–5.1)
Sodium: 133 mmol/L — ABNORMAL LOW (ref 135–145)
Total Bilirubin: 0.8 mg/dL (ref 0.0–1.2)
Total Protein: 7.4 g/dL (ref 6.5–8.1)

## 2023-10-30 LAB — URINALYSIS, COMPLETE (UACMP) WITH MICROSCOPIC
Bacteria, UA: NONE SEEN
Bilirubin Urine: NEGATIVE
Glucose, UA: NEGATIVE mg/dL
Hgb urine dipstick: NEGATIVE
Ketones, ur: NEGATIVE mg/dL
Leukocytes,Ua: NEGATIVE
Nitrite: NEGATIVE
Protein, ur: NEGATIVE mg/dL
RBC / HPF: 0 RBC/hpf (ref 0–5)
Specific Gravity, Urine: 1.008 (ref 1.005–1.030)
Squamous Epithelial / HPF: 0 /HPF (ref 0–5)
pH: 5 (ref 5.0–8.0)

## 2023-10-30 LAB — CBC WITH DIFFERENTIAL/PLATELET
Abs Immature Granulocytes: 0.05 K/uL (ref 0.00–0.07)
Basophils Absolute: 0.1 K/uL (ref 0.0–0.1)
Basophils Relative: 1 %
Eosinophils Absolute: 0.3 K/uL (ref 0.0–0.5)
Eosinophils Relative: 3 %
HCT: 36.7 % (ref 36.0–46.0)
Hemoglobin: 12.2 g/dL (ref 12.0–15.0)
Immature Granulocytes: 1 %
Lymphocytes Relative: 17 %
Lymphs Abs: 1.8 K/uL (ref 0.7–4.0)
MCH: 31.8 pg (ref 26.0–34.0)
MCHC: 33.2 g/dL (ref 30.0–36.0)
MCV: 95.6 fL (ref 80.0–100.0)
Monocytes Absolute: 1.2 K/uL — ABNORMAL HIGH (ref 0.1–1.0)
Monocytes Relative: 11 %
Neutro Abs: 7.3 K/uL (ref 1.7–7.7)
Neutrophils Relative %: 67 %
Platelets: 188 K/uL (ref 150–400)
RBC: 3.84 MIL/uL — ABNORMAL LOW (ref 3.87–5.11)
RDW: 13.4 % (ref 11.5–15.5)
WBC: 10.7 K/uL — ABNORMAL HIGH (ref 4.0–10.5)
nRBC: 0 % (ref 0.0–0.2)

## 2023-10-30 LAB — RESP PANEL BY RT-PCR (RSV, FLU A&B, COVID)  RVPGX2
Influenza A by PCR: NEGATIVE
Influenza B by PCR: NEGATIVE
Resp Syncytial Virus by PCR: NEGATIVE
SARS Coronavirus 2 by RT PCR: NEGATIVE

## 2023-10-30 LAB — BLOOD GAS, VENOUS

## 2023-10-30 LAB — BRAIN NATRIURETIC PEPTIDE: B Natriuretic Peptide: 603.4 pg/mL — ABNORMAL HIGH (ref 0.0–100.0)

## 2023-10-30 LAB — LIPASE, BLOOD: Lipase: 34 U/L (ref 11–51)

## 2023-10-30 MED ORDER — PREDNISONE 20 MG PO TABS: 20.0000 mg | ORAL_TABLET | Freq: Every day | ORAL | 0 refills | Status: AC

## 2023-10-30 MED ORDER — ALBUTEROL SULFATE (2.5 MG/3ML) 0.083% IN NEBU
INHALATION_SOLUTION | RESPIRATORY_TRACT | Status: AC
  Filled 2023-10-30: qty 3

## 2023-10-30 MED ORDER — FUROSEMIDE 10 MG/ML IJ SOLN
20.0000 mg | Freq: Once | INTRAMUSCULAR | Status: AC
  Administered 2023-10-30: 20 mg via INTRAVENOUS
  Filled 2023-10-30: qty 4

## 2023-10-30 MED ORDER — ALBUTEROL SULFATE 0.63 MG/3ML IN NEBU
1.0000 | INHALATION_SOLUTION | Freq: Four times a day (QID) | RESPIRATORY_TRACT | 12 refills | Status: DC | PRN
Start: 2023-10-30 — End: 2023-11-14

## 2023-10-30 NOTE — Discharge Instructions (Signed)
 The next 24 hours use a nebulizing treatment only inhaler every 4-6 hours.  Your next dose of steroids will be due tomorrow morning.  Follow-up with your primary care physician.  Return with any acutely worsening symptoms -- RETURN PRECAUTIONS & AFTERCARE: (ENGLISH) RETURN PRECAUTIONS: Return immediately to the emergency department or see/call your doctor if you feel worse, weak or have changes in speech or vision, are short of breath, have fever, vomiting, pain, bleeding or dark stool, trouble urinating or any new issues. Return here or see/call your doctor if not improving as expected for your suspected condition. FOLLOW-UP CARE: Call your doctor and/or any doctors we referred you to for more advice and to make an appointment. Do this today, tomorrow or after the weekend. Some doctors only take PPO insurance so if you have HMO insurance you may want to contact your HMO or your regular doctor for referral to a specialist within your plan. Either way tell the doctor's office that it was a referral from the emergency department so you get the soonest possible appointment.  YOUR TEST RESULTS: Take result reports of any blood or urine tests, imaging tests and EKG's to your doctor and any referral doctor. Have any abnormal tests repeated. Your doctor or a referral doctor can let you know when this should be done. Also make sure your doctor contacts this hospital to get any test results that are not currently available such as cultures or special tests for infection and final imaging reports, which are often not available at the time you leave the ER but which may list additional important findings that are not documented on the preliminary report. BLOOD PRESSURE: If your blood pressure was greater than 120/80 have your blood pressure rechecked within 1 to 2 weeks. MEDICATION SIDE EFFECTS: Do not drive, walk, bike, take the bus, etc. if you have received or are being prescribed any sedating medications such as  those for pain or anxiety or certain antihistamines like Benadryl. If you have been give one of these here get a taxi home or have a friend drive you home. Ask your pharmacist to counsel you on potential side effects of any new medication

## 2023-10-30 NOTE — ED Provider Notes (Signed)
 [facility] Provider Note    Event Date/Time   First MD Initiated Contact with Patient 10/30/23 2007     (approximate)   History   Shortness of Breath (Presents due to dyspnea from home. Hx of COPD, not O2 dependent. RA sat 96%.)   HPI  Angela Neal is a 89 y.o. female with past medical history of COPD not on oxygen, type 2 diabetes, CKD, depression, hyperlipidemia, chronic dementia who presents from home with an episode of weakness and shortness of breath.  History is initially obtained by EMS who brought the patient for call out of shortness of breath.  On their arrival patient was on room air satting 96% and well-appearing.  They gave her a breathing treatment and 125 mg of Solu-Medrol IV and route.  Patient reports her shortness of breath has improved denies any chest pain and abdominal pain or changes in urinary or bowel habits but does not answer every question correctly  Further history is obtained by the patient's daughter who arrives at bedside afterwards.  Patient had some difficulty with taking good p.o. today and did have a low glucose of 75 and became symptomatic per the daughter.  She gave her some orange juice and she improved but stated that she had short of breath and EMS was called.  Of note, the patient's daughter has a nebulizing machine but does not have any refills for such and the patient has not had a breathing treatment in over several weeks.  The patient is at her baseline mental status per the daughter at bedside.  She is frustrated that the patient has never been told to have regular scheduled breathing treatments or reason why she is intermittently short of breath      Physical Exam   Triage Vital Signs: ED Triage Vitals  Encounter Vitals Group     BP 10/30/23 2025 (!) 204/72     Girls Systolic BP Percentile --      Girls Diastolic BP Percentile --      Boys Systolic BP Percentile --      Boys Diastolic BP Percentile --       Pulse Rate 10/30/23 2025 86     Resp 10/30/23 2025 (!) 26     Temp 10/30/23 2100 98.3 F (36.8 C)     Temp Source 10/30/23 2100 Oral     SpO2 10/30/23 2023 96 %     Weight 10/30/23 2100 104 lb 9.6 oz (47.4 kg)     Height 10/30/23 2100 4' 10 (1.473 m)     Head Circumference --      Peak Flow --      Pain Score 10/30/23 2027 0     Pain Loc --      Pain Education --      Exclude from Growth Chart --     Most recent vital signs: Vitals:   10/30/23 2130 10/30/23 2141  BP: (!) 151/60   Pulse: 93   Resp: 20   Temp:    SpO2: 94% 96%    Nursing Triage Note reviewed. Vital signs reviewed and patients oxygen saturation is normoxic  General: Patient is well nourished, well developed, awake and alert, resting comfortably in no acute distress Head: Normocephalic and atraumatic Eyes: Normal inspection, extraocular muscles intact, no conjunctival pallor Ear, nose, throat: Normal external exam Neck: Normal range of motion Respiratory: Patient is in no respiratory distress, lungs with scattered wheezes throughout Cardiovascular: Patient is not tachycardic, RRR  without murmur appreciated GI: Abd SNT with no guarding or rebound  Back: Normal inspection of the back with good strength and range of motion throughout all ext Extremities: pulses intact with good cap refills, no LE pitting edema or calf tenderness Neuro: The patient is alert and oriented to person, place, not to time appropriately conversive, with 5/5 bilat UE/LE strength, no gross motor or sensory defects noted. Coordination appears to be adequate. Skin: Warm, dry, and intact Psych: normal mood and affect, no SI or HI  ED Results / Procedures / Treatments   Labs (all labs ordered are listed, but only abnormal results are displayed) Labs Reviewed  CBC WITH DIFFERENTIAL/PLATELET - Abnormal; Notable for the following components:      Result Value   WBC 10.7 (*)    RBC 3.84 (*)    Monocytes Absolute 1.2 (*)    All other  components within normal limits  COMPREHENSIVE METABOLIC PANEL WITH GFR - Abnormal; Notable for the following components:   Sodium 133 (*)    Glucose, Bld 157 (*)    BUN 36 (*)    Creatinine, Ser 1.04 (*)    Calcium  8.8 (*)    ALT 46 (*)    GFR, Estimated 48 (*)    All other components within normal limits  BRAIN NATRIURETIC PEPTIDE - Abnormal; Notable for the following components:   B Natriuretic Peptide 603.4 (*)    All other components within normal limits  BLOOD GAS, VENOUS - Abnormal; Notable for the following components:   pCO2, Ven 41 (*)    pO2, Ven <31 (*)    All other components within normal limits  URINALYSIS, COMPLETE (UACMP) WITH MICROSCOPIC - Abnormal; Notable for the following components:   Color, Urine STRAW (*)    APPearance CLEAR (*)    All other components within normal limits  RESP PANEL BY RT-PCR (RSV, FLU A&B, COVID)  RVPGX2  LIPASE, BLOOD     EKG EKG and rhythm strip are interpreted by myself:   EKG: [Normal sinus rhythm] at heart rate of 96, normal QRS duration, QTc 486, nonspecific ST segments and T waves no ectopy EKG not consistent with Acute STEMI Rhythm strip: NSR in lead II   RADIOLOGY XR chest: No acute abnormality on my independent review interpretation radiologist agrees    PROCEDURES:  Critical Care performed: No  Procedures   MEDICATIONS ORDERED IN ED: Medications  albuterol  (PROVENTIL ) (2.5 MG/3ML) 0.083% nebulizer solution (  Given 10/30/23 2155)  furosemide (LASIX) injection 20 mg (20 mg Intravenous Given 10/30/23 2142)     IMPRESSION / MDM / ASSESSMENT AND PLAN / ED COURSE                                Differential diagnosis includes, but is not limited to, COPD exacerbation, CHF, sepsis, electrolyte derangement anemia, URI   ED course: Patient is well-appearing and at her baseline mental status.  On arrival I did give her another breathing treatment with improvement in her symptoms.  She does have a very mild  leukocytosis but no left shift.  Her BNP is elevated to 603 today with no old to compare.  She is satting well on room air on arrival and her potassium is not low and her creatinine is 1.04.  I do think it is reasonable to give her a small dose of Lasix today.  Urinalysis is pending.  Patient can likely return home  today pending this.  I have sent a short course of prednisone and refills of nebulizing solutions to patient's pharmacy of choice   Clinical Course as of 10/30/23 2349  Sun Oct 30, 2023  2130 B Natriuretic Peptide(!): 603.4 Will give a small amount of Lasix [HD]  2328 Urinalysis, Complete w Microscopic -Urine, Clean Catch(!) Not consistent with a UTI [HD]  2349 Comprehensive metabolic panel(!) No profound electrolyte derangements or acute renal insufficiency [HD]  2349 Blood gas, venous(!!) No profound hypercarbia [HD]  2349 CBC with Differential(!) No profound leukocytosis or anemia [HD]    Clinical Course User Index [HD] Nicholaus Rolland BRAVO, MD   At time of discharge there is no evidence of acute life, limb, vision, or fertility threat. Patient has stable vital signs, pain is well controlled, patient is ambulatory and p.o. tolerant.  Discharge instructions were completed using the EPIC system. I would refer you to those at this time. All warnings prescriptions follow-up etc. were discussed in detail with the patient. Patient indicates understanding and is agreeable with this plan. All questions answered.  Patient is made aware that they may return to the emergency department for any worsening or new condition or for any other emergency.   -- Risk: 5 This patient has a high risk of morbidity due to further diagnostic testing or treatment. Rationale: This patient's evaluation and management involve a high risk of morbidity due to the potential severity of presenting symptoms, need for diagnostic testing, and/or initiation of treatment that may require close monitoring. The  differential includes conditions with potential for significant deterioration or requiring escalation of care. Treatment decisions in the ED, including medication administration, procedural interventions, or disposition planning, reflect this level of risk. COPA: 5 The patient has the following acute or chronic illness/injury that poses a possible threat to life or bodily function: [X] : The patient has a potentially serious acute condition or an acute exacerbation of a chronic illness requiring urgent evaluation and management in the Emergency Department. The clinical presentation necessitates immediate consideration of life-threatening or function-threatening diagnoses, even if they are ultimately ruled out.   FINAL CLINICAL IMPRESSION(S) / ED DIAGNOSES   Final diagnoses:  COPD exacerbation (HCC)     Rx / DC Orders   ED Discharge Orders          Ordered    albuterol  (ACCUNEB ) 0.63 MG/3ML nebulizer solution  Every 6 hours PRN        10/30/23 2139    predniSONE (DELTASONE) 20 MG tablet  Daily with breakfast        10/30/23 2147             Note:  This document was prepared using Dragon voice recognition software and may include unintentional dictation errors.   Nicholaus Rolland BRAVO, MD 10/30/23 [phone]

## 2023-11-04 ENCOUNTER — Other Ambulatory Visit: Payer: Self-pay | Admitting: Family Medicine

## 2023-11-04 LAB — BLOOD GAS, VENOUS
Acid-Base Excess: 0.5 mmol/L (ref 0.0–2.0)
Bicarbonate: 25.4 mmol/L (ref 20.0–28.0)
O2 Saturation: 41.5 %
Patient temperature: 37
pCO2, Ven: 41 mmHg — ABNORMAL LOW (ref 44–60)
pH, Ven: 7.4 (ref 7.25–7.43)

## 2023-11-05 NOTE — Telephone Encounter (Signed)
 Requested Prescriptions  Pending Prescriptions Disp Refills   atorvastatin  (LIPITOR) 40 MG tablet [Pharmacy Med Name: ATORVASTATIN  40MG  TABLET 40 Tablet] 90 tablet 0    Sig: TAKE 1 TABLET BY MOUTH DAILY     Cardiovascular:  Antilipid - Statins Failed - 11/05/2023 10:48 AM      Failed - Lipid Panel in normal range within the last 12 months    Cholesterol, Total  Date Value Ref Range Status  11/17/2022 124 100 - 199 mg/dL Final   LDL Chol Calc (NIH)  Date Value Ref Range Status  11/17/2022 54 0 - 99 mg/dL Final   HDL  Date Value Ref Range Status  11/17/2022 47 >39 mg/dL Final   Triglycerides  Date Value Ref Range Status  11/17/2022 131 0 - 149 mg/dL Final         Passed - Patient is not pregnant      Passed - Valid encounter within last 12 months    Recent Outpatient Visits           2 months ago Chronic obstructive pulmonary disease, unspecified COPD type (HCC)   [place] [facility] [place], Megan P, DO   5 months ago Chronic dementia without behavioral disturbance (HCC)   [place] [facility] [place], Megan P, DO   6 months ago Benign hypertensive renal disease   [place] [facility] [place], Megan P, DO   7 months ago Benign hypertensive renal disease   [place] [facility] [place], Megan P, DO   7 months ago Benign hypertensive renal disease   [place] [facility] [place], [place] T, NP               mirtazapine  (REMERON ) 45 MG tablet [Pharmacy Med Name: MIRTAZAPINE  45 MG TABLET 45 Tablet] 90 tablet 0    Sig: TAKE 1 TABLET BY MOUTH DAILY AT BEDTIME     Psychiatry: Antidepressants - mirtazapine  Passed - 11/05/2023 10:48 AM      Passed - Completed PHQ-2 or PHQ-9 in the last 360 days      Passed - Valid encounter within last 6 months    Recent Outpatient Visits           2 months ago Chronic obstructive pulmonary disease, unspecified COPD type (HCC)   [place] [facility] [place], Megan P, DO   5 months ago Chronic dementia without behavioral disturbance [facility])   [place] [facility] [place], Megan P, DO   6 months ago Benign hypertensive renal disease   [place] [facility] [place], Megan P, DO   7 months ago Benign hypertensive renal disease   [place] [facility] [place], Megan P, DO   7 months ago Benign hypertensive renal disease   [place] [facility] [place], Jolene T, NP               montelukast  (SINGULAIR ) 10 MG tablet [Pharmacy Med Name: MONTELUKAST  10 MG TAB 10 Tablet] 90 tablet 0    Sig: TAKE 1 TABLET BY MOUTH NIGHTLY     Pulmonology:  Leukotriene Inhibitors Passed - 11/05/2023 10:48 AM      Passed - Valid encounter within last 12 months    Recent Outpatient Visits           2 months ago Chronic obstructive pulmonary disease, unspecified COPD type (HCC)    [facility] [place], Megan P, DO   5 months ago  Chronic dementia without behavioral disturbance (HCC)   [place] [facility] [place], [place] P, DO   6 months ago Benign hypertensive renal disease   [place] [facility] [place], Megan P, DO   7 months ago Benign hypertensive renal disease   [place] [facility] [place], Megan P, DO   7 months ago Benign hypertensive renal disease   [place] [facility] [place], Jolene T, NP               sertraline  (ZOLOFT ) 50 MG tablet [Pharmacy Med Name: SERTRALINE  50 MG TABLET 50 Tablet] 135 tablet 0    Sig: TAKE 1 & 1/2 TABLETS BY MOUTH DAILY     Psychiatry:  Antidepressants - SSRI - sertraline  Failed - 11/05/2023 10:48 AM      Failed - ALT in normal range and within 360 days    ALT  Date Value Ref Range Status  10/30/2023 46 (H) 0 - 44 U/L Final         Passed - AST in normal range and within 360 days    AST  Date Value Ref Range Status  10/30/2023 36 15 - 41 U/L Final          Passed - Completed PHQ-2 or PHQ-9 in the last 360 days      Passed - Valid encounter within last 6 months    Recent Outpatient Visits           2 months ago Chronic obstructive pulmonary disease, unspecified COPD type (HCC)   [place] [facility] [place], Megan P, DO   5 months ago Chronic dementia without behavioral disturbance [facility])   [place] [facility] [place], Megan P, DO   6 months ago Benign hypertensive renal disease   [place] [facility] [place], Megan P, DO   7 months ago Benign hypertensive renal disease   [place] [facility] [place], Megan P, DO   7 months ago Benign hypertensive renal disease   [place] [facility] [place], Melanie DASEN, NP

## 2023-11-07 ENCOUNTER — Other Ambulatory Visit (HOSPITAL_COMMUNITY): Payer: Self-pay

## 2023-11-07 ENCOUNTER — Other Ambulatory Visit: Payer: Self-pay | Admitting: Family Medicine

## 2023-11-09 NOTE — Telephone Encounter (Signed)
 Too soon for refill, LRF 11/05/23.  Requested Prescriptions  Pending Prescriptions Disp Refills   montelukast  (SINGULAIR ) 10 MG tablet [Pharmacy Med Name: MONTELUKAST  10 MG TAB 10 Tablet] 90 tablet 11    Sig: TAKE 1 TABLET BY MOUTH ONCE DAILY AT NIGHT     Pulmonology:  Leukotriene Inhibitors Passed - 11/09/2023 10:47 AM      Passed - Valid encounter within last 12 months    Recent Outpatient Visits           2 months ago Chronic obstructive pulmonary disease, unspecified COPD type (HCC)   [place] [facility] [place], Megan P, DO   5 months ago Chronic dementia without behavioral disturbance (HCC)   [place] [facility] [place], Megan P, DO   6 months ago Benign hypertensive renal disease   [place] [facility] [place], Megan P, DO   7 months ago Benign hypertensive renal disease   [place] [facility] [place], Megan P, DO   7 months ago Benign hypertensive renal disease   [place] [facility] [place], Jolene T, NP               mirtazapine  (REMERON ) 45 MG tablet [Pharmacy Med Name: MIRTAZAPINE  45 MG TABLET 45 Tablet] 90 tablet 11    Sig: TAKE 1 TABLET BY MOUTH DAILY AT BEDTIME     Psychiatry: Antidepressants - mirtazapine  Passed - 11/09/2023 10:47 AM      Passed - Completed PHQ-2 or PHQ-9 in the last 360 days      Passed - Valid encounter within last 6 months    Recent Outpatient Visits           2 months ago Chronic obstructive pulmonary disease, unspecified COPD type (HCC)   [place] [facility] [place], Megan P, DO   5 months ago Chronic dementia without behavioral disturbance [facility])   [place] [facility] [place], Megan P, DO   6 months ago Benign hypertensive renal disease   [place] [place], Megan P, DO   7 months ago Benign hypertensive renal disease   [place] [facility] [place], Megan P, DO   7 months ago  Benign hypertensive renal disease    [facility] [place], Melanie DASEN, NP

## 2023-11-14 ENCOUNTER — Ambulatory Visit: Admitting: Family Medicine

## 2023-11-14 ENCOUNTER — Encounter: Payer: Self-pay | Admitting: Family Medicine

## 2023-11-14 VITALS — BP 168/87 | HR 86 | Temp 98.2°F | Wt 102.4 lb

## 2023-11-14 DIAGNOSIS — J441 Chronic obstructive pulmonary disease with (acute) exacerbation: Secondary | ICD-10-CM | POA: Diagnosis not present

## 2023-11-14 DIAGNOSIS — I129 Hypertensive chronic kidney disease with stage 1 through stage 4 chronic kidney disease, or unspecified chronic kidney disease: Secondary | ICD-10-CM | POA: Diagnosis not present

## 2023-11-14 DIAGNOSIS — Z23 Encounter for immunization: Secondary | ICD-10-CM | POA: Diagnosis not present

## 2023-11-14 DIAGNOSIS — D692 Other nonthrombocytopenic purpura: Secondary | ICD-10-CM | POA: Insufficient documentation

## 2023-11-14 DIAGNOSIS — E1122 Type 2 diabetes mellitus with diabetic chronic kidney disease: Secondary | ICD-10-CM | POA: Diagnosis not present

## 2023-11-14 DIAGNOSIS — J449 Chronic obstructive pulmonary disease, unspecified: Secondary | ICD-10-CM

## 2023-11-14 DIAGNOSIS — Z794 Long term (current) use of insulin: Secondary | ICD-10-CM

## 2023-11-14 DIAGNOSIS — Z0000 Encounter for general adult medical examination without abnormal findings: Secondary | ICD-10-CM | POA: Diagnosis not present

## 2023-11-14 DIAGNOSIS — E113293 Type 2 diabetes mellitus with mild nonproliferative diabetic retinopathy without macular edema, bilateral: Secondary | ICD-10-CM | POA: Diagnosis not present

## 2023-11-14 DIAGNOSIS — E785 Hyperlipidemia, unspecified: Secondary | ICD-10-CM | POA: Diagnosis not present

## 2023-11-14 DIAGNOSIS — F339 Major depressive disorder, recurrent, unspecified: Secondary | ICD-10-CM | POA: Diagnosis not present

## 2023-11-14 DIAGNOSIS — E1169 Type 2 diabetes mellitus with other specified complication: Secondary | ICD-10-CM | POA: Diagnosis not present

## 2023-11-14 DIAGNOSIS — N183 Chronic kidney disease, stage 3 unspecified: Secondary | ICD-10-CM | POA: Diagnosis not present

## 2023-11-14 DIAGNOSIS — F0390 Unspecified dementia without behavioral disturbance: Secondary | ICD-10-CM | POA: Diagnosis not present

## 2023-11-14 LAB — BAYER DCA HB A1C WAIVED: HB A1C (BAYER DCA - WAIVED): 6.7 % — ABNORMAL HIGH (ref 4.8–5.6)

## 2023-11-14 MED ORDER — HYDRALAZINE HCL 10 MG PO TABS: 30.0000 mg | ORAL_TABLET | Freq: Three times a day (TID) | ORAL | 1 refills | Status: AC

## 2023-11-14 MED ORDER — TRESIBA FLEXTOUCH 100 UNIT/ML ~~LOC~~ SOPN: 10.0000 [IU] | PEN_INJECTOR | Freq: Every day | SUBCUTANEOUS | 1 refills | Status: AC

## 2023-11-14 MED ORDER — LOSARTAN POTASSIUM 100 MG PO TABS: 100.0000 mg | ORAL_TABLET | Freq: Every day | ORAL | 1 refills | Status: AC

## 2023-11-14 MED ORDER — METHYLPREDNISOLONE SODIUM SUCC 125 MG IJ SOLR: 125.0000 mg | Freq: Once | INTRAMUSCULAR | Status: DC

## 2023-11-14 MED ORDER — ALBUTEROL SULFATE HFA 108 (90 BASE) MCG/ACT IN AERS: 1.0000 | INHALATION_SPRAY | Freq: Four times a day (QID) | RESPIRATORY_TRACT | 3 refills | Status: AC | PRN

## 2023-11-14 MED ORDER — PREDNISONE 10 MG PO TABS: ORAL_TABLET | ORAL | 0 refills | Status: AC

## 2023-11-14 MED ORDER — HYDROCHLOROTHIAZIDE 25 MG PO TABS: 25.0000 mg | ORAL_TABLET | Freq: Every day | ORAL | 1 refills | Status: AC

## 2023-11-14 MED ORDER — MIRTAZAPINE 45 MG PO TABS: 45.0000 mg | ORAL_TABLET | Freq: Every day | ORAL | 1 refills | Status: AC

## 2023-11-14 MED ORDER — SERTRALINE HCL 50 MG PO TABS: 75.0000 mg | ORAL_TABLET | Freq: Every day | ORAL | 1 refills | Status: AC

## 2023-11-14 MED ORDER — ALENDRONATE SODIUM 70 MG PO TABS
70.0000 mg | ORAL_TABLET | ORAL | 4 refills | Status: AC
Start: 2023-11-14 — End: ?

## 2023-11-14 MED ORDER — ALBUTEROL SULFATE 0.63 MG/3ML IN NEBU: 1.0000 | INHALATION_SOLUTION | Freq: Four times a day (QID) | RESPIRATORY_TRACT | 12 refills | Status: AC | PRN

## 2023-11-14 MED ORDER — SITAGLIPTIN PHOSPHATE 100 MG PO TABS: 100.0000 mg | ORAL_TABLET | Freq: Every day | ORAL | 1 refills | Status: AC

## 2023-11-14 MED ORDER — NOVOLOG FLEXPEN 100 UNIT/ML ~~LOC~~ SOPN: 10.0000 [IU] | PEN_INJECTOR | Freq: Three times a day (TID) | SUBCUTANEOUS | 3 refills | Status: AC

## 2023-11-14 MED ORDER — AZITHROMYCIN 250 MG PO TABS: ORAL_TABLET | ORAL | 0 refills | Status: AC

## 2023-11-14 MED ORDER — ATORVASTATIN CALCIUM 40 MG PO TABS
40.0000 mg | ORAL_TABLET | Freq: Every day | ORAL | 1 refills | Status: AC
Start: 2023-11-14 — End: ?

## 2023-11-14 MED ORDER — METHYLPREDNISOLONE SODIUM SUCC 40 MG IJ SOLR
40.0000 mg | Freq: Once | INTRAMUSCULAR | Status: AC
  Administered 2023-11-14: 40 mg via INTRAMUSCULAR

## 2023-11-14 MED ORDER — MONTELUKAST SODIUM 10 MG PO TABS: 10.0000 mg | ORAL_TABLET | Freq: Every day | ORAL | 1 refills | Status: AC

## 2023-11-14 NOTE — Assessment & Plan Note (Signed)
 Under good control on current regimen. Continue current regimen. Continue to monitor. Call with any concerns. Refills given. Labs drawn today.

## 2023-11-14 NOTE — Assessment & Plan Note (Signed)
 Rechecking labs today. Await results. Treat as needed.

## 2023-11-14 NOTE — Assessment & Plan Note (Signed)
 Stable. Continue to monitor. Call with any concerns.  ?

## 2023-11-14 NOTE — Assessment & Plan Note (Signed)
 Managed by endocrionology. Doing well with A1c of 6.7- up from 6.2 due to her recent steroids. Call with any concerns. Continue to monitor.

## 2023-11-14 NOTE — Progress Notes (Signed)
 BP (!) 168/87   Pulse 86   Temp 98.2 F (36.8 C) (Oral)   Wt 102 lb 6 oz (46.4 kg)   SpO2 97%   BMI 21.40 kg/m    Subjective:    Patient ID: Angela Neal, female    DOB: 01-Nov-1921, 89 y.o.   MRN: [id number]  HPI: Angela Neal is a 89 y.o. female presenting on 11/14/2023 for comprehensive medical examination. Current medical complaints include:  DIABETES Hypoglycemic episodes:no Polydipsia/polyuria: yes Visual disturbance: no Chest pain: no Paresthesias: no Glucose Monitoring: yes  Accucheck frequency: TID Taking Insulin ?: yes Blood Pressure Monitoring: a few times a week Retinal Examination: Up to Date Foot Exam: Up to Date Diabetic Education: Completed Pneumovax: Up to Date Influenza: Up to Date Aspirin: no  HYPERTENSION / HYPERLIPIDEMIA Satisfied with current treatment? no Duration of hypertension: chronic BP monitoring frequency: a few times a week BP range: 200s/100s BP medication side effects: no Past BP meds: hydrochlorothiazide , hydralazine , losatan Duration of hyperlipidemia: chronic Cholesterol medication side effects: no Cholesterol supplements: none Past cholesterol medications: atorvastatin  Medication compliance: fair compliance Aspirin: no Recent stressors: no Recurrent headaches: no Visual changes: no Palpitations: no Dyspnea: yes Chest pain: no Lower extremity edema: no Dizzy/lightheaded: no  COPD COPD status: exacerbated Satisfied with current treatment?: no Oxygen use: no Dyspnea frequency: daily Cough frequency: daily Rescue inhaler frequency: occasionally   Limitation of activity: yes Productive cough: no Pneumovax: Up to Date Influenza: Up to Date  She currently lives with: daughter and son in law Menopausal Symptoms: no  Depression Screen done today and results listed below:     08/25/2023    2:09 PM 05/23/2023    2:29 PM 03/28/2023    2:24 PM 02/04/2023    3:19 PM 08/17/2022    1:21 PM  Depression screen PHQ  2/9  Decreased Interest 3 3 0 3 2  Down, Depressed, Hopeless  2 0 0 0  PHQ - 2 Score 3 5 0 3 2  Altered sleeping 2 2 0 1 0  Tired, decreased energy 3 2 0 3 1  Change in appetite  3 0 3 3  Feeling bad or failure about yourself  2 2 0 0 1  Trouble concentrating 3 3 0 3 1  Moving slowly or fidgety/restless 1 2 0 0 0  Suicidal thoughts 1 1 0 0 0  PHQ-9 Score 15  20  0  13  8   Difficult doing work/chores Somewhat difficult Somewhat difficult Not difficult at all  Not difficult at all     Data saved with a previous flowsheet row definition    Past Medical History:  Past Medical History:  Diagnosis Date   Asthma    COPD (chronic obstructive pulmonary disease) (HCC)    Diabetes mellitus without complication (HCC)    Hypertension    Macular degeneration    Stroke [facility])     Surgical History:  Past Surgical History:  Procedure Laterality Date   ABDOMINAL HYSTERECTOMY     HIP ARTHROPLASTY      Medications:  Current Outpatient Medications on File Prior to Visit  Medication Sig   budesonide -formoterol  (SYMBICORT ) 80-4.5 MCG/ACT inhaler Inhale 2 puffs into the lungs 2 (two) times daily.   Cholecalciferol (GNP VITAMIN D3 EXTRA STRENGTH) 25 MCG (1000 UT) tablet Take 1 tablet (1,000 Units total) by mouth daily.   Continuous Glucose Receiver (FREESTYLE LIBRE 3 READER) DEVI Use 1 each as directed   fluticasone  (FLONASE ) 50 MCG/ACT nasal spray  SPRAY 2 SPRAYS INTO EACH NOSTRIL EVERY DAY *NEW PRESCRIPTION REQUEST*   GNP NATURAL FIBER 0.52 g capsule TAKE 1 CAPSULE BY MOUTH ONCE DAILY AT NOON   LEVEMIR  FLEXTOUCH 100 UNIT/ML FlexTouch Pen Inject 8 Units into the skin at bedtime.   Multiple Vitamins-Minerals (GNP HEALTHY EYES SUPERVISION 2) CAPS Take 1 capsule by mouth 2 (two) times daily.   Spacer/Aero-Holding Raguel FRENCH Use as directed with inhaler. Dx: cough   Tiotropium Bromide  Monohydrate 2.5 MCG/ACT AERS Inhale 3 puffs into the lungs 2 (two) times daily as needed.   TRUE METRIX BLOOD  GLUCOSE TEST test strip TEST BLOOD SUGAR 4 TIMES DAILY BEFORE LEVEMIR  IS GIVEN OR AS DIRECTED BY PHYSICIAN   TRUEplus Safety Lancets 28G MISC USE FOR FINGER STICK BLOOD SUGARS (CURRENTLY 4 TIMES DAILY)   ULTRACARE PEN NEEDLES 33G X 4 MM MISC USE WITH INSULIN  PENS AS DIRECTED BY PHYSICIAN (CURRENTLY 4 TIMES DAILY)   No current facility-administered medications on file prior to visit.    Allergies:  Allergies  Allergen Reactions   Sulfa Antibiotics Anaphylaxis   Amlodipine  Other (See Comments)    Leg swelling    Social History:  Social History   Socioeconomic History   Marital status: Widowed    Spouse name: Not on file   Number of children: 3   Years of education: Not on file   Highest education level: Not on file  Occupational History   Not on file  Tobacco Use   Smoking status: Former    Current packs/day: 0.00    Average packs/day: 1.3 packs/day for 10.0 years (12.5 ttl pk-yrs)    Types: Cigarettes    Start date: 37    Quit date: 77    Years since quitting: 70.9   Smokeless tobacco: Never  Vaping Use   Vaping status: Never Used  Substance and Sexual Activity   Alcohol use: Never   Drug use: Never   Sexual activity: Not Currently  Other Topics Concern   Not on file  Social History Narrative   2 living children, 1 deceased child at childbirth (lived 47 hours)   Social Drivers of Corporate Investment Banker Strain: Low Risk  (01/25/2023)   Overall Financial Resource Strain (CARDIA)    Difficulty of Paying Living Expenses: Not very hard  Food Insecurity: No Food Insecurity (01/25/2023)   Hunger Vital Sign    Worried About Running Out of Food in the Last Year: Never true    Ran Out of Food in the Last Year: Never true  Transportation Needs: No Transportation Needs (01/25/2023)   PRAPARE - Administrator, Civil Service (Medical): No    Lack of Transportation (Non-Medical): No  Physical Activity: Inactive (01/25/2023)   Exercise Vital Sign    Days of  Exercise per Week: 0 days    Minutes of Exercise per Session: 0 min  Stress: Not on file  Social Connections: Socially Isolated (01/25/2023)   Social Connection and Isolation Panel    Frequency of Communication with Friends and Family: Never    Frequency of Social Gatherings with Friends and Family: More than three times a week    Attends Religious Services: Never    Database Administrator or Organizations: No    Attends Banker Meetings: Never    Marital Status: Widowed  Intimate Partner Violence: Not At Risk (01/25/2023)   Humiliation, Afraid, Rape, and Kick questionnaire    Fear of Current or Ex-Partner: No  Emotionally Abused: No    Physically Abused: No    Sexually Abused: No   Social History   Tobacco Use  Smoking Status Former   Current packs/day: 0.00   Average packs/day: 1.3 packs/day for 10.0 years (12.5 ttl pk-yrs)   Types: Cigarettes   Start date: 46   Quit date: 78   Years since quitting: 70.9  Smokeless Tobacco Never   Social History   Substance and Sexual Activity  Alcohol Use Never    Family History:  Family History  Problem Relation Age of Onset   Arthritis Mother    Heart disease Mother    Heart attack Father 84    Past medical history, surgical history, medications, allergies, family history and social history reviewed with patient today and changes made to appropriate areas of the chart.   Review of Systems  Constitutional: Negative.   HENT: Negative.    Eyes: Negative.   Respiratory:  Positive for cough, shortness of breath and wheezing. Negative for hemoptysis and sputum production.   Cardiovascular: Negative.   Gastrointestinal: Negative.   Genitourinary: Negative.   Musculoskeletal: Negative.   Skin: Negative.   Neurological: Negative.   Endo/Heme/Allergies:  Negative for environmental allergies and polydipsia. Bruises/bleeds easily.  Psychiatric/Behavioral: Negative.     All other ROS negative except what is listed  above and in the HPI.      Objective:    BP (!) 168/87   Pulse 86   Temp 98.2 F (36.8 C) (Oral)   Wt 102 lb 6 oz (46.4 kg)   SpO2 97%   BMI 21.40 kg/m   Wt Readings from Last 3 Encounters:  11/14/23 102 lb 6 oz (46.4 kg)  10/30/23 104 lb 9.6 oz (47.4 kg)  09/17/23 105 lb 13.1 oz (48 kg)    Physical Exam Vitals and nursing note reviewed.  Constitutional:      General: She is not in acute distress.    Appearance: Normal appearance. She is not ill-appearing, toxic-appearing or diaphoretic.  HENT:     Head: Normocephalic and atraumatic.     Right Ear: Tympanic membrane, ear canal and external ear normal. There is no impacted cerumen.     Left Ear: Tympanic membrane, ear canal and external ear normal. There is no impacted cerumen.     Nose: Nose normal. No congestion or rhinorrhea.     Mouth/Throat:     Mouth: Mucous membranes are moist.     Pharynx: Oropharynx is clear. No oropharyngeal exudate or posterior oropharyngeal erythema.  Eyes:     General: No scleral icterus.       Right eye: No discharge.        Left eye: No discharge.     Extraocular Movements: Extraocular movements intact.     Conjunctiva/sclera: Conjunctivae normal.     Pupils: Pupils are equal, round, and reactive to light.  Neck:     Vascular: No carotid bruit.  Cardiovascular:     Rate and Rhythm: Normal rate and regular rhythm.     Pulses: Normal pulses.     Heart sounds: No murmur heard.    No friction rub. No gallop.  Pulmonary:     Effort: Pulmonary effort is normal. No respiratory distress.     Breath sounds: No stridor. Wheezing present. No rhonchi or rales.  Chest:     Chest wall: No tenderness.  Abdominal:     General: Abdomen is flat. Bowel sounds are normal. There is no distension.  Palpations: Abdomen is soft. There is no mass.     Tenderness: There is no abdominal tenderness. There is no right CVA tenderness, left CVA tenderness, guarding or rebound.     Hernia: No hernia is present.   Genitourinary:    Comments: Breast and pelvic exams deferred with shared decision making Musculoskeletal:        General: No swelling, tenderness, deformity or signs of injury.     Cervical back: Normal range of motion and neck supple. No rigidity. No muscular tenderness.     Right lower leg: No edema.     Left lower leg: No edema.  Lymphadenopathy:     Cervical: No cervical adenopathy.  Skin:    General: Skin is warm and dry.     Capillary Refill: Capillary refill takes less than 2 seconds.     Coloration: Skin is not jaundiced or pale.     Findings: No bruising, erythema, lesion or rash.  Neurological:     General: No focal deficit present.     Mental Status: She is alert and oriented to person, place, and time. Mental status is at baseline.     Cranial Nerves: No cranial nerve deficit.     Sensory: No sensory deficit.     Motor: No weakness.     Coordination: Coordination normal.     Gait: Gait normal.     Deep Tendon Reflexes: Reflexes normal.  Psychiatric:        Mood and Affect: Mood normal.        Behavior: Behavior normal.        Thought Content: Thought content normal.        Judgment: Judgment normal.     Results for orders placed or performed during the hospital encounter of 10/30/23  CBC with Differential   Collection Time: 10/30/23  8:15 PM  Result Value Ref Range   WBC 10.7 (H) 4.0 - 10.5 K/uL   RBC 3.84 (L) 3.87 - 5.11 MIL/uL   Hemoglobin 12.2 12.0 - 15.0 g/dL   HCT 63.2 63.9 - 53.9 %   MCV 95.6 80.0 - 100.0 fL   MCH 31.8 26.0 - 34.0 pg   MCHC 33.2 30.0 - 36.0 g/dL   RDW 86.5 88.4 - 84.4 %   Platelets 188 150 - 400 K/uL   nRBC 0.0 0.0 - 0.2 %   Neutrophils Relative % 67 %   Neutro Abs 7.3 1.7 - 7.7 K/uL   Lymphocytes Relative 17 %   Lymphs Abs 1.8 0.7 - 4.0 K/uL   Monocytes Relative 11 %   Monocytes Absolute 1.2 (H) 0.1 - 1.0 K/uL   Eosinophils Relative 3 %   Eosinophils Absolute 0.3 0.0 - 0.5 K/uL   Basophils Relative 1 %   Basophils Absolute  0.1 0.0 - 0.1 K/uL   Immature Granulocytes 1 %   Abs Immature Granulocytes 0.05 0.00 - 0.07 K/uL  Comprehensive metabolic panel   Collection Time: 10/30/23  8:15 PM  Result Value Ref Range   Sodium 133 (L) 135 - 145 mmol/L   Potassium 4.1 3.5 - 5.1 mmol/L   Chloride 100 98 - 111 mmol/L   CO2 22 22 - 32 mmol/L   Glucose, Bld 157 (H) 70 - 99 mg/dL   BUN 36 (H) 8 - 23 mg/dL   Creatinine, Ser 8.95 (H) 0.44 - 1.00 mg/dL   Calcium  8.8 (L) 8.9 - 10.3 mg/dL   Total Protein 7.4 6.5 - 8.1 g/dL   Albumin 4.2  3.5 - 5.0 g/dL   AST 36 15 - 41 U/L   ALT 46 (H) 0 - 44 U/L   Alkaline Phosphatase 78 38 - 126 U/L   Total Bilirubin 0.8 0.0 - 1.2 mg/dL   GFR, Estimated 48 (L) >60 mL/min   Anion gap 11 5 - 15  Lipase, blood   Collection Time: 10/30/23  8:15 PM  Result Value Ref Range   Lipase 34 11 - 51 U/L  Brain natriuretic peptide   Collection Time: 10/30/23  8:15 PM  Result Value Ref Range   B Natriuretic Peptide 603.4 (H) 0.0 - 100.0 pg/mL  Blood gas, venous   Collection Time: 10/30/23  8:20 PM  Result Value Ref Range   pH, Ven 7.4 7.25 - 7.43   pCO2, Ven 41 (L) 44 - 60 mmHg   Bicarbonate 25.4 20.0 - 28.0 mmol/L   Acid-Base Excess 0.5 0.0 - 2.0 mmol/L   O2 Saturation 41.5 %   Patient temperature 37.0    Collection site VEIN   Resp panel by RT-PCR (RSV, Flu A&B, Covid) Anterior Nasal Swab   Collection Time: 10/30/23  8:50 PM   Specimen: Anterior Nasal Swab  Result Value Ref Range   SARS Coronavirus 2 by RT PCR NEGATIVE NEGATIVE   Influenza A by PCR NEGATIVE NEGATIVE   Influenza B by PCR NEGATIVE NEGATIVE   Resp Syncytial Virus by PCR NEGATIVE NEGATIVE  Urinalysis, Complete w Microscopic -Urine, Clean Catch   Collection Time: 10/30/23 10:56 PM  Result Value Ref Range   Color, Urine STRAW (A) YELLOW   APPearance CLEAR (A) CLEAR   Specific Gravity, Urine 1.008 1.005 - 1.030   pH 5.0 5.0 - 8.0   Glucose, UA NEGATIVE NEGATIVE mg/dL   Hgb urine dipstick NEGATIVE NEGATIVE   Bilirubin  Urine NEGATIVE NEGATIVE   Ketones, ur NEGATIVE NEGATIVE mg/dL   Protein, ur NEGATIVE NEGATIVE mg/dL   Nitrite NEGATIVE NEGATIVE   Leukocytes,Ua NEGATIVE NEGATIVE   RBC / HPF 0 0 - 5 RBC/hpf   WBC, UA 0-5 0 - 5 WBC/hpf   Bacteria, UA NONE SEEN NONE SEEN   Squamous Epithelial / HPF 0 0 - 5 /HPF      Assessment & Plan:   Problem List Items Addressed This Visit       Cardiovascular and Mediastinum   Senile purpura   Reassured patient. Continue to monitor.       Relevant Medications   atorvastatin  (LIPITOR) 40 MG tablet   hydrALAZINE  (APRESOLINE ) 10 MG tablet   hydrochlorothiazide  (HYDRODIURIL ) 25 MG tablet   losartan  (COZAAR ) 100 MG tablet     Respiratory   COPD (chronic obstructive pulmonary disease) (HCC)   In exacerbation. Will treat with prednisone and azithromycin. Call with any concerns. Continue to monitor.       Relevant Medications   albuterol  (ACCUNEB ) 0.63 MG/3ML nebulizer solution   albuterol  (VENTOLIN  HFA) 108 (90 Base) MCG/ACT inhaler   montelukast  (SINGULAIR ) 10 MG tablet   predniSONE (DELTASONE) 10 MG tablet   azithromycin (ZITHROMAX) 250 MG tablet     Endocrine   Type 2 diabetes mellitus with renal complication (HCC)   Managed by endocrionology. Doing well with A1c of 6.7- up from 6.2 due to her recent steroids. Call with any concerns. Continue to monitor.       Relevant Medications   atorvastatin  (LIPITOR) 40 MG tablet   insulin  degludec (TRESIBA FLEXTOUCH) 100 UNIT/ML FlexTouch Pen   sitaGLIPtin  (JANUVIA ) 100 MG tablet   losartan  (  COZAAR ) 100 MG tablet   NOVOLOG  FLEXPEN 100 UNIT/ML FlexPen   Other Relevant Orders   CBC with Differential/Platelet   Comprehensive metabolic panel with GFR   Bayer DCA Hb A1c Waived   Hyperlipidemia associated with type 2 diabetes mellitus (HCC)   Under good control on current regimen. Continue current regimen. Continue to monitor. Call with any concerns. Refills given. Labs drawn today.        Relevant Medications    atorvastatin  (LIPITOR) 40 MG tablet   hydrALAZINE  (APRESOLINE ) 10 MG tablet   hydrochlorothiazide  (HYDRODIURIL ) 25 MG tablet   insulin  degludec (TRESIBA FLEXTOUCH) 100 UNIT/ML FlexTouch Pen   sitaGLIPtin  (JANUVIA ) 100 MG tablet   losartan  (COZAAR ) 100 MG tablet   NOVOLOG  FLEXPEN 100 UNIT/ML FlexPen   Other Relevant Orders   CBC with Differential/Platelet   Comprehensive metabolic panel with GFR   Lipid Panel w/o Chol/HDL Ratio   Non-proliferative diabetic retinopathy, both eyes (HCC)   Continue to follow with ophthalmology. Call with any concerns.       Relevant Medications   atorvastatin  (LIPITOR) 40 MG tablet   insulin  degludec (TRESIBA FLEXTOUCH) 100 UNIT/ML FlexTouch Pen   sitaGLIPtin  (JANUVIA ) 100 MG tablet   losartan  (COZAAR ) 100 MG tablet   NOVOLOG  FLEXPEN 100 UNIT/ML FlexPen     Nervous and Auditory   Chronic dementia without behavioral disturbance (HCC)   Stable. Continue to monitor. Call with any concerns.       Relevant Medications   mirtazapine  (REMERON ) 45 MG tablet   sertraline  (ZOLOFT ) 50 MG tablet     Genitourinary   Benign hypertensive renal disease   Not under good control. Has been off her medicine. Will restart her medicine. Recheck in 2 weeks.       Relevant Orders   CBC with Differential/Platelet   Comprehensive metabolic panel with GFR   TSH   Microalbumin, Urine Waived   Stage 3 chronic kidney disease (HCC)   Rechecking labs today. Await results. Treat as needed.         Other   Depression, recurrent   Under good control on current regimen. Continue current regimen. Continue to monitor. Call with any concerns. Refills given. Labs drawn today.       Relevant Medications   mirtazapine  (REMERON ) 45 MG tablet   sertraline  (ZOLOFT ) 50 MG tablet   Other Visit Diagnoses       Routine general medical examination at a health care facility    -  Primary   Vaccines up to date. Screening labs checked today. DEXA up to date. Continue diet and  exercise. Call wtih any concerns.     COPD exacerbation (HCC)       In exacerbation- will treat with prednisone and azithromycin. Conitnue inhalers. Recheck 2 weeks.   Relevant Medications   albuterol  (ACCUNEB ) 0.63 MG/3ML nebulizer solution   albuterol  (VENTOLIN  HFA) 108 (90 Base) MCG/ACT inhaler   montelukast  (SINGULAIR ) 10 MG tablet   predniSONE (DELTASONE) 10 MG tablet   azithromycin (ZITHROMAX) 250 MG tablet   methylPREDNISolone sodium succinate (SOLU-MEDROL) 40 mg/mL injection 40 mg (Completed) (Start on 11/14/2023  2:30 PM)     Needs flu shot       Flu shot given today.        Follow up plan: Return in about 2 weeks (around 11/28/2023).   LABORATORY TESTING:  - Pap smear: not applicable  IMMUNIZATIONS:   - Tdap: Tetanus vaccination status reviewed: Declined. - Influenza: Administered today - Pneumovax: Up to date -  Prevnar: Up to date - COVID: Refused - HPV: Not applicable - Shingrix vaccine: Refused  SCREENING: -Mammogram: Not applicable  - Colonoscopy: Not applicable  - Bone Density: Up to date   PATIENT COUNSELING:   Advised to take 1 mg of folate supplement per day if capable of pregnancy.   Sexuality: Discussed sexually transmitted diseases, partner selection, use of condoms, avoidance of unintended pregnancy  and contraceptive alternatives.   Advised to avoid cigarette smoking.  I discussed with the patient that most people either abstain from alcohol or drink within safe limits (<=14/week and <=4 drinks/occasion for males, <=7/weeks and <= 3 drinks/occasion for females) and that the risk for alcohol disorders and other health effects rises proportionally with the number of drinks per week and how often a drinker exceeds daily limits.  Discussed cessation/primary prevention of drug use and availability of treatment for abuse.   Diet: Encouraged to adjust caloric intake to maintain  or achieve ideal body weight, to reduce intake of dietary saturated fat and  total fat, to limit sodium intake by avoiding high sodium foods and not adding table salt, and to maintain adequate dietary potassium and calcium  preferably from fresh fruits, vegetables, and low-fat dairy products.    stressed the importance of regular exercise  Injury prevention: Discussed safety belts, safety helmets, smoke detector, smoking near bedding or upholstery.   Dental health: Discussed importance of regular tooth brushing, flossing, and dental visits.    NEXT PREVENTATIVE PHYSICAL DUE IN 1 YEAR. Return in about 2 weeks (around 11/28/2023).

## 2023-11-14 NOTE — Assessment & Plan Note (Signed)
Continue to follow with ophthalmology. Call with any concerns.  

## 2023-11-14 NOTE — Assessment & Plan Note (Signed)
 Not under good control. Has been off her medicine. Will restart her medicine. Recheck in 2 weeks.

## 2023-11-14 NOTE — Assessment & Plan Note (Signed)
 In exacerbation. Will treat with prednisone and azithromycin. Call with any concerns. Continue to monitor.

## 2023-11-14 NOTE — Assessment & Plan Note (Signed)
 Reassured patient. Continue to monitor.

## 2023-11-15 ENCOUNTER — Ambulatory Visit: Payer: Self-pay | Admitting: Family Medicine

## 2023-11-15 LAB — CBC WITH DIFFERENTIAL/PLATELET
Basophils Absolute: 0.1 x10E3/uL (ref 0.0–0.2)
Basos: 1 %
EOS (ABSOLUTE): 0.3 x10E3/uL (ref 0.0–0.4)
Eos: 3 %
Hematocrit: 38.9 % (ref 34.0–46.6)
Hemoglobin: 13 g/dL (ref 11.1–15.9)
Immature Grans (Abs): 0 x10E3/uL (ref 0.0–0.1)
Immature Granulocytes: 0 %
Lymphocytes Absolute: 1.4 x10E3/uL (ref 0.7–3.1)
Lymphs: 14 %
MCH: 32 pg (ref 26.6–33.0)
MCHC: 33.4 g/dL (ref 31.5–35.7)
MCV: 96 fL (ref 79–97)
Monocytes Absolute: 0.9 x10E3/uL (ref 0.1–0.9)
Monocytes: 9 %
Neutrophils Absolute: 7.1 x10E3/uL — ABNORMAL HIGH (ref 1.4–7.0)
Neutrophils: 73 %
Platelets: 173 x10E3/uL (ref 150–450)
RBC: 4.06 x10E6/uL (ref 3.77–5.28)
RDW: 12.2 % (ref 11.7–15.4)
WBC: 9.8 x10E3/uL (ref 3.4–10.8)

## 2023-11-15 LAB — COMPREHENSIVE METABOLIC PANEL WITH GFR
ALT: 60 IU/L — ABNORMAL HIGH (ref 0–32)
AST: 46 IU/L — ABNORMAL HIGH (ref 0–40)
Albumin: 4.4 g/dL (ref 3.6–4.6)
Alkaline Phosphatase: 95 IU/L (ref 48–129)
BUN/Creatinine Ratio: 25 (ref 12–28)
BUN: 27 mg/dL (ref 10–36)
Bilirubin Total: 0.2 mg/dL (ref 0.0–1.2)
CO2: 24 mmol/L (ref 20–29)
Calcium: 9.5 mg/dL (ref 8.7–10.3)
Chloride: 103 mmol/L (ref 96–106)
Creatinine, Ser: 1.07 mg/dL — ABNORMAL HIGH (ref 0.57–1.00)
Globulin, Total: 2.8 g/dL (ref 1.5–4.5)
Glucose: 109 mg/dL — ABNORMAL HIGH (ref 70–99)
Potassium: 4.1 mmol/L (ref 3.5–5.2)
Sodium: 140 mmol/L (ref 134–144)
Total Protein: 7.2 g/dL (ref 6.0–8.5)
eGFR: 46 mL/min/1.73 — ABNORMAL LOW (ref >59)

## 2023-11-15 LAB — LIPID PANEL W/O CHOL/HDL RATIO
Cholesterol, Total: 198 mg/dL (ref 100–199)
HDL: 53 mg/dL (ref >39)
LDL Chol Calc (NIH): 113 mg/dL — ABNORMAL HIGH (ref 0–99)
Triglycerides: 185 mg/dL — ABNORMAL HIGH (ref 0–149)
VLDL Cholesterol Cal: 32 mg/dL (ref 5–40)

## 2023-11-15 LAB — TSH: TSH: 0.528 u[IU]/mL (ref 0.450–4.500)

## 2023-11-23 DIAGNOSIS — Z961 Presence of intraocular lens: Secondary | ICD-10-CM | POA: Diagnosis not present

## 2023-11-23 DIAGNOSIS — E113393 Type 2 diabetes mellitus with moderate nonproliferative diabetic retinopathy without macular edema, bilateral: Secondary | ICD-10-CM | POA: Diagnosis not present

## 2023-11-23 DIAGNOSIS — H353133 Nonexudative age-related macular degeneration, bilateral, advanced atrophic without subfoveal involvement: Secondary | ICD-10-CM | POA: Diagnosis not present

## 2023-11-23 LAB — OPHTHALMOLOGY REPORT-SCANNED

## 2023-11-28 ENCOUNTER — Ambulatory Visit
Admission: RE | Admit: 2023-11-28 | Discharge: 2023-11-28 | Disposition: A | Discharge status: Home / Self Care | Source: Ambulatory Visit | Attending: Family Medicine | Admitting: Family Medicine

## 2023-11-28 ENCOUNTER — Ambulatory Visit: Admitting: Family Medicine

## 2023-11-28 ENCOUNTER — Encounter: Payer: Self-pay | Admitting: Family Medicine

## 2023-11-28 VITALS — BP 130/72 | HR 75 | Temp 97.9°F | Ht <= 58 in | Wt 104.0 lb

## 2023-11-28 DIAGNOSIS — E1122 Type 2 diabetes mellitus with diabetic chronic kidney disease: Secondary | ICD-10-CM

## 2023-11-28 DIAGNOSIS — N183 Chronic kidney disease, stage 3 unspecified: Secondary | ICD-10-CM

## 2023-11-28 DIAGNOSIS — Z794 Long term (current) use of insulin: Secondary | ICD-10-CM

## 2023-11-28 DIAGNOSIS — I129 Hypertensive chronic kidney disease with stage 1 through stage 4 chronic kidney disease, or unspecified chronic kidney disease: Secondary | ICD-10-CM

## 2023-11-28 DIAGNOSIS — F0390 Unspecified dementia without behavioral disturbance: Secondary | ICD-10-CM

## 2023-11-28 DIAGNOSIS — J189 Pneumonia, unspecified organism: Secondary | ICD-10-CM | POA: Insufficient documentation

## 2023-11-28 DIAGNOSIS — R0681 Apnea, not elsewhere classified: Secondary | ICD-10-CM

## 2023-11-28 DIAGNOSIS — E785 Hyperlipidemia, unspecified: Secondary | ICD-10-CM | POA: Diagnosis not present

## 2023-11-28 DIAGNOSIS — J449 Chronic obstructive pulmonary disease, unspecified: Secondary | ICD-10-CM | POA: Diagnosis not present

## 2023-11-28 DIAGNOSIS — E1169 Type 2 diabetes mellitus with other specified complication: Secondary | ICD-10-CM

## 2023-11-28 DIAGNOSIS — R918 Other nonspecific abnormal finding of lung field: Secondary | ICD-10-CM | POA: Diagnosis not present

## 2023-11-28 DIAGNOSIS — R062 Wheezing: Secondary | ICD-10-CM | POA: Diagnosis not present

## 2023-11-28 DIAGNOSIS — J90 Pleural effusion, not elsewhere classified: Secondary | ICD-10-CM | POA: Diagnosis not present

## 2023-11-28 DIAGNOSIS — I517 Cardiomegaly: Secondary | ICD-10-CM | POA: Diagnosis not present

## 2023-11-28 LAB — MICROALBUMIN, URINE WAIVED
Creatinine, Urine Waived: 50 mg/dL (ref 10–300)
Microalb, Ur Waived: 30 mg/L — ABNORMAL HIGH (ref 0–19)
Microalb/Creat Ratio: >300 mg/g — ABNORMAL HIGH (ref <30)

## 2023-11-28 MED ORDER — AMOXICILLIN-POT CLAVULANATE 875-125 MG PO TABS: 1.0000 | ORAL_TABLET | Freq: Two times a day (BID) | ORAL | 0 refills | Status: AC

## 2023-11-28 NOTE — Progress Notes (Signed)
 BP 130/72   Pulse 75   Temp 97.9 F (36.6 C)   Ht 4' 10 (1.473 m)   Wt 104 lb (47.2 kg)   SpO2 93%   BMI 21.74 kg/m    Subjective:    Patient ID: Angela Neal, female    DOB: [birth year], 89 y.o.   MRN: [id number]  HPI: Angela Neal is a 89 y.o. female  Chief Complaint  Patient presents with   COPD   Daughter notes that she is not doing any better. She continues to cough at night. She has not been able to deeply breathe in her inhalers. She has not been running a fever. Has been significantly worse at night. Daughter is quite worried about her.   ???SLEEP APNEA Sleep apnea status: uncontrolled Duration: weeks Satisfied with current treatment?:  no CPAP use:  no Last sleep study: N/A Wakes feeling refreshed:  no Daytime hypersomnolence:  yes Fatigue:  yes Insomnia:  no Good sleep hygiene:  yes Difficulty falling asleep:  no Difficulty staying asleep:  no Snoring bothers bed partner:  no Observed apnea by bed partner: yes Obesity:  no Hypertension: no  Pulmonary hypertension:  no Coronary artery disease:  no   Relevant past medical, surgical, family and social history reviewed and updated as indicated. Interim medical history since our last visit reviewed. Allergies and medications reviewed and updated.  Review of Systems  Constitutional: Negative.   HENT: Negative.    Respiratory:  Positive for cough and shortness of breath. Negative for apnea, choking, chest tightness, wheezing and stridor.   Cardiovascular: Negative.   Gastrointestinal: Negative.   Psychiatric/Behavioral: Negative.      Per HPI unless specifically indicated above     Objective:    BP 130/72   Pulse 75   Temp 97.9 F (36.6 C)   Ht 4' 10 (1.473 m)   Wt 104 lb (47.2 kg)   SpO2 93%   BMI 21.74 kg/m   Wt Readings from Last 3 Encounters:  11/28/23 104 lb (47.2 kg)  11/14/23 102 lb 6 oz (46.4 kg)  10/30/23 104 lb 9.6 oz (47.4 kg)    Physical Exam Vitals and nursing  note reviewed.  Constitutional:      General: She is not in acute distress.    Appearance: Normal appearance. She is not ill-appearing, toxic-appearing or diaphoretic.  HENT:     Head: Normocephalic and atraumatic.     Right Ear: External ear normal.     Left Ear: External ear normal.     Nose: Nose normal.     Mouth/Throat:     Mouth: Mucous membranes are moist.     Pharynx: Oropharynx is clear.  Eyes:     General: No scleral icterus.       Right eye: No discharge.        Left eye: No discharge.     Extraocular Movements: Extraocular movements intact.     Conjunctiva/sclera: Conjunctivae normal.     Pupils: Pupils are equal, round, and reactive to light.  Cardiovascular:     Rate and Rhythm: Normal rate and regular rhythm.     Pulses: Normal pulses.     Heart sounds: Normal heart sounds. No murmur heard.    No friction rub. No gallop.  Pulmonary:     Effort: Pulmonary effort is normal. No respiratory distress.     Breath sounds: No stridor. Rhonchi (L middle lobe) present. No wheezing or rales.  Chest:  Chest wall: No tenderness.  Musculoskeletal:        General: Normal range of motion.     Cervical back: Normal range of motion and neck supple.  Skin:    General: Skin is warm and dry.     Capillary Refill: Capillary refill takes less than 2 seconds.     Coloration: Skin is not jaundiced or pale.     Findings: No bruising, erythema, lesion or rash.  Neurological:     General: No focal deficit present.     Mental Status: She is alert and oriented to person, place, and time. Mental status is at baseline.  Psychiatric:        Mood and Affect: Mood normal.        Behavior: Behavior normal.        Thought Content: Thought content normal.        Judgment: Judgment normal.     Results for orders placed or performed in visit on 11/24/23  OPHTHALMOLOGY REPORT-SCANNED   Collection Time: 11/23/23 10:46 AM  Result Value Ref Range   HM Diabetic Eye Exam No Retinopathy No  Retinopathy   A Comment        Assessment & Plan:   Problem List Items Addressed This Visit       Respiratory   COPD (chronic obstructive pulmonary disease) (HCC)   Relevant Orders   Ambulatory referral to Home Health     Endocrine   Type 2 diabetes mellitus with renal complication (HCC)   Relevant Orders   Ambulatory referral to Home Health   Hyperlipidemia associated with type 2 diabetes mellitus (HCC)   Relevant Medications   carvedilol (COREG) 12.5 MG tablet   Other Relevant Orders   Ambulatory referral to Home Health     Nervous and Auditory   Chronic dementia without behavioral disturbance (HCC)   Relevant Orders   Ambulatory referral to Home Health     Genitourinary   Benign hypertensive renal disease   Relevant Orders   Ambulatory referral to Home Health   Microalbumin, Urine Waived   Other Visit Diagnoses       Community acquired pneumonia of right middle lobe of lung    -  Primary   Will send for CXR and treat with augmentin . Recheck 2 weeks. Call with any concerns. Will get home health out.   Relevant Medications   amoxicillin -clavulanate (AUGMENTIN ) 875-125 MG tablet   Other Relevant Orders   DG Chest 2 View   Ambulatory referral to Home Health     Witnessed episode of apnea       Will check sleep stufy. Await results. Treat as needed.   Relevant Orders   Ambulatory referral to Sleep Studies   Ambulatory referral to Home Health        Follow up plan: Return in about 2 weeks (around 12/12/2023) for OK to double book.

## 2023-11-29 ENCOUNTER — Ambulatory Visit: Payer: Self-pay | Admitting: Family Medicine

## 2023-12-02 DIAGNOSIS — Z7952 Long term (current) use of systemic steroids: Secondary | ICD-10-CM | POA: Diagnosis not present

## 2023-12-02 DIAGNOSIS — I129 Hypertensive chronic kidney disease with stage 1 through stage 4 chronic kidney disease, or unspecified chronic kidney disease: Secondary | ICD-10-CM | POA: Diagnosis not present

## 2023-12-02 DIAGNOSIS — Z79899 Other long term (current) drug therapy: Secondary | ICD-10-CM | POA: Diagnosis not present

## 2023-12-02 DIAGNOSIS — N183 Chronic kidney disease, stage 3 unspecified: Secondary | ICD-10-CM | POA: Diagnosis not present

## 2023-12-02 DIAGNOSIS — E1169 Type 2 diabetes mellitus with other specified complication: Secondary | ICD-10-CM | POA: Diagnosis not present

## 2023-12-02 DIAGNOSIS — Z7984 Long term (current) use of oral hypoglycemic drugs: Secondary | ICD-10-CM | POA: Diagnosis not present

## 2023-12-02 DIAGNOSIS — Z7983 Long term (current) use of bisphosphonates: Secondary | ICD-10-CM | POA: Diagnosis not present

## 2023-12-02 DIAGNOSIS — Z9181 History of falling: Secondary | ICD-10-CM | POA: Diagnosis not present

## 2023-12-02 DIAGNOSIS — Z794 Long term (current) use of insulin: Secondary | ICD-10-CM | POA: Diagnosis not present

## 2023-12-02 DIAGNOSIS — Z7951 Long term (current) use of inhaled steroids: Secondary | ICD-10-CM | POA: Diagnosis not present

## 2023-12-02 DIAGNOSIS — J449 Chronic obstructive pulmonary disease, unspecified: Secondary | ICD-10-CM | POA: Diagnosis not present

## 2023-12-02 DIAGNOSIS — J189 Pneumonia, unspecified organism: Secondary | ICD-10-CM | POA: Diagnosis not present

## 2023-12-02 DIAGNOSIS — E785 Hyperlipidemia, unspecified: Secondary | ICD-10-CM | POA: Diagnosis not present

## 2023-12-02 DIAGNOSIS — F32A Depression, unspecified: Secondary | ICD-10-CM | POA: Diagnosis not present

## 2023-12-02 DIAGNOSIS — E1122 Type 2 diabetes mellitus with diabetic chronic kidney disease: Secondary | ICD-10-CM | POA: Diagnosis not present

## 2023-12-02 DIAGNOSIS — F0393 Unspecified dementia, unspecified severity, with mood disturbance: Secondary | ICD-10-CM | POA: Diagnosis not present

## 2023-12-06 ENCOUNTER — Ambulatory Visit: Payer: Self-pay

## 2023-12-06 ENCOUNTER — Emergency Department

## 2023-12-06 ENCOUNTER — Other Ambulatory Visit: Payer: Self-pay

## 2023-12-06 ENCOUNTER — Emergency Department
Admission: EM | Admit: 2023-12-06 | Discharge: 2023-12-06 | Disposition: A | Discharge status: Home / Self Care | Attending: Emergency Medicine | Admitting: Emergency Medicine

## 2023-12-06 DIAGNOSIS — N189 Chronic kidney disease, unspecified: Secondary | ICD-10-CM | POA: Insufficient documentation

## 2023-12-06 DIAGNOSIS — J168 Pneumonia due to other specified infectious organisms: Secondary | ICD-10-CM | POA: Insufficient documentation

## 2023-12-06 DIAGNOSIS — I130 Hypertensive heart and chronic kidney disease with heart failure and stage 1 through stage 4 chronic kidney disease, or unspecified chronic kidney disease: Secondary | ICD-10-CM | POA: Diagnosis not present

## 2023-12-06 DIAGNOSIS — Z7951 Long term (current) use of inhaled steroids: Secondary | ICD-10-CM | POA: Insufficient documentation

## 2023-12-06 DIAGNOSIS — Z794 Long term (current) use of insulin: Secondary | ICD-10-CM | POA: Insufficient documentation

## 2023-12-06 DIAGNOSIS — E1122 Type 2 diabetes mellitus with diabetic chronic kidney disease: Secondary | ICD-10-CM | POA: Insufficient documentation

## 2023-12-06 DIAGNOSIS — I110 Hypertensive heart disease with heart failure: Secondary | ICD-10-CM | POA: Diagnosis not present

## 2023-12-06 DIAGNOSIS — R600 Localized edema: Secondary | ICD-10-CM | POA: Diagnosis not present

## 2023-12-06 DIAGNOSIS — J449 Chronic obstructive pulmonary disease, unspecified: Secondary | ICD-10-CM | POA: Diagnosis not present

## 2023-12-06 DIAGNOSIS — Z79899 Other long term (current) drug therapy: Secondary | ICD-10-CM | POA: Insufficient documentation

## 2023-12-06 DIAGNOSIS — J189 Pneumonia, unspecified organism: Secondary | ICD-10-CM

## 2023-12-06 DIAGNOSIS — I509 Heart failure, unspecified: Secondary | ICD-10-CM | POA: Diagnosis not present

## 2023-12-06 DIAGNOSIS — R059 Cough, unspecified: Secondary | ICD-10-CM | POA: Diagnosis not present

## 2023-12-06 DIAGNOSIS — J90 Pleural effusion, not elsewhere classified: Secondary | ICD-10-CM | POA: Diagnosis not present

## 2023-12-06 DIAGNOSIS — R0989 Other specified symptoms and signs involving the circulatory and respiratory systems: Secondary | ICD-10-CM | POA: Diagnosis not present

## 2023-12-06 DIAGNOSIS — Z8673 Personal history of transient ischemic attack (TIA), and cerebral infarction without residual deficits: Secondary | ICD-10-CM | POA: Diagnosis not present

## 2023-12-06 DIAGNOSIS — R052 Subacute cough: Secondary | ICD-10-CM | POA: Diagnosis not present

## 2023-12-06 DIAGNOSIS — I517 Cardiomegaly: Secondary | ICD-10-CM | POA: Diagnosis not present

## 2023-12-06 LAB — CBC WITH DIFFERENTIAL/PLATELET
Abs Immature Granulocytes: 0.04 K/uL (ref 0.00–0.07)
Basophils Absolute: 0 K/uL (ref 0.0–0.1)
Basophils Relative: 0 %
Eosinophils Absolute: 0.2 K/uL (ref 0.0–0.5)
Eosinophils Relative: 2 %
HCT: 34.9 % — ABNORMAL LOW (ref 36.0–46.0)
Hemoglobin: 11.8 g/dL — ABNORMAL LOW (ref 12.0–15.0)
Immature Granulocytes: 0 %
Lymphocytes Relative: 10 %
Lymphs Abs: 0.9 K/uL (ref 0.7–4.0)
MCH: 32 pg (ref 26.0–34.0)
MCHC: 33.8 g/dL (ref 30.0–36.0)
MCV: 94.6 fL (ref 80.0–100.0)
Monocytes Absolute: 0.7 K/uL (ref 0.1–1.0)
Monocytes Relative: 8 %
Neutro Abs: 7.1 K/uL (ref 1.7–7.7)
Neutrophils Relative %: 80 %
Platelets: 160 K/uL (ref 150–400)
RBC: 3.69 MIL/uL — ABNORMAL LOW (ref 3.87–5.11)
RDW: 13.8 % (ref 11.5–15.5)
WBC: 9 K/uL (ref 4.0–10.5)
nRBC: 0 % (ref 0.0–0.2)

## 2023-12-06 LAB — COMPREHENSIVE METABOLIC PANEL WITH GFR
ALT: 47 U/L — ABNORMAL HIGH (ref 0–44)
AST: 30 U/L (ref 15–41)
Albumin: 3.9 g/dL (ref 3.5–5.0)
Alkaline Phosphatase: 86 U/L (ref 38–126)
Anion gap: 10 (ref 5–15)
BUN: 23 mg/dL (ref 8–23)
CO2: 25 mmol/L (ref 22–32)
Calcium: 9 mg/dL (ref 8.9–10.3)
Chloride: 98 mmol/L (ref 98–111)
Creatinine, Ser: 0.99 mg/dL (ref 0.44–1.00)
GFR, Estimated: 51 mL/min — ABNORMAL LOW (ref >60)
Glucose, Bld: 195 mg/dL — ABNORMAL HIGH (ref 70–99)
Potassium: 4 mmol/L (ref 3.5–5.1)
Sodium: 133 mmol/L — ABNORMAL LOW (ref 135–145)
Total Bilirubin: 0.4 mg/dL (ref 0.0–1.2)
Total Protein: 6.9 g/dL (ref 6.5–8.1)

## 2023-12-06 LAB — RESP PANEL BY RT-PCR (RSV, FLU A&B, COVID)  RVPGX2
Influenza A by PCR: NEGATIVE
Influenza B by PCR: NEGATIVE
Resp Syncytial Virus by PCR: NEGATIVE
SARS Coronavirus 2 by RT PCR: NEGATIVE

## 2023-12-06 LAB — LACTIC ACID, PLASMA
Lactic Acid, Venous: 1.3 mmol/L (ref 0.5–1.9)
Lactic Acid, Venous: 1.6 mmol/L (ref 0.5–1.9)

## 2023-12-06 LAB — PRO BRAIN NATRIURETIC PEPTIDE: Pro Brain Natriuretic Peptide: 4567 pg/mL — ABNORMAL HIGH (ref <300.0)

## 2023-12-06 MED ORDER — DOXYCYCLINE HYCLATE 100 MG PO TABS
100.0000 mg | ORAL_TABLET | Freq: Once | ORAL | Status: AC
  Administered 2023-12-06: 100 mg via ORAL
  Filled 2023-12-06: qty 1

## 2023-12-06 MED ORDER — HYDROCOD POLI-CHLORPHE POLI ER 10-8 MG/5ML PO SUER: 2.0000 mL | Freq: Every evening | ORAL | 0 refills | Status: AC | PRN

## 2023-12-06 MED ORDER — METHYLPREDNISOLONE SODIUM SUCC 125 MG IJ SOLR
125.0000 mg | Freq: Once | INTRAMUSCULAR | Status: AC
  Administered 2023-12-06: 125 mg via INTRAVENOUS
  Filled 2023-12-06: qty 2

## 2023-12-06 MED ORDER — DOXYCYCLINE HYCLATE 100 MG PO TABS: 100.0000 mg | ORAL_TABLET | Freq: Two times a day (BID) | ORAL | 0 refills | Status: AC

## 2023-12-06 MED ORDER — IPRATROPIUM-ALBUTEROL 0.5-2.5 (3) MG/3ML IN SOLN
3.0000 mL | Freq: Once | RESPIRATORY_TRACT | Status: AC
  Administered 2023-12-06: 3 mL via RESPIRATORY_TRACT
  Filled 2023-12-06: qty 3

## 2023-12-06 MED ORDER — FUROSEMIDE 20 MG PO TABS: 20.0000 mg | ORAL_TABLET | Freq: Every day | ORAL | 0 refills | Status: AC

## 2023-12-06 MED ORDER — SODIUM CHLORIDE 0.9 % IV SOLN
2.0000 g | Freq: Once | INTRAVENOUS | Status: AC
  Administered 2023-12-06: 2 g via INTRAVENOUS
  Filled 2023-12-06: qty 20

## 2023-12-06 NOTE — TOC Transition Note (Signed)
 Transition of Care [facility]) - Discharge Note   Patient Details  Name: BLENDA WISECUP MRN: [id number] Date of Birth: 1921/08/21  Transition [facility]) CM/SW Contact:  Nathanael CHRISTELLA Ring, RN Phone Number: 12/06/2023, 4:00 PM   Clinical Narrative:    Patient is to discharge home with hospice services.  Family chooses [facility].  Marinell with [facility] care given referral.  He will run down to the ED to speak with patient and family.  Plan is to discharge home this evening.     Final next level of care: Home w Hospice Care Barriers to Discharge: Barriers Resolved   Patient Goals and CMS Choice Patient states their goals for this hospitalization and ongoing recovery are:: Family wants to take the patient home with hospice CMS Medicare.gov Compare Post Acute Care list provided to:: Patient Represenative (must comment) Choice offered to / list presented to : Adult Children      Discharge Placement                       Discharge Plan and Services Additional resources added to the After Visit Summary for                  DME Arranged: N/A         HH Arranged: NA          Social Drivers of Health (SDOH) Interventions SDOH Screenings   Food Insecurity: No Food Insecurity (01/25/2023)  Housing: Low Risk  (01/25/2023)  Transportation Needs: No Transportation Needs (01/25/2023)  Utilities: Not At Risk (01/25/2023)  Alcohol Screen: Low Risk  (01/25/2023)  Depression (PHQ2-9): High Risk (08/25/2023)  Financial Resource Strain: Low Risk  (01/25/2023)  Physical Activity: Inactive (01/25/2023)  Social Connections: Socially Isolated (01/25/2023)  Tobacco Use: Medium Risk (11/28/2023)  Health Literacy: Inadequate Health Literacy (01/25/2023)     Readmission Risk Interventions     No data to display

## 2023-12-06 NOTE — Consult Note (Signed)
 Initial Consultation Note   Patient: Angela Neal FMW:[id number] DOB: Dec 22, 1921 PCP: Vicci Duwaine SQUIBB, DO DOA: 12/06/2023 DOS: the patient was seen and examined on 12/06/2023 Primary service: Ernest Ronal BRAVO, MD  Referring physician: Dr. Hobert Reason for consult: Family was overwhelmed for her cough  Assessment/Plan: Assessment and Plan: 89 year old frail lady who was having cough for the past few weeks, was treated with Augmentin  for concern of pneumonia, no leukocytosis, fever and chest x-ray was not consistent with pneumonia.  Patient was on room air.  She does become little short of breath with coughing spells.  And family was very overwhelmed.  The called the PCP and PCP asked them to bring her to ED without even seeing her. - No need to bring her in. - We can try a course of doxycycline and supportive care with symptom management -A referral for home hospice services to provide some extra help for the overwhelming family.   TRH will sign off at present, please call us  again when needed.  HPI: Angela Neal is a 89 y.o. female with past medical history of COPD, diabetes, hypertension was brought to ED with concern of persistent cough after being treated with Augmentin  for concern of pneumonia last week.  Patient does become little short of breath with coughing spells.  Family was very overwhelmed.  They called her PCP and PCP advised to come to ED.  She was not seen by or evaluated by PCP at office.  No fever.  No other symptoms except cough and mild congestion.  Patient was having decreased p.o. intake which is going on for some time.  Yeah question  Family was overwhelmed as her daughter is also taking care of her sick husband.  On presentation patient was hemodynamically stable, on room air.  Labs at baseline.  Chest x-ray with mild bilateral pleural effusion unchanged.  No concern of infiltrate.  Review of Systems: As mentioned in the history of present illness. All other  systems reviewed and are negative. Past Medical History:  Diagnosis Date   Asthma    COPD (chronic obstructive pulmonary disease) (HCC)    Diabetes mellitus without complication (HCC)    Hypertension    Macular degeneration    Stroke [facility])    Past Surgical History:  Procedure Laterality Date   ABDOMINAL HYSTERECTOMY     HIP ARTHROPLASTY     Social History:  reports that she quit smoking about 70 years ago. Her smoking use included cigarettes. She started smoking about 80 years ago. She has a 12.5 pack-year smoking history. She has never used smokeless tobacco. She reports that she does not drink alcohol and does not use drugs.  Allergies  Allergen Reactions   Sulfa Antibiotics Anaphylaxis   Amlodipine  Other (See Comments)    Leg swelling    Family History  Problem Relation Age of Onset   Arthritis Mother    Heart disease Mother    Heart attack Father 23    Prior to Admission medications   Medication Sig Start Date End Date Taking? Authorizing Provider  chlorpheniramine-HYDROcodone (TUSSIONEX) 10-8 MG/5ML Take 2 mLs by mouth at bedtime as needed for up to 8 days for cough. 12/06/23 12/14/23 Yes Ernest Ronal BRAVO, MD  doxycycline (VIBRA-TABS) 100 MG tablet Take 1 tablet (100 mg total) by mouth 2 (two) times daily for 7 days. 12/06/23 12/13/23 Yes Ernest Ronal BRAVO, MD  albuterol  (ACCUNEB ) 0.63 MG/3ML nebulizer solution Take 3 mLs (0.63 mg total) by nebulization every 6 (six)  hours as needed for wheezing. 11/14/23   Johnson, Megan P, DO  albuterol  (VENTOLIN  HFA) 108 (90 Base) MCG/ACT inhaler Inhale 1-2 puffs into the lungs every 6 (six) hours as needed for wheezing or shortness of breath. 11/14/23   Johnson, Megan P, DO  alendronate  (FOSAMAX ) 70 MG tablet Take 1 tablet (70 mg total) by mouth once a week. Take with a full glass of water on an empty stomach. 11/14/23   Vicci Bouchard P, DO  amoxicillin -clavulanate (AUGMENTIN ) 875-125 MG tablet Take 1 tablet by mouth 2 (two) times daily.  11/28/23   Johnson, Megan P, DO  atorvastatin  (LIPITOR) 40 MG tablet Take 1 tablet (40 mg total) by mouth daily. 11/14/23   Johnson, Megan P, DO  budesonide -formoterol  (SYMBICORT ) 80-4.5 MCG/ACT inhaler Inhale 2 puffs into the lungs 2 (two) times daily. 08/25/23   Vicci Bouchard P, DO  carvedilol (COREG) 12.5 MG tablet Take 12.5 mg by mouth. 11/23/23   [provider]  Cholecalciferol (GNP VITAMIN D3 EXTRA STRENGTH) 25 MCG (1000 UT) tablet Take 1 tablet (1,000 Units total) by mouth daily. 05/19/23   Vicci Bouchard P, DO  Continuous Glucose Receiver (FREESTYLE LIBRE 3 READER) DEVI Use 1 each as directed 12/22/22   [provider]  fluticasone  (FLONASE ) 50 MCG/ACT nasal spray SPRAY 2 SPRAYS INTO EACH NOSTRIL EVERY DAY *NEW PRESCRIPTION REQUEST* 05/14/23   Cannady, Jolene T, NP  GNP NATURAL FIBER 0.52 g capsule TAKE 1 CAPSULE BY MOUTH ONCE DAILY AT NOON 03/19/22   Johnson, Megan P, DO  hydrALAZINE  (APRESOLINE ) 10 MG tablet Take 3 tablets (30 mg total) by mouth 3 (three) times daily. 11/14/23   Johnson, Megan P, DO  hydrochlorothiazide  (HYDRODIURIL ) 25 MG tablet Take 1 tablet (25 mg total) by mouth daily. 11/14/23   Johnson, Megan P, DO  insulin  degludec (TRESIBA  FLEXTOUCH) 100 UNIT/ML FlexTouch Pen Inject 10 Units into the skin daily. 11/14/23   Johnson, Megan P, DO  LEVEMIR  FLEXTOUCH 100 UNIT/ML FlexTouch Pen Inject 8 Units into the skin at bedtime. 08/17/22 11/28/23  Thaddeus Hyla Givens, NP  losartan  (COZAAR ) 100 MG tablet Take 1 tablet (100 mg total) by mouth daily. 11/14/23   Johnson, Megan P, DO  mirtazapine  (REMERON ) 45 MG tablet Take 1 tablet (45 mg total) by mouth at bedtime. 11/14/23   Johnson, Megan P, DO  montelukast  (SINGULAIR ) 10 MG tablet Take 1 tablet (10 mg total) by mouth at bedtime. 11/14/23   Johnson, Megan P, DO  Multiple Vitamins-Minerals (GNP HEALTHY EYES SUPERVISION 2) CAPS Take 1 capsule by mouth 2 (two) times daily. 05/20/23   Johnson, Megan P, DO  NOVOLOG   FLEXPEN 100 UNIT/ML FlexPen Inject 10 Units into the skin 3 (three) times daily with meals. 11/14/23   Johnson, Megan P, DO  predniSONE  (DELTASONE ) 10 MG tablet 6 tabs days 1 and 2, 5 tabs days 3 and 4, decrease by 1 every other day until gone. 11/14/23   Johnson, Megan P, DO  sertraline  (ZOLOFT ) 50 MG tablet Take 1.5 tablets (75 mg total) by mouth daily. 11/14/23   Johnson, Megan P, DO  sitaGLIPtin  (JANUVIA ) 100 MG tablet Take 1 tablet (100 mg total) by mouth daily. 11/14/23   Vicci Bouchard SQUIBB, DO  Spacer/Aero-Holding Raguel FRENCH Use as directed with inhaler. Dx: cough 04/29/23   Johnson, Megan P, DO  Tiotropium Bromide  Monohydrate 2.5 MCG/ACT AERS Inhale 3 puffs into the lungs 2 (two) times daily as needed. 07/20/22 11/28/23  [provider]  TRUE METRIX BLOOD GLUCOSE TEST test  strip TEST BLOOD SUGAR 4 TIMES DAILY BEFORE LEVEMIR  IS GIVEN OR AS DIRECTED BY PHYSICIAN 04/10/21   Johnson, Megan P, DO  TRUEplus Safety Lancets 28G MISC USE FOR FINGER STICK BLOOD SUGARS (CURRENTLY 4 TIMES DAILY) 06/19/21   Johnson, Megan P, DO  ULTRACARE PEN NEEDLES 33G X 4 MM MISC USE WITH INSULIN  PENS AS DIRECTED BY PHYSICIAN (CURRENTLY 4 TIMES DAILY) 03/25/22   Vicci Duwaine SQUIBB, DO    Physical Exam: Vitals:   12/06/23 1230 12/06/23 1231  BP: 112/67   Pulse: 70   Resp: 18   Temp: (!) 97.5 F (36.4 C)   TempSrc: Oral   SpO2: 96%   Weight:  46.7 kg  Height:  4' 10 (1.473 m)   General.  Very fragile elderly lady, in no acute distress. Pulmonary.  Lungs clear bilaterally, normal respiratory effort. CV.  Regular rate and rhythm, positive murmur Abdomen.  Soft, nontender, nondistended, BS positive. CNS.  Alert and oriented to self.  No focal neurologic deficit. Extremities.  No edema,  pulses intact and symmetrical.   Data Reviewed:  Prior data reviewed  Family Communication: Discussed with daughter at bedside Primary team communication: Discussed with EDP provider Thank you very much for involving  us  in the care of your patient.  Author: Amaryllis Dare, MD 12/06/2023 3:19 PM  For on call review [email or web address].

## 2023-12-06 NOTE — Progress Notes (Signed)
 [facility] Room ED [facility] Liaison Note  Referral received from Gina in the [facility] for hospice services in the home.  Patient lives with her daughter who provides care for patient.  Patient has a hospital bed, BSC, WC, Shower chair, Walker.  No additional DME needed, according to daughter.     Referral submitted today.  Please call with any hospice related questions or concerns.  Thank you for the opportunity to participate in this patient's care.  [facility] Liaison [phone]

## 2023-12-06 NOTE — Discharge Instructions (Addendum)
 We are starting her on additional antibiotic to take.  Use the Tussionex only at nighttime to help with the cough.  This can lead to increase of fall so only use it if you have to.  We are starting at a very low dose.  Only give the Lasix  in the morning if her blood pressure is over 140 systolic.  If there is a little bit of fluid buildup that is causing her her coughing this could help but it can make people pee for over 6 hours so we want to do this in the morning.  The case worker should be putting a referral in for hospice to help you get you more help at home.    If he she develops worsening symptoms or other concerns then please return to the ER for repeat evaluation

## 2023-12-06 NOTE — Telephone Encounter (Signed)
 FYI Only or Action Required?: FYI only for provider: ED advised.  Patient was last seen in primary care on 11/28/2023 by Vicci Duwaine SQUIBB, DO.  Called Nurse Triage reporting Cough.  Symptoms began several weeks ago.  Interventions attempted: Prescription medications: Prednisone , Amoxicillin , Nebulizer Treatments and Rest, hydration, or home remedies.  Symptoms are: gradually worsening.  Triage Disposition: Go to ED Now (or PCP Triage)  Patient/caregiver understands and will follow disposition?: Yes, daughter will transport to [facility] ED.     Copied from CRM #[id number]. Topic: Clinical - Red Word Triage >> Dec 06, 2023 10:16 AM Ahlexyia S wrote: Red Word that prompted transfer to Nurse Triage: Pt daughter Elveria called in stating that pt is having a worsening cough. Elveria stated that pt is coughing more than usual and she struggles to breath. Elveria also mentioned that pt has COPD. Pt is on a course of antibiotics but Elveria doesn't think they are working considering pt current symptoms. Warm transferred to nurse triage. Reason for Disposition  MODERATE difficulty breathing (e.g., speaks in phrases, SOB even at rest, pulse 100-120)  Answer Assessment - Initial Assessment Questions Pt's daughter Elveria reports pt diagnosed with Pneumonia 11/24 and continues Amoxil , Prednisone  and Neb treatments as prescribed, continues with worsening cough, SOB with exertion, coughing and neb treatments, audible wheeze confirmed by [facility] nurse on Friday. This RN advised ED due to worsening symptoms, SOB, wheezing. Pt's daughter agreeable and will transport.     1. SYMPTOM: What's the main symptom you're concerned about? (e.g., breathing difficulty, fever, weakness)     Cough 2. ONSET: When did the cough start?     Ongoing, Dx with pneumonia 11/24 3. BETTER-SAME-WORSE: Are you getting better, staying the same, or getting worse compared to the day you were last seen?     Same-Worsening 4.  BREATHING DIFFICULTY: Are you having any difficulty breathing? If Yes, ask: How bad is it?  (e.g., none, mild, moderate, severe)      SOB with exertion, coughing fits, with breathing treatments 5. FEVER: Do you have a fever? If Yes, ask: What is your temperature, how was it measured, and when did it start?     Deferred 6. SPUTUM: Describe the color of your sputum (clear, white, yellow, green, blood-tinged)     Deferred 7. DIAGNOSIS CONFIRMATION: When was the pneumonia diagnosed? By whom?     PCP 8. ANTIBIOTIC: Are you taking an antibiotic?  If Yes, ask: Which one? When was it started?     Yes, Amoxicillin  9. OTHER TREATMENT: Are you receiving any other treatment for the pneumonia? (e.g., albuterol  nebulizer, oxygen) If Yes, ask: How often? and Does it help?     Nebulizer, Prednisone  10. HOSPITAL ADMISSION: Were you hospitalized for this pneumonia? If Yes, ask: When were you discharged home from the hospital?       Noe 11. O2 SATURATION MONITOR:  Do you use an oxygen saturation monitor (pulse oximeter) at home? If Yes, What is your reading (oxygen level) today? What is your usual oxygen saturation reading? (e.g., 95%)       Deferred  Protocols used: Pneumonia Follow-up Call-A-AH
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
## 2023-12-06 NOTE — Telephone Encounter (Signed)
 Agree with ED

## 2023-12-06 NOTE — ED Triage Notes (Signed)
 Pt was diagnosed with pneumonia on 11/24. Pt has been on amoxicillin  and steroids but has not improved. Daughter called her pcp and was told to bring her to ER. Pt has non productive cough and feeling SoB.

## 2023-12-12 ENCOUNTER — Ambulatory Visit: Admitting: Family Medicine

## 2023-12-12 DIAGNOSIS — E785 Hyperlipidemia, unspecified: Secondary | ICD-10-CM | POA: Diagnosis not present

## 2023-12-12 DIAGNOSIS — Z7951 Long term (current) use of inhaled steroids: Secondary | ICD-10-CM | POA: Diagnosis not present

## 2023-12-12 DIAGNOSIS — Z7984 Long term (current) use of oral hypoglycemic drugs: Secondary | ICD-10-CM | POA: Diagnosis not present

## 2023-12-12 DIAGNOSIS — J189 Pneumonia, unspecified organism: Secondary | ICD-10-CM | POA: Diagnosis not present

## 2023-12-12 DIAGNOSIS — Z7952 Long term (current) use of systemic steroids: Secondary | ICD-10-CM | POA: Diagnosis not present

## 2023-12-12 DIAGNOSIS — F32A Depression, unspecified: Secondary | ICD-10-CM | POA: Diagnosis not present

## 2023-12-12 DIAGNOSIS — N183 Chronic kidney disease, stage 3 unspecified: Secondary | ICD-10-CM | POA: Diagnosis not present

## 2023-12-12 DIAGNOSIS — J449 Chronic obstructive pulmonary disease, unspecified: Secondary | ICD-10-CM | POA: Diagnosis not present

## 2023-12-12 DIAGNOSIS — F0393 Unspecified dementia, unspecified severity, with mood disturbance: Secondary | ICD-10-CM | POA: Diagnosis not present

## 2023-12-12 DIAGNOSIS — E1122 Type 2 diabetes mellitus with diabetic chronic kidney disease: Secondary | ICD-10-CM | POA: Diagnosis not present

## 2023-12-12 DIAGNOSIS — I129 Hypertensive chronic kidney disease with stage 1 through stage 4 chronic kidney disease, or unspecified chronic kidney disease: Secondary | ICD-10-CM | POA: Diagnosis not present

## 2023-12-12 DIAGNOSIS — Z9181 History of falling: Secondary | ICD-10-CM | POA: Diagnosis not present

## 2023-12-15 ENCOUNTER — Telehealth: Payer: Self-pay | Admitting: Family Medicine

## 2023-12-15 NOTE — Telephone Encounter (Signed)
 Copied from CRM #[id number]. Topic: General - Other >> Dec 15, 2023  1:08 PM Emylou G wrote: Reason for CRM: Apolinar w/[facility] called.. said she rcvd the fax back about the plan of care orders, but she said it must be signed by her PCP Duwaine Louder.  She is refaxing the form back to us  to correct

## 2023-12-15 NOTE — Telephone Encounter (Signed)
Forms have been corrected and re faxed

## 2023-12-16 ENCOUNTER — Other Ambulatory Visit: Payer: Self-pay | Admitting: Family Medicine

## 2023-12-19 NOTE — Telephone Encounter (Signed)
 Too soon for refill.  Requested Prescriptions  Pending Prescriptions Disp Refills   hydrALAZINE  (APRESOLINE ) 10 MG tablet [Pharmacy Med Name: HYDRALAZINE  10 MG TABLET] 810 tablet 1    Sig: TAKE 3 TABLETS (30 MG TOTAL) BY MOUTH 3 (THREE) TIMES DAILY.     Cardiovascular:  Vasodilators Failed - 12/19/2023  4:28 PM      Failed - HCT in normal range and within 360 days    HCT  Date Value Ref Range Status  12/06/2023 34.9 (L) 36.0 - 46.0 % Final   Hematocrit  Date Value Ref Range Status  11/14/2023 38.9 34.0 - 46.6 % Final         Failed - HGB in normal range and within 360 days    Hemoglobin  Date Value Ref Range Status  12/06/2023 11.8 (L) 12.0 - 15.0 g/dL Final  88/89/7974 86.9 11.1 - 15.9 g/dL Final         Failed - RBC in normal range and within 360 days    RBC  Date Value Ref Range Status  12/06/2023 3.69 (L) 3.87 - 5.11 MIL/uL Final         Failed - ANA Screen, Ifa, Serum in normal range and within 360 days    No results found for: ANA, ANATITER, LABANTI       Failed - Last BP in normal range    BP Readings from Last 1 Encounters:  12/06/23 (!) 179/68         Passed - WBC in normal range and within 360 days    WBC  Date Value Ref Range Status  12/06/2023 9.0 4.0 - 10.5 K/uL Final         Passed - PLT in normal range and within 360 days    Platelets  Date Value Ref Range Status  12/06/2023 160 150 - 400 K/uL Final  11/14/2023 173 150 - 450 x10E3/uL Final         Passed - Valid encounter within last 12 months    Recent Outpatient Visits           3 weeks ago Community acquired pneumonia of right middle lobe of lung   [place] [facility] [place], Megan P, DO   1 month ago Routine general medical examination at a health care facility   [facility], Megan P, DO   3 months ago Chronic obstructive pulmonary disease, unspecified COPD type [facility])   [place] [facility] [place], Megan P, DO    7 months ago Chronic dementia without behavioral disturbance [facility])   [place] [facility] [place], Megan P, DO   7 months ago Benign hypertensive renal disease   [place] [facility] [place], [place], DO
